# Patient Record
Sex: Female | Born: 1950 | Race: White | Hispanic: No | Marital: Married | Smoking: Former smoker
Health system: Southern US, Community
[De-identification: ages and names within clinical notes are randomized; demographics above are authoritative.]

## PROBLEM LIST (undated history)

## (undated) DIAGNOSIS — IMO0002 Reserved for concepts with insufficient information to code with codable children: Secondary | ICD-10-CM

## (undated) DIAGNOSIS — R911 Solitary pulmonary nodule: Secondary | ICD-10-CM

## (undated) DIAGNOSIS — E785 Hyperlipidemia, unspecified: Secondary | ICD-10-CM

## (undated) DIAGNOSIS — I10 Essential (primary) hypertension: Secondary | ICD-10-CM

## (undated) DIAGNOSIS — N200 Calculus of kidney: Secondary | ICD-10-CM

## (undated) DIAGNOSIS — K219 Gastro-esophageal reflux disease without esophagitis: Secondary | ICD-10-CM

## (undated) DIAGNOSIS — T4145XA Adverse effect of unspecified anesthetic, initial encounter: Secondary | ICD-10-CM

## (undated) DIAGNOSIS — T8859XA Other complications of anesthesia, initial encounter: Secondary | ICD-10-CM

## (undated) DIAGNOSIS — K449 Diaphragmatic hernia without obstruction or gangrene: Secondary | ICD-10-CM

## (undated) DIAGNOSIS — K7581 Nonalcoholic steatohepatitis (NASH): Secondary | ICD-10-CM

## (undated) DIAGNOSIS — R7989 Other specified abnormal findings of blood chemistry: Secondary | ICD-10-CM

## (undated) DIAGNOSIS — I5189 Other ill-defined heart diseases: Secondary | ICD-10-CM

## (undated) DIAGNOSIS — I700 Atherosclerosis of aorta: Secondary | ICD-10-CM

## (undated) DIAGNOSIS — R945 Abnormal results of liver function studies: Secondary | ICD-10-CM

## (undated) DIAGNOSIS — R112 Nausea with vomiting, unspecified: Secondary | ICD-10-CM

## (undated) DIAGNOSIS — L719 Rosacea, unspecified: Secondary | ICD-10-CM

## (undated) DIAGNOSIS — Z9889 Other specified postprocedural states: Secondary | ICD-10-CM

## (undated) HISTORY — DX: Solitary pulmonary nodule: R91.1

## (undated) HISTORY — PX: SPINE SURGERY: SHX786

## (undated) HISTORY — DX: Rosacea, unspecified: L71.9

## (undated) HISTORY — DX: Calculus of kidney: N20.0

## (undated) HISTORY — PX: CHOLECYSTECTOMY: SHX55

## (undated) HISTORY — DX: Reserved for concepts with insufficient information to code with codable children: IMO0002

## (undated) HISTORY — DX: Nonalcoholic steatohepatitis (NASH): K75.81

## (undated) HISTORY — PX: DILATION AND CURETTAGE, DIAGNOSTIC / THERAPEUTIC: SUR384

## (undated) HISTORY — DX: Gastro-esophageal reflux disease without esophagitis: K21.9

## (undated) HISTORY — DX: Essential (primary) hypertension: I10

## (undated) HISTORY — DX: Hyperlipidemia, unspecified: E78.5

## (undated) HISTORY — DX: Other ill-defined heart diseases: I51.89

## (undated) HISTORY — PX: ESOPHAGOGASTRODUODENOSCOPY: SHX1529

## (undated) HISTORY — PX: BREAST SURGERY: SHX581

## (undated) HISTORY — DX: Other specified abnormal findings of blood chemistry: R79.89

## (undated) HISTORY — DX: Diaphragmatic hernia without obstruction or gangrene: K44.9

## (undated) HISTORY — PX: FRACTURE SURGERY: SHX138

## (undated) HISTORY — DX: Atherosclerosis of aorta: I70.0

## (undated) HISTORY — DX: Abnormal results of liver function studies: R94.5

---

## 1998-03-30 ENCOUNTER — Emergency Department (HOSPITAL_COMMUNITY): Admission: EM | Admit: 1998-03-30 | Discharge: 1998-03-30 | Payer: Self-pay | Admitting: Emergency Medicine

## 1999-02-13 ENCOUNTER — Other Ambulatory Visit: Admission: RE | Admit: 1999-02-13 | Discharge: 1999-02-13 | Payer: Self-pay | Admitting: Obstetrics and Gynecology

## 1999-03-17 ENCOUNTER — Ambulatory Visit (HOSPITAL_COMMUNITY): Admission: RE | Admit: 1999-03-17 | Discharge: 1999-03-17 | Payer: Self-pay | Admitting: Internal Medicine

## 1999-03-17 ENCOUNTER — Encounter: Payer: Self-pay | Admitting: Internal Medicine

## 1999-04-14 ENCOUNTER — Encounter: Payer: Self-pay | Admitting: Surgery

## 1999-04-14 ENCOUNTER — Ambulatory Visit (HOSPITAL_COMMUNITY): Admission: RE | Admit: 1999-04-14 | Discharge: 1999-04-14 | Payer: Self-pay | Admitting: Surgery

## 2000-02-26 ENCOUNTER — Other Ambulatory Visit: Admission: RE | Admit: 2000-02-26 | Discharge: 2000-02-26 | Payer: Self-pay | Admitting: Obstetrics and Gynecology

## 2001-07-07 ENCOUNTER — Other Ambulatory Visit: Admission: RE | Admit: 2001-07-07 | Discharge: 2001-07-07 | Payer: Self-pay | Admitting: Obstetrics and Gynecology

## 2001-07-08 ENCOUNTER — Ambulatory Visit (HOSPITAL_COMMUNITY): Admission: RE | Admit: 2001-07-08 | Discharge: 2001-07-08 | Payer: Self-pay | Admitting: Urology

## 2001-07-08 ENCOUNTER — Encounter: Payer: Self-pay | Admitting: Urology

## 2001-07-21 ENCOUNTER — Ambulatory Visit (HOSPITAL_COMMUNITY): Admission: RE | Admit: 2001-07-21 | Discharge: 2001-07-21 | Payer: Self-pay | Admitting: Family Medicine

## 2001-07-21 ENCOUNTER — Encounter: Payer: Self-pay | Admitting: Family Medicine

## 2001-09-07 ENCOUNTER — Encounter: Payer: Self-pay | Admitting: Neurosurgery

## 2001-09-09 ENCOUNTER — Inpatient Hospital Stay (HOSPITAL_COMMUNITY): Admission: RE | Admit: 2001-09-09 | Discharge: 2001-09-10 | Payer: Self-pay | Admitting: Neurosurgery

## 2001-09-09 ENCOUNTER — Encounter: Payer: Self-pay | Admitting: Neurosurgery

## 2001-10-18 ENCOUNTER — Encounter: Admission: RE | Admit: 2001-10-18 | Discharge: 2001-12-08 | Payer: Self-pay | Admitting: Neurosurgery

## 2003-07-07 HISTORY — PX: BREAST CYST ASPIRATION: SHX578

## 2005-11-04 ENCOUNTER — Encounter: Payer: Self-pay | Admitting: Emergency Medicine

## 2006-07-06 HISTORY — PX: COLONOSCOPY: SHX174

## 2007-03-28 ENCOUNTER — Ambulatory Visit (HOSPITAL_COMMUNITY): Admission: RE | Admit: 2007-03-28 | Discharge: 2007-03-28 | Payer: Self-pay | Admitting: Obstetrics and Gynecology

## 2007-03-28 ENCOUNTER — Encounter (INDEPENDENT_AMBULATORY_CARE_PROVIDER_SITE_OTHER): Payer: Self-pay | Admitting: Obstetrics and Gynecology

## 2008-09-14 ENCOUNTER — Ambulatory Visit (HOSPITAL_COMMUNITY): Admission: RE | Admit: 2008-09-14 | Discharge: 2008-09-14 | Payer: Self-pay | Admitting: Family Medicine

## 2010-07-27 ENCOUNTER — Encounter: Payer: Self-pay | Admitting: Obstetrics and Gynecology

## 2010-08-22 ENCOUNTER — Other Ambulatory Visit: Payer: Self-pay | Admitting: Family Medicine

## 2010-08-22 DIAGNOSIS — R221 Localized swelling, mass and lump, neck: Secondary | ICD-10-CM

## 2010-08-27 ENCOUNTER — Ambulatory Visit (HOSPITAL_COMMUNITY)
Admission: RE | Admit: 2010-08-27 | Discharge: 2010-08-27 | Disposition: A | Discharge status: Home / Self Care | Payer: PRIVATE HEALTH INSURANCE | Source: Ambulatory Visit | Attending: Family Medicine | Admitting: Family Medicine

## 2010-08-27 DIAGNOSIS — R220 Localized swelling, mass and lump, head: Secondary | ICD-10-CM | POA: Insufficient documentation

## 2010-08-27 DIAGNOSIS — R221 Localized swelling, mass and lump, neck: Secondary | ICD-10-CM

## 2010-10-01 ENCOUNTER — Encounter: Payer: Self-pay | Admitting: Physician Assistant

## 2010-10-01 DIAGNOSIS — K449 Diaphragmatic hernia without obstruction or gangrene: Secondary | ICD-10-CM | POA: Insufficient documentation

## 2010-10-01 DIAGNOSIS — R221 Localized swelling, mass and lump, neck: Secondary | ICD-10-CM

## 2010-10-01 DIAGNOSIS — G4700 Insomnia, unspecified: Secondary | ICD-10-CM | POA: Insufficient documentation

## 2010-11-18 NOTE — Op Note (Signed)
Denise Meadows, Denise Meadows                ACCOUNT NO.:  [id number]   MEDICAL RECORD NO.:  [id number]          PATIENT TYPE:  AMB   LOCATION:  SDC                           FACILITY:  [facility]   PHYSICIAN:  Malva Limes, M.D.    DATE OF BIRTH:  [birth year]   DATE OF PROCEDURE:  03/28/2007  DATE OF DISCHARGE:                               OPERATIVE REPORT   PREOPERATIVE DIAGNOSIS:  Postmenopausal bleeding.   POSTOPERATIVE DIAGNOSIS:  Postmenopausal bleeding.   PROCEDURE:  1. Fractional dilatation and curettage.  2. Hysteroscopy.   SURGEON:  Malva Limes, M.D.   ANESTHESIA:  MAC with paracervical block.   ANTIBIOTICS:  Ancef 1 gram.   ESTIMATED BLOOD LOSS:  Minimal.   SPECIMENS:  Endometrial and endocervical curettings sent to pathology.   COMPLICATIONS:  None.   DESCRIPTION OF PROCEDURE:  The patient was taken to the operating room  where she was placed in the dorsal supine position.  MAC anesthesia was  administered.  She was then placed in the dorsal lithotomy position.  She was prepped with Betadine and draped in the usual fashion for this  procedure.  A sterile speculum was placed in the vagina.  20 mL of 1%  lidocaine was used for a paracervical block.  The cervix was serially  dilated to a 67 [place].  The hysteroscope was advanced into the uterine  cavity.  The endocervical canal appeared to be normal, however, it was  felt that there may be a posterior fibroid.  The ostia were visualized  with ease.  There was no evidence of any polyps.  The lining of the  uterus appeared to have extensive vasculature, however, it was not  excessively thickened.  At this point the hysteroscope was removed and a  fractional D&C was performed.  First the endocervical curettage was done  followed by the endometrial curettage.  All specimens were sent to  pathology.  This concluded the procedure.  The patient was taken to the  recovery room in stable condition.  Instrument and lap counts were  correct x1.  The patient will be discharged to home.  She will follow up  in the office in four weeks.  She will be sent home with Advil as  needed.           ______________________________  Malva Limes, M.D.    MA/MEDQ  D:  03/28/2007  T:  03/28/2007  Job:  [id number]

## 2010-11-21 NOTE — H&P (Signed)
[place]  Patient:    Denise Meadows, Denise Meadows Visit Number: [id number] MRN: [id number]          Service Type: SUR Location: 3000 3005 [id number] Attending Physician:  Danella Penton Dictated by:   Tanya Nones. Jeral Fruit, M.D. Admit Date:  09/09/2001                           History and Physical  HISTORY OF PRESENT ILLNESS:  Denise Meadows is a lady who was seen by me almost two months ago because of back pain with radiation down into the left leg associated with numbness which involved the second and third toe.  The patient is getting weak.  She was quite miserable.  I saw her in my office, and I advised surgery but she declined.  Nevertheless, she has continued to get worse up to the point that she is having almost a foot-drop.  Finally she decided to have the surgery.  She is being admitted today for surgical intervention of the L5-S1.  PAST MEDICAL HISTORY:  Cholecystectomy in 1992.  ALLERGIES:  PENICILLIN and TYLENOL NO. 3.  SOCIAL HISTORY:  The patient does not smoke.  She drinks socially.  FAMILY HISTORY:  Unremarkable.  REVIEW OF SYSTEMS:  High blood pressure, blood in the urine and leg pain.  PHYSICAL EXAMINATION:  GENERAL:  The patient came to my office with her husband.  She was limping from the left leg.  She had difficulty sitting.  VITAL SIGNS:  She is 5 feet 3 inches tall and weighs 157 pounds.  HEAD/EARS/NOSE/THROAT:  Normal.  NECK:  Normal.  LUNGS:  Clear.  HEART:  Heart sounds normal.  ABDOMEN:  Normal.  EXTREMITIES:  Normal pulses.  NEUROLOGIC:  Mental status normal.  Cranial nerves normal. Strength 5/5 except she has an almost complete foot-drop on the left side.  She has 1/5 dorsi- and plantar flexion.  There is no atrophy and this neurovascular ______ ______  significant with an absent left ankle jerk.  Coordination normal. Sensation showed decrease and numbness in the S1 nerve root.  MRI:  She has a herniated disk  at the L5-S1 central to left with a fragment. 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  By radiographic findings, she has central disk at L5-6 and L6-7 which is asymptomatic.  RECOMMENDATION:  The patient is being admitted for surgery.  We have been already with almost two months of foot-drop as the patient had been reluctant to go ahead with surgery.  We are going to proceed with L5-S1 diskectomy.  The risks, of course, are no improvement whatsoever of the weakness that she has, especially because of chronicity, worsening of the pain, worsening of the weakness, infection, CSF leak, and need of further surgery. Dictated by:   Tanya Nones. Jeral Fruit, M.D. Attending Physician:  Danella Penton DD:  09/09/01 TD:  09/09/01 Job: [id number]

## 2010-11-21 NOTE — Op Note (Signed)
[place]  Patient:    Denise Meadows, Denise Meadows Visit Number: [id number] MRN: [id number]          Service Type: SUR Location: 3000 3005 01 Attending Physician:  Danella Penton Dictated by:   Tanya Nones. Jeral Fruit, M.D. Proc. 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  POSTOPERATIVE DIAGNOSIS:  Left L5-S1 herniated disk with a free fragment spondylosis, foot drop.  PROCEDURE:  Left L5-S1 laminotomy, removal of 5-6 free fragments.  Microscope.  SURGEON:  Tanya Nones. Jeral Fruit, M.D.  ASSISTANT:  Payton Doughty, M.D.  CLINICAL HISTORY:  [name] is a lady who was seen by me about two months ago with the history of back and left leg pain.  By the time I saw her, she almost had a foot drop.  I advised immediate surgery, but she declined.  She wanted to continue with conservative treatment.  She came back again to see me a month later and clinically she was worse.  Nevertheless, she was reluctant to proceed with surgery but at the end, in view of no improvement, she wanted the surgery.  The risks of the surgery were fully explained including infection, CSF leak, but first with the possibility of the weakness that she has in the left foot will never improve and second that because of the spondylosis, there is a high possibility that she might require further surgery which includes fusion.  DESCRIPTION OF PROCEDURE:  The patient was take to the OR, and she was positioned in the prone manner.  The back was prepped with Betadine.  Because of the size, it was difficult to palpate the spinous process.  With a needle, an x-ray was taken which showed that, indeed, we were at the level of L5-S1. We made a midline incision through the skin down to the fascia.  The fascia was opened, and muscles  were retracted laterally.  Indeed, we were at the level of L5-S1.  The facet was loose, and we too every care to preserve the facet joint.  Laminotomy removing part of the lower part of L5 and the upper of S1 was made, and a thick yellow ligament was also excised.  We brought the microscope into the area.  We did a foraminotomy, and we followed the S1 nerve root.  The S1 nerve root was a little bit more yellow than normal.  Then we retracted the dural sac and, indeed, we found like 4-5 fragments going only to the axilla of the S1 nerve root but going up into L5.  At we removed the fragments, we investigated the disk space.  It had been completely sealed, and there was no need to do any diskectomy.  Indeed, we found the facet was loose, but we have x-ray prior to surgery on which flexion-extension, it did not move.  Having done this, Valsalva maneuver was negative.  Fentanyl and Depo-Medrol were left in the dural space, and the wound was closed with Vicryl and Steri-Strips. Dictated by:   Tanya Nones. Jeral Fruit, M.D. Attending Physician:  Danella Penton DD:  09/09/01 TD:  09/11/01 Job: [id number]

## 2011-04-16 LAB — HEPATIC FUNCTION PANEL
Bilirubin, Direct: 0.1
Indirect Bilirubin: 0.5
Total Protein: 7.4

## 2011-04-16 LAB — CBC
HCT: 42.8
MCV: 93
Platelets: 353
RDW: 12.5

## 2011-06-10 ENCOUNTER — Other Ambulatory Visit: Payer: Self-pay | Admitting: Obstetrics and Gynecology

## 2011-12-17 ENCOUNTER — Other Ambulatory Visit: Payer: Self-pay | Admitting: Family Medicine

## 2011-12-17 DIAGNOSIS — K219 Gastro-esophageal reflux disease without esophagitis: Secondary | ICD-10-CM

## 2011-12-17 DIAGNOSIS — R1310 Dysphagia, unspecified: Secondary | ICD-10-CM

## 2011-12-17 DIAGNOSIS — R109 Unspecified abdominal pain: Secondary | ICD-10-CM

## 2011-12-18 ENCOUNTER — Ambulatory Visit (HOSPITAL_COMMUNITY)
Admission: RE | Admit: 2011-12-18 | Discharge: 2011-12-18 | Disposition: A | Discharge status: Home / Self Care | Payer: PRIVATE HEALTH INSURANCE | Source: Ambulatory Visit | Attending: Family Medicine | Admitting: Family Medicine

## 2011-12-18 DIAGNOSIS — R109 Unspecified abdominal pain: Secondary | ICD-10-CM

## 2011-12-18 DIAGNOSIS — K449 Diaphragmatic hernia without obstruction or gangrene: Secondary | ICD-10-CM | POA: Insufficient documentation

## 2011-12-18 DIAGNOSIS — K219 Gastro-esophageal reflux disease without esophagitis: Secondary | ICD-10-CM | POA: Insufficient documentation

## 2011-12-18 DIAGNOSIS — R1310 Dysphagia, unspecified: Secondary | ICD-10-CM

## 2011-12-25 ENCOUNTER — Encounter: Payer: Self-pay | Admitting: Urgent Care

## 2012-01-12 ENCOUNTER — Telehealth: Payer: Self-pay

## 2012-01-12 NOTE — Telephone Encounter (Signed)
Pt called wanting to know about her appointment. Had questions about the screening colonoscopy and the charges. I explained how that it is a screening until they get in there to see what is going on. She understood that.

## 2012-01-18 ENCOUNTER — Encounter: Payer: Self-pay | Admitting: Urgent Care

## 2012-01-18 ENCOUNTER — Other Ambulatory Visit: Payer: Self-pay | Admitting: Gastroenterology

## 2012-01-18 ENCOUNTER — Ambulatory Visit (INDEPENDENT_AMBULATORY_CARE_PROVIDER_SITE_OTHER): Payer: PRIVATE HEALTH INSURANCE | Admitting: Urgent Care

## 2012-01-18 VITALS — BP 130/82 | HR 75 | Temp 98.0°F | Ht 61.0 in | Wt 147.2 lb

## 2012-01-18 DIAGNOSIS — K219 Gastro-esophageal reflux disease without esophagitis: Secondary | ICD-10-CM

## 2012-01-18 DIAGNOSIS — R1310 Dysphagia, unspecified: Secondary | ICD-10-CM

## 2012-01-18 DIAGNOSIS — Z1211 Encounter for screening for malignant neoplasm of colon: Secondary | ICD-10-CM

## 2012-01-18 NOTE — Patient Instructions (Addendum)
Continue Prevacid 30 mg before breakfast and dinner. You will need an EGD (upper endoscopy) with Dr. Darrick Penna. She may decide to use "stretch" your esophagus while she is doing the procedure. We will request your last colonoscopy report from [facility] and determine timing of your next screening colonoscopy.  Diet for GERD or PUD Nutrition therapy can help ease the discomfort of gastroesophageal reflux disease (GERD) and peptic ulcer disease (PUD).  HOME CARE INSTRUCTIONS   Eat your meals slowly, in a relaxed setting.   Eat 5 to 6 small meals per day.   If a food causes distress, stop eating it for a period of time.  FOODS TO AVOID  Coffee, regular or decaffeinated.   Cola beverages, regular or low calorie.   Tea, regular or decaffeinated.   Pepper.   Cocoa.   High fat foods, including meats.   Butter, margarine, hydrogenated oil (trans fats).   Peppermint or spearmint (if you have GERD).   Fruits and vegetables if not tolerated.   Alcohol.   Nicotine (smoking or chewing). This is one of the most potent stimulants to acid production in the gastrointestinal tract.   Any food that seems to aggravate your condition.  If you have questions regarding your diet, ask your caregiver or a registered dietitian. TIPS  Lying flat may make symptoms worse. Keep the head of your bed raised 6 to 9 inches (15 to 23 cm) by using a foam wedge or blocks under the legs of the bed.   Do not lay down until 3 hours after eating a meal.   Daily physical activity may help reduce symptoms.  MAKE SURE YOU:   Understand these instructions.   Will watch your condition.   Will get help right away if you are not doing well or get worse.  Document Released: 06/22/2005 Document Revised: 06/11/2011 Document Reviewed: 05/08/2011 [facility] Patient Information 2012 [address], [place].

## 2012-01-18 NOTE — Progress Notes (Addendum)
Referring Provider: Dr. Modesto Charon Primary Care Physician:  Frazier Richards, MD/Dr Modesto Charon Primary Gastroenterologist:  Dr. Jonette Eva  Chief Complaint  Patient presents with  . EGD  . Dysphagia    HPI:  Denise Meadows is a 61 y.o. female here as a referral from Dr. Modesto Charon for severe indigestion & dysphagia.  She was in her usual state of health until about a month or so ago.  She was visiting her son & began to have severe indigestion.  She took prevacid & then  2 rolaids.  Nothing seemed to help.  She took another prevacid & was no better.  She was unable to  sleep.  She called her MD the next day & he called in [facility]? But cannot remember the name.  She was feeling a lot better by next day.  She had severe odynophagia w/ solids & liquids.  Intermittent dysphagia w/ solids & liquids & severe chest pain w/ swallowing.  No Regurgiation.  She is now on Prevacid 30mg  BID.  She has had  2 more episodes & took Generic OTC soft chews since starting prevacid BID.  She has been watching what she eats including spicy foods, corn, & greasy foods.   6/14 UGI w/ KUB-Significant gastroesophageal reflux to the level of the clavicular heads.   Tiny sliding hiatal hernia.  Suspect subtle nonobstructing Schatzki ring distal esophagus, inconsistently visualized. Otherwise negative exam.   Past Medical History  Diagnosis Date  . Diabetes mellitus   . Hyperlipidemia   . Hypertension   . GERD (gastroesophageal reflux disease)   . Hiatal hernia   . Hiatal hernia   . Rosacea   . Nephrolithiasis   . Pulmonary nodule     no change-1995  . Elevated liver function tests     eval by dr Jettie Pagan to fatty liver 2008  . Ulcer     gastric ulcer, antritis-egd 09/06/08  . Vitamin d deficiency   . NASH (nonalcoholic steatohepatitis)   . DDD (degenerative disc disease)     Past Surgical History  Procedure Date  . Breast surgery     benign breast nodules 2005  . Cholecystectomy   . Fracture surgery   . Spine  surgery   . Esophagogastroduodenoscopy   . Colonoscopy 2008    Dr Harvie Junior    Current Outpatient Prescriptions  Medication Sig Dispense Refill  . aspirin 81 MG tablet Take 81 mg by mouth daily.      . Cholecalciferol (VITAMIN D) 2000 UNITS tablet Take 2,000 Units by mouth 2 (two) times daily.       . metFORMIN (GLUCOPHAGE) 1000 MG tablet Take 1,000 mg by mouth 2 (two) times daily with a meal.        . metoprolol succinate (TOPROL-XL) 25 MG 24 hr tablet Take 25 mg by mouth daily.        . ramipril (ALTACE) 10 MG capsule Take 10 mg by mouth daily.        . simvastatin (ZOCOR) 80 MG tablet Take 80 mg by mouth at bedtime.          Allergies as of 01/18/2012 - Review Complete 01/18/2012  Allergen Reaction Noted  . Codeine Nausea And Vomiting 10/01/2010  . Penicillins Rash 10/01/2010    Family History  Problem Relation Age of Onset  . Hyperlipidemia Mother   . COPD Father   . Heart disease Neg Hx   . Colon cancer Maternal Aunt   . Colon cancer Maternal Grandfather  History   Social History  . Marital Status: Married    Spouse Name: N/A    Number of Children: 2  . Years of Education: N/A   Occupational History  . homemaker    Social History Main Topics  . Smoking status: Former Smoker    Types: Cigarettes    Quit date: 07/06/1978  . Smokeless tobacco: Not on file   Comment: intermittent for a few months  . Alcohol Use: Yes     occasional mixed drink couple per mo  . Drug Use: No  . Sexually Active: Not on file   Other Topics Concern  . Not on file   Social History Narrative   Lives w/ husband    Review of Systems: Gen: Denies any fever, chills, sweats, anorexia, fatigue, weakness, malaise, weight loss, and sleep disorder CV: Denies chest pain, angina, palpitations, syncope, orthopnea, PND, peripheral edema, and claudication. Resp: Denies dyspnea at rest, dyspnea with exercise, cough, sputum, wheezing, coughing up blood, and pleurisy. GI: Denies  vomiting blood, jaundice, and fecal incontinence.   GU : Denies urinary burning, blood in urine, urinary frequency, urinary hesitancy, nocturnal urination, and urinary incontinence. MS: Denies joint pain, limitation of movement, and swelling, stiffness, low back pain, extremity pain. Denies muscle weakness, cramps, atrophy.  Derm: Denies rash, itching, dry skin, hives, moles, warts, or unhealing ulcers.  Psych: Denies depression, anxiety, memory loss, suicidal ideation, hallucinations, paranoia, and confusion. Heme: Denies bruising, bleeding, and enlarged lymph nodes. Neuro:  Denies any headaches, dizziness, paresthesias. Endo:  Denies any problems with DM, thyroid, adrenal function.  Physical Exam: BP 130/82  Pulse 75  Temp 98 F (36.7 C) (Temporal)  Ht 5\' 1"  (1.549 m)  Wt 147 lb 3.2 oz (66.769 kg)  BMI 27.81 kg/m2 General:   Alert,  Well-developed, well-nourished, pleasant and cooperative in NAD. Head:  Normocephalic and atraumatic. Eyes:  Sclera clear, no icterus.   Conjunctiva pink. Ears:  Normal auditory acuity. Nose:  No deformity, discharge, or lesions. Mouth:  No deformity or lesions,oropharynx pink & moist. Neck:  Supple; no masses or thyromegaly. Lungs:  Clear throughout to auscultation.   No wheezes, crackles, or rhonchi. No acute distress. Heart:  Regular rate and rhythm; no murmurs, clicks, rubs,  or gallops. Abdomen:  Normal bowel sounds.  No bruits.  Soft, non-tender and non-distended without masses, hepatosplenomegaly or hernias noted.  No guarding or rebound tenderness.   Rectal:  Deferred. Msk:  Symmetrical without gross deformities. Normal posture. Pulses:  Normal pulses noted. Extremities:  No clubbing or edema. Neurologic:  Alert and oriented x4;  grossly normal neurologically. Skin:  Intact without significant lesions or rashes. Lymph Nodes:  No significant cervical adenopathy. Psych:  Alert and cooperative. Normal mood and affect.;t

## 2012-01-18 NOTE — Assessment & Plan Note (Signed)
Denise Meadows is a pleasant 61 y.o. female referred for dysphagia, odynophagia & poorly controlled GERD.  UGI suggests possible esophageal ring.  Will need EGD with possible esophageal dilation with Dr. Darrick Penna to determine source of symptoms.  Differentials include erosive/ulcerative esophagitis, ring/web, or mass.  I have discussed risks & benefits which include, but are not limited to, bleeding, infection, perforation & drug reaction.  The patient agrees with this plan & written consent will be obtained.    Continue Prevacid 30mg  BID GERD diet

## 2012-01-18 NOTE — Assessment & Plan Note (Signed)
Pt believes she may be due for colonoscopy. Will request most recent to determine timing.  No FH colon Ca or personal hx polyps reported.

## 2012-01-19 ENCOUNTER — Telehealth: Payer: Self-pay | Admitting: Urgent Care

## 2012-01-19 NOTE — Progress Notes (Signed)
Faxed to PCP, Frazier Richards, PA

## 2012-01-19 NOTE — Telephone Encounter (Signed)
Confirmed information with Rene Kocher at Dr [facility] office, she will send reports soon.

## 2012-01-19 NOTE — Telephone Encounter (Signed)
Please re-request colonsocpy & any biopsy reports from 2005-2010 from Dr Kinnie Scales [facility]) Thanks

## 2012-01-19 NOTE — Telephone Encounter (Signed)
Left a message for Rene Kocher with Dr Kinnie Scales to call me back to get her records

## 2012-01-21 NOTE — Telephone Encounter (Signed)
Did we get colonoscopy reports?

## 2012-01-21 NOTE — Telephone Encounter (Signed)
I asked Ginger yesterday and she said we got what we needed.

## 2012-01-22 ENCOUNTER — Encounter: Payer: Self-pay | Admitting: Urgent Care

## 2012-01-22 ENCOUNTER — Encounter (HOSPITAL_COMMUNITY): Payer: Self-pay | Admitting: Pharmacy Technician

## 2012-01-22 ENCOUNTER — Other Ambulatory Visit: Payer: Self-pay | Admitting: Gastroenterology

## 2012-01-22 DIAGNOSIS — Z800 Family history of malignant neoplasm of digestive organs: Secondary | ICD-10-CM

## 2012-01-22 DIAGNOSIS — R1310 Dysphagia, unspecified: Secondary | ICD-10-CM

## 2012-01-22 MED ORDER — PEG-KCL-NACL-NASULF-NA ASC-C 100 G PO SOLR: 1.0000 | Freq: Once | ORAL | Status: DC

## 2012-01-22 NOTE — Progress Notes (Signed)
Colonoscopy report received from Dr Kinnie Scales.  Discussed w/ pt.  He recommended 5 yr screening due to 2 2nd degree family members w/ colon ca. Please add colonoscopy at the same time as EGD if possible.  Pt agrees w/ plan. Thanks

## 2012-01-22 NOTE — Progress Notes (Signed)
Colonoscopy has been added to EGD and instructions were mailed to the patient and she is aware

## 2012-01-25 NOTE — Telephone Encounter (Signed)
Report reviewed

## 2012-01-28 ENCOUNTER — Telehealth: Payer: Self-pay | Admitting: Gastroenterology

## 2012-01-28 NOTE — Telephone Encounter (Signed)
Routing to Lorenza Burton, NP who saw pt in office on 01/18/2012 for advice.

## 2012-01-28 NOTE — Telephone Encounter (Signed)
She should take w/ food.  Common side effect.  If continues, she should mention to PCP.  Can hold metformin day before & of procedure until after procedure. Thanks

## 2012-01-28 NOTE — Telephone Encounter (Signed)
Called and informed pt.  

## 2012-01-28 NOTE — Telephone Encounter (Signed)
Ms. Carreras has a procedure scheduled with Dr. Darrick Penna on 08/06 and she is concerned about her metformin she says that if she dont eat when she takes it she gets diarrhea please advise?

## 2012-02-08 ENCOUNTER — Other Ambulatory Visit: Payer: Self-pay | Admitting: Gastroenterology

## 2012-02-08 ENCOUNTER — Telehealth: Payer: Self-pay | Admitting: Gastroenterology

## 2012-02-08 ENCOUNTER — Ambulatory Visit: Admit: 2012-02-08 | Payer: Self-pay | Admitting: Gastroenterology

## 2012-02-08 ENCOUNTER — Encounter (HOSPITAL_COMMUNITY): Payer: Self-pay | Admitting: *Deleted

## 2012-02-08 ENCOUNTER — Ambulatory Visit (HOSPITAL_COMMUNITY)
Admission: RE | Admit: 2012-02-08 | Discharge: 2012-02-08 | Disposition: A | Discharge status: Home / Self Care | Payer: PRIVATE HEALTH INSURANCE | Source: Ambulatory Visit | Attending: Gastroenterology | Admitting: Gastroenterology

## 2012-02-08 ENCOUNTER — Encounter (HOSPITAL_COMMUNITY)
Admission: RE | Disposition: A | Discharge status: Home / Self Care | Payer: Self-pay | Source: Ambulatory Visit | Attending: Gastroenterology

## 2012-02-08 DIAGNOSIS — Z01812 Encounter for preprocedural laboratory examination: Secondary | ICD-10-CM | POA: Insufficient documentation

## 2012-02-08 DIAGNOSIS — K648 Other hemorrhoids: Secondary | ICD-10-CM | POA: Insufficient documentation

## 2012-02-08 DIAGNOSIS — E785 Hyperlipidemia, unspecified: Secondary | ICD-10-CM | POA: Insufficient documentation

## 2012-02-08 DIAGNOSIS — I10 Essential (primary) hypertension: Secondary | ICD-10-CM | POA: Insufficient documentation

## 2012-02-08 DIAGNOSIS — Z800 Family history of malignant neoplasm of digestive organs: Secondary | ICD-10-CM

## 2012-02-08 DIAGNOSIS — Q393 Congenital stenosis and stricture of esophagus: Secondary | ICD-10-CM | POA: Insufficient documentation

## 2012-02-08 DIAGNOSIS — Z1211 Encounter for screening for malignant neoplasm of colon: Secondary | ICD-10-CM | POA: Insufficient documentation

## 2012-02-08 DIAGNOSIS — K5730 Diverticulosis of large intestine without perforation or abscess without bleeding: Secondary | ICD-10-CM

## 2012-02-08 DIAGNOSIS — R1310 Dysphagia, unspecified: Secondary | ICD-10-CM

## 2012-02-08 DIAGNOSIS — K269 Duodenal ulcer, unspecified as acute or chronic, without hemorrhage or perforation: Secondary | ICD-10-CM | POA: Insufficient documentation

## 2012-02-08 DIAGNOSIS — K222 Esophageal obstruction: Secondary | ICD-10-CM

## 2012-02-08 DIAGNOSIS — Q391 Atresia of esophagus with tracheo-esophageal fistula: Secondary | ICD-10-CM | POA: Insufficient documentation

## 2012-02-08 DIAGNOSIS — E119 Type 2 diabetes mellitus without complications: Secondary | ICD-10-CM | POA: Insufficient documentation

## 2012-02-08 HISTORY — DX: Nausea with vomiting, unspecified: R11.2

## 2012-02-08 HISTORY — PX: MALONEY DILATION: SHX5535

## 2012-02-08 HISTORY — PX: SAVORY DILATION: SHX5439

## 2012-02-08 HISTORY — DX: Other complications of anesthesia, initial encounter: T88.59XA

## 2012-02-08 HISTORY — DX: Adverse effect of unspecified anesthetic, initial encounter: T41.45XA

## 2012-02-08 HISTORY — DX: Other specified postprocedural states: Z98.890

## 2012-02-08 LAB — GLUCOSE, CAPILLARY: Glucose-Capillary: 125 mg/dL — ABNORMAL HIGH (ref 70–99)

## 2012-02-08 SURGERY — EGD (ESOPHAGOGASTRODUODENOSCOPY)
Anesthesia: Moderate Sedation

## 2012-02-08 SURGERY — COLONOSCOPY WITH ESOPHAGOGASTRODUODENOSCOPY (EGD)
Anesthesia: Moderate Sedation

## 2012-02-08 MED ORDER — MEPERIDINE HCL 100 MG/ML IJ SOLN
INTRAMUSCULAR | Status: AC
  Filled 2012-02-08: qty 1

## 2012-02-08 MED ORDER — MINERAL OIL PO OIL
TOPICAL_OIL | ORAL | Status: AC
  Filled 2012-02-08: qty 30

## 2012-02-08 MED ORDER — MIDAZOLAM HCL 5 MG/5ML IJ SOLN
INTRAMUSCULAR | Status: DC | PRN
  Administered 2012-02-08 (×2): 1 mg via INTRAVENOUS
  Administered 2012-02-08: 2 mg via INTRAVENOUS
  Administered 2012-02-08 (×2): 1 mg via INTRAVENOUS

## 2012-02-08 MED ORDER — MIDAZOLAM HCL 5 MG/5ML IJ SOLN
INTRAMUSCULAR | Status: AC
  Filled 2012-02-08: qty 10

## 2012-02-08 MED ORDER — STERILE WATER FOR IRRIGATION IR SOLN
Status: DC | PRN
  Administered 2012-02-08: 09:00:00

## 2012-02-08 MED ORDER — MEPERIDINE HCL 100 MG/ML IJ SOLN
INTRAMUSCULAR | Status: DC | PRN
  Administered 2012-02-08 (×2): 25 mg via INTRAVENOUS
  Administered 2012-02-08: 50 mg via INTRAVENOUS

## 2012-02-08 MED ORDER — SODIUM CHLORIDE 0.45 % IV SOLN
Freq: Once | INTRAVENOUS | Status: AC
  Administered 2012-02-08: 08:00:00 via INTRAVENOUS

## 2012-02-08 NOTE — Telephone Encounter (Signed)
Patient is scheduled for EGD/ED with [facility] on Monday Aug 19th and the instructions were mailed to the patient

## 2012-02-08 NOTE — Op Note (Signed)
[facility] [address] [place], [place]  [address]  ENDOSCOPY PROCEDURE REPORT  PATIENT:  Denise, Meadows  MR#:  [id number] BIRTHDATE:  25-Aug-1950, 60 yrs. old  GENDER:  female  ENDOSCOPIST:  Jonette Eva, MD ASSISTANT: Referred by:  Frazier Richards, M.D.  PROCEDURE DATE:  02/08/2012 PROCEDURE:  EGD with dilatation over guidewire ASA CLASS: INDICATIONS:  dysphagia-SOLIDS/LIQUIDS GERD UNCONTROLLED EGDX2 IN PAST-LAST EGD 201 GASTRIC BX NEG FOR H PYLORI PT HAS NEVER BEEN DIALTED  MEDICATIONS:   TCS +Demerol 25 mg IV, Versed 2 mg IV TOPICAL ANESTHETIC:  Cetacaine Spray  DESCRIPTION OF PROCEDURE:   After the risks benefits and alternatives of the procedure were thoroughly explained, informed consent was obtained.  The EG-[id number] ([id number]) and [id number] ([id number]) endoscope was introduced through the mouth and advanced to the second portion of the duodenum.  The instrument was slowly withdrawn as the mucosa was carefully examined.  Prior to withdrawal of the scope, the guidwire was placed.  The esophagus was dilated successfully.  The patient was recovered in endoscopy and discharged home in satisfactory condition. <<PROCEDUREIMAGES>>  A web was present in the proximal esophagus [facility] WOULD NOT ALLOW THE DIAGNOSTIC GASTROSCOPE TO PASS. CHNAGED TO PEDIATRICS GASTROSCOPE WHICH PASSSED WITHOUT DIFFICULTY.  A PAENT Schatzki's ring was found in the distal esophagus. NL GASTRIC MUCOSA. Multiple 1 MM ulcers were found in the second portion of the duodenum.    Dilation was then performed at the total esophagus  1) Dilator:  Savary over guidewire  Size(s):  10-12.8 mm Resistance:  minimal TO MODERATE  Heme:  none Appearance:  COMPLICATIONS:  None  ENDOSCOPIC IMPRESSION: 1) Web in the proximal esophagus-MOST LIKELY SOURCE OR DYSPHAGIA  2) PATENT Schatzki's ring in the distal esophagus 3) Ulcers, multiple in the second portion duodenum, MOST LIKE DUE TO  ASA  RECOMMENDATIONS: HOLD ASA FOR 3 WEEKS PREVACID BID FOR 3 MOS THEN DAILY REFLUX HO GIVEN SOFT MECHANICAL DIET. MEAT CHOPPED OR GROUND ONLY EGD/DIL AUG 19. WILL NEED GASTRIC BIOPSIES TO EVALUATE FOR H PYLORI OPV IN 4 MOS  REPEAT EXAM:  No  ______________________________ Jonette Eva, MD  CC:  n. eSIGNED:   Romone Shaff at 02/08/2012 10:09 AM  Raenette Rover, [phone]

## 2012-02-08 NOTE — H&P (Addendum)
Primary Care Physician:  Frazier Richards, MD Primary Gastroenterologist:  Dr. Darrick Penna  Pre-Procedure History & Physical: HPI:  Denise Meadows is a 61 y.o. female here for DYSPHAGIA/screening.   Past Medical History  Diagnosis Date  . Diabetes mellitus   . Hyperlipidemia   . Hypertension   . GERD (gastroesophageal reflux disease)   . Hiatal hernia   . Hiatal hernia   . Rosacea   . Nephrolithiasis   . Pulmonary nodule     no change-1995  . Elevated liver function tests     eval by dr Jettie Pagan to fatty liver 2008  . Ulcer     gastric ulcer, antritis-egd 09/06/08  . Vitamin d deficiency   . NASH (nonalcoholic steatohepatitis)   . DDD (degenerative disc disease)   . Complication of anesthesia   . PONV (postoperative nausea and vomiting)     heel surgery    Past Surgical History  Procedure Date  . Breast surgery     benign breast nodules 2005  . Cholecystectomy   . Fracture surgery   . Spine surgery   . Esophagogastroduodenoscopy   . Colonoscopy 2008    Dr Harvie Junior    Prior to Admission medications   Medication Sig Start Date End Date Taking? Authorizing Provider  Cholecalciferol (VITAMIN D) 2000 UNITS tablet Take 2,000 Units by mouth 2 (two) times daily.    Yes Historical Provider, MD  metoprolol succinate (TOPROL-XL) 25 MG 24 hr tablet Take 25 mg by mouth every evening.    Yes Historical Provider, MD  ramipril (ALTACE) 10 MG capsule Take 10 mg by mouth every evening.    Yes Historical Provider, MD  simvastatin (ZOCOR) 80 MG tablet Take 80 mg by mouth at bedtime.    Yes Historical Provider, MD  aspirin EC 81 MG tablet Take 81 mg by mouth every evening.    Historical Provider, MD  metFORMIN (GLUCOPHAGE) 1000 MG tablet Take 1,000 mg by mouth 2 (two) times daily with a meal.      Historical Provider, MD    Allergies as of 01/22/2012 - Review Complete 01/22/2012  Allergen Reaction Noted  . Codeine Nausea And Vomiting 10/01/2010  . Penicillins Rash 10/01/2010     Family History  Problem Relation Age of Onset  . Hyperlipidemia Mother   . COPD Father   . Heart disease Neg Hx   . Colon cancer Maternal Aunt   . Colon cancer Maternal Grandfather     History   Social History  . Marital Status: Married    Spouse Name: N/A    Number of Children: 2  . Years of Education: N/A   Occupational History  . homemaker    Social History Main Topics  . Smoking status: Former Smoker    Types: Cigarettes    Quit date: 07/06/1978  . Smokeless tobacco: Not on file   Comment: intermittent for a few months  . Alcohol Use: Yes     occasional mixed drink couple per mo  . Drug Use: No  . Sexually Active: Not on file   Other Topics Concern  . Not on file   Social History Narrative   Lives w/ husband    Review of Systems: See HPI, otherwise negative ROS   Physical Exam: BP 150/73  Pulse 82  Temp 97.7 F (36.5 C) (Oral)  Resp 18  SpO2 97% General:   Alert,  pleasant and cooperative in NAD Head:  Normocephalic and atraumatic. Neck:  Supple;  Lungs:  Clear  throughout to auscultation.    Heart:  Regular rate and rhythm. Abdomen:  Soft, nontender and nondistended. Normal bowel sounds, without guarding, and without rebound.   Neurologic:  Alert and  oriented x4;  grossly normal neurologically.  Impression/Plan:     SCREENING/dyshagia  Plan:  1. TCS/egd/dil? TODAY

## 2012-02-08 NOTE — Op Note (Signed)
[facility] [address] [place], [place]  [address]  COLONOSCOPY PROCEDURE REPORT  PATIENT:  Denise Meadows, Denise Meadows  MR#:  [id number] BIRTHDATE:  Dec 13, 1950, 60 yrs. old  GENDER:  female  ENDOSCOPIST:  Jonette Eva, MD REF. BY:  Frazier Richards, M.D. ASSISTANT:  PROCEDURE DATE:  02/08/2012 PROCEDURE:  Colonoscopy 19147  INDICATIONS:  Screening-aunt and grandfather had colon ca. 2 prior TCS-NO POLYPS  MEDICATIONS:   Demerol 75 mg IV, Versed 4 mg IV  DESCRIPTION OF PROCEDURE:    Physical exam was performed. Informed consent was obtained from the patient after explaining the benefits, risks, and alternatives to procedure.  The patient was connected to monitor and placed in left lateral position. Continuous oxygen was provided by nasal cannula and IV medicine administered through an indwelling cannula.  After administration of sedation and rectal exam, the patient's rectum was intubated and the EC-[id number] ([id number]) colonoscope was advanced under direct visualization to the cecum.  The scope was removed slowly by carefully examining the color, texture, anatomy, and integrity mucosa on the way out.  The patient was recovered in endoscopy and discharged home in satisfactory condition. <<PROCEDUREIMAGES>>  FINDINGS:  RARE Diverticula were found in the sigmoid colon. OTHERWISE, normal colonoscopy without polyps, masses, vascular ectasias, or inflammatory changes.  SMALL Internal Hemorrhoids were found.  PREP QUALITY:    EXCELLENT CECAL W/D TIME:      12 minutes  COMPLICATIONS:    None  ENDOSCOPIC IMPRESSION: 1) Internal hemorrhoids 2) MILD DIVERTICULOSIS  RECOMMENDATIONS: HIGH FIBER DIET TCS IN 10 YEARS-5 YEAR INTERVAL NOT REQIRED FOR 2 SECOND DEGREE RELATIVES WITH COLON CA  REPEAT EXAM:  No  ______________________________ Jonette Eva, MD  CC:  Frazier Richards, M.D.  n. eSIGNED:   Socorro Ebron at 02/08/2012 09:31 AM  Raenette Rover, [phone]

## 2012-02-09 ENCOUNTER — Encounter (HOSPITAL_COMMUNITY): Payer: Self-pay | Admitting: Pharmacy Technician

## 2012-02-10 ENCOUNTER — Encounter (HOSPITAL_COMMUNITY): Payer: Self-pay | Admitting: Gastroenterology

## 2012-02-24 NOTE — Progress Notes (Signed)
PLEASE CONTACT PT. SHE NEEDS TO COMPLETE EGD/DIL ASAP.

## 2012-02-29 ENCOUNTER — Telehealth: Payer: Self-pay | Admitting: Gastroenterology

## 2012-02-29 NOTE — Telephone Encounter (Signed)
Patient called to cancel procedure w/[facility] on 08/27 because she cant get transportation

## 2012-03-01 ENCOUNTER — Ambulatory Visit (HOSPITAL_COMMUNITY)
Admission: RE | Admit: 2012-03-01 | Payer: PRIVATE HEALTH INSURANCE | Source: Ambulatory Visit | Admitting: Gastroenterology

## 2012-03-01 ENCOUNTER — Encounter (HOSPITAL_COMMUNITY): Admission: RE | Payer: Self-pay | Source: Ambulatory Visit

## 2012-03-01 SURGERY — ESOPHAGOGASTRODUODENOSCOPY (EGD) WITH ESOPHAGEAL DILATION
Anesthesia: Moderate Sedation

## 2012-05-27 ENCOUNTER — Encounter: Payer: Self-pay | Admitting: *Deleted

## 2012-08-29 ENCOUNTER — Encounter: Payer: Self-pay | Admitting: Family Medicine

## 2012-08-29 DIAGNOSIS — Z87442 Personal history of urinary calculi: Secondary | ICD-10-CM

## 2012-08-29 DIAGNOSIS — E785 Hyperlipidemia, unspecified: Secondary | ICD-10-CM | POA: Insufficient documentation

## 2012-08-29 DIAGNOSIS — E119 Type 2 diabetes mellitus without complications: Secondary | ICD-10-CM | POA: Insufficient documentation

## 2012-08-29 DIAGNOSIS — Z9889 Other specified postprocedural states: Secondary | ICD-10-CM | POA: Insufficient documentation

## 2012-08-29 DIAGNOSIS — L719 Rosacea, unspecified: Secondary | ICD-10-CM | POA: Insufficient documentation

## 2012-08-29 DIAGNOSIS — I10 Essential (primary) hypertension: Secondary | ICD-10-CM

## 2012-08-29 DIAGNOSIS — E1169 Type 2 diabetes mellitus with other specified complication: Secondary | ICD-10-CM | POA: Insufficient documentation

## 2012-08-29 DIAGNOSIS — E1165 Type 2 diabetes mellitus with hyperglycemia: Secondary | ICD-10-CM | POA: Insufficient documentation

## 2012-08-29 DIAGNOSIS — E559 Vitamin D deficiency, unspecified: Secondary | ICD-10-CM | POA: Insufficient documentation

## 2012-11-09 ENCOUNTER — Telehealth: Payer: Self-pay | Admitting: Physician Assistant

## 2012-11-09 ENCOUNTER — Ambulatory Visit (INDEPENDENT_AMBULATORY_CARE_PROVIDER_SITE_OTHER): Payer: BC Managed Care – PPO | Admitting: Physician Assistant

## 2012-11-09 ENCOUNTER — Encounter: Payer: Self-pay | Admitting: Physician Assistant

## 2012-11-09 VITALS — BP 120/60 | HR 78 | Temp 97.9°F | Resp 18 | Ht 62.0 in | Wt 148.0 lb

## 2012-11-09 DIAGNOSIS — E559 Vitamin D deficiency, unspecified: Secondary | ICD-10-CM

## 2012-11-09 DIAGNOSIS — K219 Gastro-esophageal reflux disease without esophagitis: Secondary | ICD-10-CM

## 2012-11-09 DIAGNOSIS — I10 Essential (primary) hypertension: Secondary | ICD-10-CM

## 2012-11-09 DIAGNOSIS — K449 Diaphragmatic hernia without obstruction or gangrene: Secondary | ICD-10-CM

## 2012-11-09 DIAGNOSIS — E785 Hyperlipidemia, unspecified: Secondary | ICD-10-CM

## 2012-11-09 DIAGNOSIS — E119 Type 2 diabetes mellitus without complications: Secondary | ICD-10-CM

## 2012-11-09 LAB — COMPLETE METABOLIC PANEL WITH GFR
ALT: 15 U/L (ref 0–35)
AST: 16 U/L (ref 0–37)
Calcium: 10.4 mg/dL (ref 8.4–10.5)
Chloride: 104 mEq/L (ref 96–112)
Creat: 0.9 mg/dL (ref 0.50–1.10)
Potassium: 5.1 mEq/L (ref 3.5–5.3)
Sodium: 139 mEq/L (ref 135–145)
Total Protein: 7.5 g/dL (ref 6.0–8.3)

## 2012-11-09 LAB — LIPID PANEL
LDL Cholesterol: 47 mg/dL (ref 0–99)
Triglycerides: 264 mg/dL — ABNORMAL HIGH (ref <150)
VLDL: 53 mg/dL — ABNORMAL HIGH (ref 0–40)

## 2012-11-09 LAB — HEMOGLOBIN A1C
Hgb A1c MFr Bld: 7.7 % — ABNORMAL HIGH (ref <5.7)
Mean Plasma Glucose: 174 mg/dL — ABNORMAL HIGH (ref <117)

## 2012-11-09 MED ORDER — METOPROLOL SUCCINATE ER 25 MG PO TB24: 25.0000 mg | ORAL_TABLET | Freq: Every evening | ORAL | Status: DC

## 2012-11-09 NOTE — Telephone Encounter (Signed)
Script called in

## 2012-11-09 NOTE — Progress Notes (Signed)
Patient ID: Denise Meadows MRN: [id number], DOB: February 23, 1951, 62 y.o. Date of Encounter: 11/09/2012, 5:34 PM   Chief Complaint: Diabetes, Hypertension, Hyperlipidemia Follow Up  HPI: 62 y.o. year old female with history below presents for follow up. Diabetes:Taking medications as directed without adverse effects. No polyuria or   polydipsia. Blood sugars at home:She only checks about once per week-fasting a.m.-gets 110-120. Diet consists of: Is careful with her diet. Exercising regularly.Still walks 2.5 miles every day. "Unless it is <20 degrees!"  Hypertension: Taking medications as directed with no adverse effects. No lower extremity swelling.  Hyperlipidemia: Taking medications as directed with no adverse effects. No myalgias.  Past Medical History  Diagnosis Date  . Diabetes mellitus   . Hyperlipidemia   . Hypertension   . GERD (gastroesophageal reflux disease)   . Hiatal hernia   . Hiatal hernia   . Rosacea   . Nephrolithiasis   . Pulmonary nodule     no change-1995  . Elevated liver function tests     eval by dr Jettie Pagan to fatty liver 2008  . Ulcer     gastric ulcer, antritis-egd 09/06/08  . Vitamin D deficiency   . NASH (nonalcoholic steatohepatitis)   . DDD (degenerative disc disease)   . Complication of anesthesia   . PONV (postoperative nausea and vomiting)     heel surgery     Home Meds: Current Outpatient Prescriptions on File Prior to Visit  Medication Sig Dispense Refill  . Cholecalciferol (VITAMIN D) 2000 UNITS tablet Take 2,000 Units by mouth 2 (two) times daily.       . lansoprazole (PREVACID) 30 MG capsule Take 30 mg by mouth 2 (two) times daily as needed.      . metFORMIN (GLUCOPHAGE) 1000 MG tablet Take 1,000 mg by mouth 2 (two) times daily with a meal.        . ramipril (ALTACE) 10 MG capsule Take 10 mg by mouth every evening.       . simvastatin (ZOCOR) 80 MG tablet Take 80 mg by mouth at bedtime.        No current facility-administered  medications on file prior to visit.    Allergies:  Allergies  Allergen Reactions  . Codeine Nausea And Vomiting  . Penicillins Rash    History   Social History  . Marital Status: Married    Spouse Name: N/A    Number of Children: 2  . Years of Education: N/A   Occupational History  . homemaker    Social History Main Topics  . Smoking status: Former Smoker    Types: Cigarettes    Quit date: 07/06/1978  . Smokeless tobacco: Not on file     Comment: intermittent for a few months  . Alcohol Use: Yes     Comment: occasional mixed drink couple per mo  . Drug Use: No  . Sexually Active: Not on file   Other Topics Concern  . Not on file   Social History Narrative   Lives w/ husband     Review of Systems: Constitutional: negative for chills, fever, night sweats, weight changes, or fatigue  HEENT: negative for vision changes, hearing loss, congestion, rhinorrhea, or epistaxis Cardiovascular: negative for chest pain, palpitations, diaphoresis, DOE, orthopnea, or edema Respiratory: negative for hemoptysis, wheezing, shortness of breath, dyspnea, or cough Abdominal: negative for abdominal pain, nausea, vomiting, diarrhea, or constipation Dermatological: negative for rash, erythema, or wounds Neurologic: negative for headache, dizziness, or syncope All other systems reviewed and  are otherwise negative with the exception to those above and in the HPI.   Physical Exam: Blood pressure 120/60, pulse 78, temperature 97.9 F (36.6 C), temperature source Oral, resp. rate 18, height 5\' 2"  (1.575 m), weight 148 lb (67.132 kg)., Body mass index is 27.06 kg/(m^2). General: Well developed, well nourished, in no acute distress.  Neck: Supple. No thyromegaly. Full ROM. No lymphadenopathy.No carotid bruits. Lungs: Clear bilaterally to auscultation without wheezes, rales, or rhonchi. Breathing is unlabored. Heart: RRR with normal S1 S2. No murmurs, rubs, or gallops. Abdomen: Soft,  non-tender, non-distended with normoactive bowel sounds. No hepatosplenomegaly. No rebound/guarding. No obvious abdominal masses. Msk:  Strength and tone normal for age. Extremities/Skin: Warm and dry. No clubbing or cyanosis. No edema. No rashes or suspicious lesions.  Neuro: Alert and oriented X 3. Moves all extremities spontaneously. Gait is normal. CNII-XII grossly in tact. Psych:  Responds to questions appropriately with a normal affect. Diabetic Foot Exam: Normal inspection. No skin breakdown. No callous. Normal sensation to light touch and monofilament. Nails are normal. 2+ bilateral dorsalis pedis pulses. 2+ bilateral posterior tibialis pulses.    Assessment and Plan:   1. DM2 (diabetes mellitus, type 2) Check A1C then adjust meds accordngly. I reminded pt again today that she needs eye exam.She is to schedule.  - Hemoglobin A1c  2. HTN (hypertension) At goal. On ACE Inh.  - COMPLETE METABOLIC PANEL WITH GFR  3. HLD (hyperlipidemia) In past D/Ced Niaspan sec to cost. Was at goal last check 10/13. Recheck now.  - COMPLETE METABOLIC PANEL WITH GFR - Lipid panel  4. Unspecified vitamin D deficiency On 2,000 IU QD - Vitamin D 25 hydroxy  5. GERD (gastroesophageal reflux disease) Controlled  With Prevacid.  6. Hiatal hernia  7. Pap                Done by Gyn 8. Mammogram-By Gyn 9. DEXA-To discuss with Gyn  10. EGD/Colon; Had Fall 2013.  11. Imunizations: Tetanus: Per Pt UTD; Had accident 2006       Pneumovax; Defers       Zostavax;Defers   -  Signed, [address] [place], [place], [facility]  11/09/2012 5:34 PM

## 2012-11-10 LAB — VITAMIN D 25 HYDROXY (VIT D DEFICIENCY, FRACTURES): Vit D, 25-Hydroxy: 53 ng/mL (ref 30–89)

## 2012-11-12 ENCOUNTER — Encounter: Payer: Self-pay | Admitting: Physician Assistant

## 2012-11-14 MED ORDER — PIOGLITAZONE HCL 30 MG PO TABS: 30.0000 mg | ORAL_TABLET | Freq: Every day | ORAL | Status: DC

## 2012-11-14 NOTE — Addendum Note (Signed)
Addended by: Elvina Mattes T on: 11/14/2012 11:44 AM   Modules accepted: Orders

## 2012-11-14 NOTE — Progress Notes (Signed)
Pt aware of results and prescription is called in and pt stated that she will cut her simvastatin in half and when she is ready she will call for refill

## 2012-11-29 ENCOUNTER — Encounter: Payer: Self-pay | Admitting: Physician Assistant

## 2013-01-02 ENCOUNTER — Telehealth: Payer: Self-pay | Admitting: Physician Assistant

## 2013-01-02 MED ORDER — RAMIPRIL 10 MG PO CAPS: 10.0000 mg | ORAL_CAPSULE | Freq: Every evening | ORAL | Status: DC

## 2013-01-02 NOTE — Telephone Encounter (Signed)
Med refilled.

## 2013-02-09 ENCOUNTER — Ambulatory Visit: Payer: BC Managed Care – PPO | Admitting: Physician Assistant

## 2013-02-10 ENCOUNTER — Ambulatory Visit: Payer: BC Managed Care – PPO | Admitting: Physician Assistant

## 2013-02-11 ENCOUNTER — Other Ambulatory Visit: Payer: Self-pay | Admitting: Physician Assistant

## 2013-03-09 ENCOUNTER — Encounter: Payer: Self-pay | Admitting: Physician Assistant

## 2013-03-09 ENCOUNTER — Ambulatory Visit (INDEPENDENT_AMBULATORY_CARE_PROVIDER_SITE_OTHER): Payer: BC Managed Care – PPO | Admitting: Physician Assistant

## 2013-03-09 VITALS — BP 114/68 | HR 72 | Temp 98.0°F | Resp 18 | Wt 146.0 lb

## 2013-03-09 DIAGNOSIS — K449 Diaphragmatic hernia without obstruction or gangrene: Secondary | ICD-10-CM

## 2013-03-09 DIAGNOSIS — E785 Hyperlipidemia, unspecified: Secondary | ICD-10-CM

## 2013-03-09 DIAGNOSIS — E119 Type 2 diabetes mellitus without complications: Secondary | ICD-10-CM

## 2013-03-09 DIAGNOSIS — I10 Essential (primary) hypertension: Secondary | ICD-10-CM

## 2013-03-09 DIAGNOSIS — E559 Vitamin D deficiency, unspecified: Secondary | ICD-10-CM

## 2013-03-09 DIAGNOSIS — K219 Gastro-esophageal reflux disease without esophagitis: Secondary | ICD-10-CM

## 2013-03-09 LAB — LIPID PANEL
Cholesterol: 129 mg/dL (ref 0–200)
Total CHOL/HDL Ratio: 3.2 Ratio
Triglycerides: 194 mg/dL — ABNORMAL HIGH (ref <150)
VLDL: 39 mg/dL (ref 0–40)

## 2013-03-09 LAB — COMPLETE METABOLIC PANEL WITH GFR
AST: 18 U/L (ref 0–37)
Alkaline Phosphatase: 75 U/L (ref 39–117)
BUN: 13 mg/dL (ref 6–23)
Creat: 0.75 mg/dL (ref 0.50–1.10)
Potassium: 4.7 mEq/L (ref 3.5–5.3)

## 2013-03-09 NOTE — Progress Notes (Signed)
Patient ID: [name] MRN: [id number], DOB: 07/03/1951, 62 y.o. Date of Encounter: @DATE @  Chief Complaint:  Chief Complaint  Patient presents with  . routine 3 mth checkup    is fasting    HPI: 62 y.o. year old white female  presents for routine followup office visit.  Diabetes: At her last office visit 11/09/12 her A1c came back at 7.7. I recommended that she add Actos 30 mg. However she states that when staff called and told her this that she wanted to try to make some changes on her unknown prior to adding more medications. She says that she had been eating some sweets prior to that and had not been ProStretch with her diet. She says that she got a phone call she has stopped the sweets and has been more compliant with her diet. She says that if A1c is not down today that she is agreeable to start the activities if needed. She continues to just check her blood sugar about once per week fasting in the morning. She is still getting those readings to be between 110-120. She still exercises regularly. She walks 2.5 miles every day "unless it is less than 20 degrees!"  Hypertension: She is taking the medications as directed with no adverse effects. No lower extremity edema. No lightheadedness.  Hyperlipidemia: At her lab on 11/10/2012 her LDL was down to 47 at that time I would told her she could decrease her Zocor from 80 down to40 mg. She did make this change and is taking the 40 mg now. She has no myalgias and no other adverse effects.  She has no complaints today. Even with her walking 2-1/2 miles she has no angina symptoms.   Past Medical History  Diagnosis Date  . Diabetes mellitus   . Hyperlipidemia   . Hypertension   . GERD (gastroesophageal reflux disease)   . Hiatal hernia   . Hiatal hernia   . Rosacea   . Nephrolithiasis   . Pulmonary nodule     no change-1995  . Elevated liver function tests     eval by dr Jettie Pagan to fatty liver 2008  . Ulcer     gastric  ulcer, antritis-egd 09/06/08  . Vitamin D deficiency   . NASH (nonalcoholic steatohepatitis)   . DDD (degenerative disc disease)   . Complication of anesthesia   . PONV (postoperative nausea and vomiting)     heel surgery     Home Meds: See attached medication section for current medication list. Any medications entered into computer today will not appear on this note's list. The medications listed below were entered prior to today. Current Outpatient Prescriptions on File Prior to Visit  Medication Sig Dispense Refill  . Cholecalciferol (VITAMIN D) 2000 UNITS tablet Take 2,000 Units by mouth 2 (two) times daily.       . lansoprazole (PREVACID) 30 MG capsule Take 30 mg by mouth 2 (two) times daily as needed.      . metFORMIN (GLUCOPHAGE) 1000 MG tablet Take 1,000 mg by mouth 2 (two) times daily with a meal.        . metoprolol succinate (TOPROL-XL) 25 MG 24 hr tablet TAKE 1 TABLET (25 MG TOTAL) BY MOUTH EVERY EVENING.  30 tablet  3  . ramipril (ALTACE) 10 MG capsule Take 1 capsule (10 mg total) by mouth every evening.  90 capsule  1   No current facility-administered medications on file prior to visit.    Allergies:  Allergies  Allergen Reactions  . Codeine Nausea And Vomiting  . Penicillins Rash    History   Social History  . Marital Status: Married    Spouse Name: N/A    Number of Children: 2  . Years of Education: N/A   Occupational History  . homemaker    Social History Main Topics  . Smoking status: Former Smoker    Types: Cigarettes    Quit date: 07/06/1978  . Smokeless tobacco: Not on file     Comment: intermittent for a few months  . Alcohol Use: Yes     Comment: occasional mixed drink couple per mo  . Drug Use: No  . Sexual Activity: Not on file   Other Topics Concern  . Not on file   Social History Narrative   Lives w/ husband    Family History  Problem Relation Age of Onset  . Hyperlipidemia Mother   . COPD Father   . Heart disease Neg Hx   .  Colon cancer Maternal Aunt   . Colon cancer Maternal Grandfather      Review of Systems:  See HPI for pertinent ROS. All other ROS negative.    Physical Exam: Blood pressure 114/68, pulse 72, temperature 98 F (36.7 C), temperature source Oral, resp. rate 18, weight 146 lb (66.225 kg)., Body mass index is 26.7 kg/(m^2). General: Well-nourished well-developed white female.  Appears in no acute distress Neck: Supple. No thyromegaly. No lymphadenopathy. No carotid bruits Lungs: Clear bilaterally to auscultation without wheezes, rales, or rhonchi. Breathing is unlabored. Heart: RRR with S1 S2. No murmurs, rubs, or gallops. Abdomen: Soft, non-tender, non-distended with normoactive bowel sounds. No hepatomegaly. No rebound/guarding. No obvious abdominal masses. Musculoskeletal:  Strength and tone normal for 62. Extremities/Skin: Warm and dry. No clubbing or cyanosis. No edema. No rashes or suspicious lesions. Neuro: Alert and oriented X 3. Moves all extremities spontaneously. Gait is normal. CNII-XII grossly in tact. Psych:  Responds to questions appropriately with a normal affect. Diabetic foot exam: Normal inspection. No skin breakdown. No callus. Normal sensation to light touch and monofilament. 2+ bilateral dorsalis pedis pulses and 2+ bilateral posterior tibial pulses. This is also documented in the attached section.      ASSESSMENT AND PLAN:  62 y.o. year old female with  1. DM2 (diabetes mellitus, type 2) Her last A1c on 11/09/2012 came back elevated at 7.7. I have recommended she add Actos. However instead she has made significant diet changes. She has continued her exercise. We'll recheck A1c now. She does state that if A1c is still high today that she is agreeable to start the Actos but wanted to make these changes first. Her last eye exam was 2 years ago was normal. She is aware that she needs followup eye exam. - COMPLETE METABOLIC PANEL WITH GFR - Lipid panel - Microalbumin,  urine - Hemoglobin A1c  2. HTN (hypertension) At goal. On ACE inhibitor. - COMPLETE METABOLIC PANEL WITH GFR  3. HLD (hyperlipidemia) At last check on 11/10/12 LDL was down to 47. She did decrease his Zocor from 80 mg down to the milligrams. We'll recheck lab on new dose. - COMPLETE METABOLIC PANEL WITH GFR - Lipid panel  4. GERD (gastroesophageal reflux disease) Controlled with Prevacid.  5. Hiatal hernia 1 Prevacid.  6. Unspecified vitamin D deficiency On 2000 IUs daily. Vitamin D level was 53 at last check on 5/14. Continue current dose.  #7 done by GYN number #8 mammogram done by GYN  #9 Dexon  she has not had his to discuss this with GYN  #10 EGD/ Colonoscopy: Had these in the fall of 2013  #11 immunizations:      Tetanus: The patient reports this is up to date. She had an accident in 2006 and thinks was updated at that time.  Pneumovax: The first  Zostavax: Defers  Regular office visit 3 months.   Signed, [address] [place], [place], [facility] 03/09/2013 2:26 PM

## 2013-03-14 ENCOUNTER — Telehealth: Payer: Self-pay | Admitting: Family Medicine

## 2013-03-14 MED ORDER — SIMVASTATIN 20 MG PO TABS: 20.0000 mg | ORAL_TABLET | Freq: Every day | ORAL | Status: DC

## 2013-03-14 MED ORDER — PIOGLITAZONE HCL 30 MG PO TABS: 30.0000 mg | ORAL_TABLET | Freq: Every day | ORAL | Status: DC

## 2013-03-14 NOTE — Telephone Encounter (Signed)
Pt called with lab results.  Still has Actos from last visit that she did not start.  Will start now.  Rx for 20 mg simvastatin to pharmacy.  3 month follow up appt already made

## 2013-03-14 NOTE — Telephone Encounter (Signed)
Message copied by Donne Anon on Tue Mar 14, 2013  4:46 PM ------      Message from: Allayne Butcher      Created: Fri Mar 10, 2013  1:21 PM       At her LOV (prior to yesterday's OV) --on 11/19/12 --labs showed 2 issues:      1- 11/19/12 A1C  Was 7.7--I recommended she add Actos. However, she decided to try to be stricter with her diet and see if this would control it. At OV yesterday she told me she was agreeable to start Actos if A1C still high.       A1C now 7.3--not much better. Tell her to go ahead and start the Actos 30 mg. Cont diet and exercise and other meds as well.      2- 11/19/12-- LDL was 47. I said she could decrease Zocor from 80 to 40. At OV yesterday she told me she DID decrease this. Tell her to check her medicine bottles and make sure she had decreased the correct med. If so, and truly is taking Zocor 40, then would furhter decrease it to 20mg . --LDL 50 at this lab check.      Recheck things at next OV in 3 mos. ------

## 2013-04-20 ENCOUNTER — Other Ambulatory Visit: Payer: Self-pay | Admitting: Family Medicine

## 2013-04-20 NOTE — Telephone Encounter (Signed)
Meds refilled.

## 2013-05-11 ENCOUNTER — Other Ambulatory Visit: Payer: Self-pay

## 2013-06-12 ENCOUNTER — Other Ambulatory Visit: Payer: Self-pay | Admitting: Physician Assistant

## 2013-06-12 NOTE — Telephone Encounter (Signed)
Medication refilled per protocol. 

## 2013-06-14 ENCOUNTER — Ambulatory Visit (INDEPENDENT_AMBULATORY_CARE_PROVIDER_SITE_OTHER): Payer: BC Managed Care – PPO | Admitting: Physician Assistant

## 2013-06-14 ENCOUNTER — Encounter: Payer: Self-pay | Admitting: Physician Assistant

## 2013-06-14 VITALS — BP 114/76 | HR 64 | Temp 97.5°F | Resp 18 | Wt 150.0 lb

## 2013-06-14 DIAGNOSIS — L719 Rosacea, unspecified: Secondary | ICD-10-CM

## 2013-06-14 DIAGNOSIS — E559 Vitamin D deficiency, unspecified: Secondary | ICD-10-CM

## 2013-06-14 DIAGNOSIS — I10 Essential (primary) hypertension: Secondary | ICD-10-CM

## 2013-06-14 DIAGNOSIS — Z1211 Encounter for screening for malignant neoplasm of colon: Secondary | ICD-10-CM

## 2013-06-14 DIAGNOSIS — K219 Gastro-esophageal reflux disease without esophagitis: Secondary | ICD-10-CM

## 2013-06-14 DIAGNOSIS — E119 Type 2 diabetes mellitus without complications: Secondary | ICD-10-CM

## 2013-06-14 DIAGNOSIS — E785 Hyperlipidemia, unspecified: Secondary | ICD-10-CM

## 2013-06-14 MED ORDER — METFORMIN HCL 1000 MG PO TABS: 1000.0000 mg | ORAL_TABLET | Freq: Two times a day (BID) | ORAL | Status: DC

## 2013-06-14 NOTE — Progress Notes (Signed)
Patient ID: Denise Meadows MRN: [id number], DOB: 1950-10-22, 62 y.o. Date of Encounter: @DATE @  Chief Complaint:  Chief Complaint  Patient presents with  . 3 mth check up    is fasting    HPI: 62 y.o. year old white female  presents for routine followup office visit. She has no complaints today.  Diabetes: She reports that she did add the Actos 30 mg. She checks her blood sugar 3 days per week fasting in the morning. Readings range from 97-110. She was walking 2.5 miles twice a day. However she says that the friend that was walking with her in the evening has stopped. She is hoping that her husband will start walking with her in the evening. But for now she is currently just walking in the morning. Says that she got up and walked at 5 AM this morning. She is very careful with her diet and eats low carbohydrate diet.  Pretension: He is taking the medications as directed with no adverse effects. The lower extremity edema. No lightheadedness.  Hyperlipidemia: She says that she has decreased her simvastatin down to 20 mg. Says that with her prior dose she is to have been cutting them in half. Therefore when I said decreased Zocor from 80 mg down to 40 mg really she went from 40 down to 20. The left, she says that she currently is at 20 mg Simvastatin.  Even  with her walking 2.5 miles a day she is having no angina symptoms.   Past Medical History  Diagnosis Date  . Diabetes mellitus   . Hyperlipidemia   . Hypertension   . GERD (gastroesophageal reflux disease)   . Hiatal hernia   . Hiatal hernia   . Rosacea   . Nephrolithiasis   . Pulmonary nodule     no change-1995  . Elevated liver function tests     eval by dr Jettie Pagan to fatty liver 2008  . Ulcer     gastric ulcer, antritis-egd 09/06/08  . Vitamin D deficiency   . NASH (nonalcoholic steatohepatitis)   . DDD (degenerative disc disease)   . Complication of anesthesia   . PONV (postoperative nausea and vomiting)      heel surgery     Home Meds: See attached medication section for current medication list. Any medications entered into computer today will not appear on this note's list. The medications listed below were entered prior to today. Current Outpatient Prescriptions on File Prior to Visit  Medication Sig Dispense Refill  . Cholecalciferol (VITAMIN D) 2000 UNITS tablet Take 2,000 Units by mouth 2 (two) times daily.       . lansoprazole (PREVACID) 30 MG capsule Take 30 mg by mouth 2 (two) times daily as needed.      . metoprolol succinate (TOPROL-XL) 25 MG 24 hr tablet TAKE 1 TABLET (25 MG TOTAL) BY MOUTH EVERY EVENING.  30 tablet  3  . pioglitazone (ACTOS) 30 MG tablet Take 1 tablet (30 mg total) by mouth daily.      . ramipril (ALTACE) 10 MG capsule Take 1 capsule (10 mg total) by mouth every evening.  90 capsule  1  . simvastatin (ZOCOR) 20 MG tablet TAKE 1 TABLET (20 MG TOTAL) BY MOUTH AT BEDTIME.  90 tablet  1   No current facility-administered medications on file prior to visit.    Allergies:  Allergies  Allergen Reactions  . Codeine Nausea And Vomiting  . Penicillins Rash    History  Social History  . Marital Status: Married    Spouse Name: N/A    Number of Children: 2  . Years of Education: N/A   Occupational History  . homemaker    Social History Main Topics  . Smoking status: Former Smoker    Types: Cigarettes    Quit date: 07/06/1978  . Smokeless tobacco: Not on file     Comment: intermittent for a few months  . Alcohol Use: Yes     Comment: occasional mixed drink couple per mo  . Drug Use: No  . Sexual Activity: Not on file   Other Topics Concern  . Not on file   Social History Narrative   Lives w/ husband    Family History  Problem Relation Age of Onset  . Hyperlipidemia Mother   . COPD Father   . Heart disease Neg Hx   . Colon cancer Maternal Aunt   . Colon cancer Maternal Grandfather      Review of Systems:  See HPI for pertinent ROS. All  other ROS negative.    Physical Exam: Blood pressure 114/76, pulse 64, temperature 97.5 F (36.4 C), temperature source Oral, resp. rate 18, weight 150 lb (68.04 kg)., Body mass index is 27.43 kg/(m^2). General: WNWD WF. Appears in no acute distress. Neck: Supple. No thyromegaly. No lymphadenopathy. No carotid bruits. Lungs: Clear bilaterally to auscultation without wheezes, rales, or rhonchi. Breathing is unlabored. Heart: RRR with S1 S2. No murmurs, rubs, or gallops. Abdomen: Soft, non-tender, non-distended with normoactive bowel sounds. No hepatomegaly. No rebound/guarding. No obvious abdominal masses. Musculoskeletal:  Strength and tone normal for age. Extremities/Skin: Warm and dry. No clubbing or cyanosis. No edema. No rashes or suspicious lesions. Neuro: Alert and oriented X 3. Moves all extremities spontaneously. Gait is normal. CNII-XII grossly in tact. Psych:  Responds to questions appropriately with a normal affect. Diabetic foot exam: Normal inspection. No skin breakdown. No callus. Normal sensation to light touch with monofilament. 2+ bilateral dorsalis pedis pulses and 2+ bilateral posterior tibial pulses. This is also documented in the attached section today.      ASSESSMENT AND PLAN:  62 y.o. year old female with  1. DM2 (diabetes mellitus, type 2) - metFORMIN (GLUCOPHAGE) 1000 MG tablet; Take 1 tablet (1,000 mg total) by mouth 2 (two) times daily with a meal.  Dispense: 180 tablet; Refill: 3 - Hemoglobin A1C, fingerstick  Her last eye exam was 2 years ago and was normal. I discussed need for followup at her last visit. However today she states that she forgot. I wrote this on the top of her AVS as her "homework" today. He agrees to schedule this.  Her albumin was performed 03/09/13. Was normal. On ACE inhibitor. On Statin.  2. HTN (hypertension) Blood pressure is at goal. 114/76 today.  On ACE inhibitor. CMET was normal 03/09/13.  3. HLD (hyperlipidemia) She did  decrease her statin. She is now on simvastatin 20 mg. This is  confusing compared to the result note that I have on the lab results from 03/09/13. However at that time she says that she really was taking 1/2 of a pill.  At that time, rather than decreasing from 80 mg to 40 mg she has decreased from 40 mg down to 20 mg.  4. GERD (gastroesophageal reflux disease)  5. Rosacea  6. Unspecified vitamin D deficiency On 2000 IUs daily. Vitamin D level was nml at last check on May 2014. Continue current dose.  7. pelvic exam, Pap smear--  per GYN  8. mammogram: By GYN  9. DEXA: per Gyn  10. EGD/colonoscopy: Had these in the fall of 2013  11. Immunizations:  Tetanus: Patient reports this is up to date. Says she had an accident in 2006 and should have it updated at that time per report. Pneumovax: She defers Zostavax: She defers   Signed, Shon Hale [place], [place], [facility] 06/14/2013 1:55 PM

## 2013-07-03 ENCOUNTER — Other Ambulatory Visit: Payer: Self-pay | Admitting: Physician Assistant

## 2013-07-03 NOTE — Telephone Encounter (Signed)
Medication refilled per protocol. 

## 2013-07-21 ENCOUNTER — Other Ambulatory Visit: Payer: Self-pay | Admitting: Physician Assistant

## 2013-09-12 ENCOUNTER — Other Ambulatory Visit: Payer: Self-pay | Admitting: Physician Assistant

## 2013-09-12 DIAGNOSIS — E559 Vitamin D deficiency, unspecified: Secondary | ICD-10-CM

## 2013-09-13 ENCOUNTER — Ambulatory Visit (INDEPENDENT_AMBULATORY_CARE_PROVIDER_SITE_OTHER): Payer: BC Managed Care – PPO | Admitting: Physician Assistant

## 2013-09-13 ENCOUNTER — Encounter: Payer: Self-pay | Admitting: Physician Assistant

## 2013-09-13 VITALS — BP 122/76 | HR 68 | Temp 98.3°F | Resp 18 | Ht 61.75 in | Wt 155.0 lb

## 2013-09-13 DIAGNOSIS — I10 Essential (primary) hypertension: Secondary | ICD-10-CM

## 2013-09-13 DIAGNOSIS — E119 Type 2 diabetes mellitus without complications: Secondary | ICD-10-CM

## 2013-09-13 DIAGNOSIS — Z1211 Encounter for screening for malignant neoplasm of colon: Secondary | ICD-10-CM

## 2013-09-13 DIAGNOSIS — K219 Gastro-esophageal reflux disease without esophagitis: Secondary | ICD-10-CM

## 2013-09-13 DIAGNOSIS — E559 Vitamin D deficiency, unspecified: Secondary | ICD-10-CM

## 2013-09-13 DIAGNOSIS — E785 Hyperlipidemia, unspecified: Secondary | ICD-10-CM

## 2013-09-13 DIAGNOSIS — L719 Rosacea, unspecified: Secondary | ICD-10-CM

## 2013-09-13 LAB — COMPLETE METABOLIC PANEL WITH GFR
ALT: 11 U/L (ref 0–35)
AST: 14 U/L (ref 0–37)
Albumin: 4.1 g/dL (ref 3.5–5.2)
Alkaline Phosphatase: 61 U/L (ref 39–117)
BUN: 17 mg/dL (ref 6–23)
CO2: 27 meq/L (ref 19–32)
Calcium: 9.8 mg/dL (ref 8.4–10.5)
Chloride: 102 mEq/L (ref 96–112)
Creat: 0.83 mg/dL (ref 0.50–1.10)
GFR, EST NON AFRICAN AMERICAN: 76 mL/min
GFR, Est African American: 87 mL/min
GLUCOSE: 100 mg/dL — AB (ref 70–99)
POTASSIUM: 4.5 meq/L (ref 3.5–5.3)
Sodium: 140 mEq/L (ref 135–145)
Total Bilirubin: 0.4 mg/dL (ref 0.2–1.2)
Total Protein: 6.8 g/dL (ref 6.0–8.3)

## 2013-09-13 LAB — HEMOGLOBIN A1C
Hgb A1c MFr Bld: 6.2 % — ABNORMAL HIGH (ref <5.7)
MEAN PLASMA GLUCOSE: 131 mg/dL — AB (ref <117)

## 2013-09-13 LAB — LIPID PANEL
CHOLESTEROL: 168 mg/dL (ref 0–200)
HDL: 44 mg/dL (ref >39)
LDL Cholesterol: 85 mg/dL (ref 0–99)
Total CHOL/HDL Ratio: 3.8 Ratio
Triglycerides: 197 mg/dL — ABNORMAL HIGH (ref <150)
VLDL: 39 mg/dL (ref 0–40)

## 2013-09-13 NOTE — Progress Notes (Signed)
Patient ID: MAYSEL MCCOLM MRN: [id number], DOB: 06/30/1951, 63 y.o. Date of Encounter: @DATE @  Chief Complaint:  Chief Complaint  Patient presents with  . 3 month check up    is fasting    HPI: 63 y.o. year old white female  presents for routine followup office visit.  Diabetes: I discussed that her last A1c was down and that I was concerned about possible hypoglycemia. However she states that she is getting no low blood sugar readings. She checks her blood sugar fasting once a week. Always gets between 95-105. Never gets less than 80.  is taking all medications as directed including her Actos 30 mg which was most recently added. In the past she was walking 2.5 miles twice a day. However at her last visit in December she reported that the friend who had been walking with her in the evening had stopped.   however they were still walking in the morning. Today she says that because of the bad weather in January and February she did not walk on the days when it was really cold weather with ice or snow. She is very careful with her diet and eats low carbohydrate diet.    Hypertension: Taking medications as directed with no adverse effects. No lower extremity edema. No lightheadedness.    hyperlipidemia: Her last lipid panel her LDL is very low so I had her decrease her simvastatin and she is down to 20 mg as directed. No myalgias or other adverse effects.  Even with exertion she is having no  angina symptoms. She's been feeling good and has no complaints today.   Past Medical History  Diagnosis Date  . Diabetes mellitus   . Hyperlipidemia   . Hypertension   . GERD (gastroesophageal reflux disease)   . Hiatal hernia   . Hiatal hernia   . Rosacea   . Nephrolithiasis   . Pulmonary nodule     no change-1995  . Elevated liver function tests     eval by dr Leonides Grills to fatty liver 2008  . Ulcer     gastric ulcer, antritis-egd 09/06/08  . Vitamin D deficiency   . NASH  (nonalcoholic steatohepatitis)   . DDD (degenerative disc disease)   . Complication of anesthesia   . PONV (postoperative nausea and vomiting)     heel surgery     Home Meds: See attached medication section for current medication list. Any medications entered into computer today will not appear on this note's list. The medications listed below were entered prior to today. Current Outpatient Prescriptions on File Prior to Visit  Medication Sig Dispense Refill  . Cholecalciferol (VITAMIN D) 2000 UNITS tablet Take 2,000 Units by mouth 2 (two) times daily.       . lansoprazole (PREVACID) 30 MG capsule TAKE 1 CAPSULE BY MOUTH TWICE DAILY  60 capsule  2  . metFORMIN (GLUCOPHAGE) 1000 MG tablet Take 1 tablet (1,000 mg total) by mouth 2 (two) times daily with a meal.  180 tablet  3  . metoprolol succinate (TOPROL-XL) 25 MG 24 hr tablet TAKE 1 TABLET (25 MG TOTAL) BY MOUTH EVERY EVENING.  30 tablet  3  . pioglitazone (ACTOS) 30 MG tablet Take 1 tablet (30 mg total) by mouth daily.      . ramipril (ALTACE) 10 MG capsule Take 1 capsule (10 mg total) by mouth every evening.  90 capsule  1  . simvastatin (ZOCOR) 20 MG tablet TAKE 1 TABLET (20 MG TOTAL)  BY MOUTH AT BEDTIME.  90 tablet  1   No current facility-administered medications on file prior to visit.    Allergies:  Allergies  Allergen Reactions  . Codeine Nausea And Vomiting  . Penicillins Rash    History   Social History  . Marital Status: Married    Spouse Name: N/A    Number of Children: 2  . Years of Education: N/A   Occupational History  . homemaker    Social History Main Topics  . Smoking status: Former Smoker    Types: Cigarettes    Quit date: 07/06/1978  . Smokeless tobacco: Never Used     Comment: intermittent for a few months  . Alcohol Use: Yes     Comment: occasional mixed drink couple per mo  . Drug Use: No  . Sexual Activity: Not on file   Other Topics Concern  . Not on file   Social History Narrative    Lives w/ husband    Family History  Problem Relation Age of Onset  . Hyperlipidemia Mother   . COPD Father   . Heart disease Neg Hx   . Colon cancer Maternal Aunt   . Colon cancer Maternal Grandfather      Review of Systems:  See HPI for pertinent ROS. All other ROS negative.    Physical Exam: Blood pressure 122/76, pulse 68, temperature 98.3 F (36.8 C), temperature source Oral, resp. rate 18, height 5' 1.75" (1.568 m), weight 155 lb (70.308 kg)., Body mass index is 28.6 kg/(m^2). General: WNWD WF. Appears in no acute distress. Neck: Supple. No thyromegaly. No lymphadenopathy. No carotid bruits  Lungs: Clear bilaterally to auscultation without wheezes, rales, or rhonchi. Breathing is unlabored. Heart: RRR with S1 S2. No murmurs, rubs, or gallops. Abdomen: Soft, non-tender, non-distended with normoactive bowel sounds. No hepatomegaly. No rebound/guarding. No obvious abdominal masses. Musculoskeletal:  Strength and tone normal for age. Extremities/Skin: Warm and dry. No clubbing or cyanosis. No edema. No rashes or suspicious lesions. Neuro: Alert and oriented X 3. Moves all extremities spontaneously. Gait is normal. CNII-XII grossly in tact. Psych:  Responds to questions appropriately with a normal affect. Diabetic foot exam: Normal inspection. No skin breakdown. No callus. Normal sensation to light touch with monofilament. 2+ bilateral dorsalis pedis pulses and 2+ bilateral posterior tibial pulses.      ASSESSMENT AND PLAN:  63 y.o. year old female with  1. DM2 (diabetes mellitus, type 2)  Today she reports that she recently had an eye exam. Says that the eye exam was normal and there were no signs of problems related to her diabetes.  Exam is normal.  Kidney function and microalbumin are completely normal.  She is on ACE inhibitor.  She is on statin.  Microalbumin was performed 03/09/13 and was normal.   - COMPLETE METABOLIC PANEL WITH GFR - Hemoglobin A1c  2. HTN  (hypertension) Blood pressure is at goal. On ACE inhibitor.  - COMPLETE METABOLIC PANEL WITH GFR  3. HLD (hyperlipidemia) She has decreased her simvastatin down to 20 mg. Recheck now.  - COMPLETE METABOLIC PANEL WITH GFR - Lipid panel  4. Unspecified vitamin D deficiency On000 units daily. Vitamin D level was normal at last check a 2014.  Cont current dose.  5. GERD (gastroesophageal reflux disease)  6. Encounter for screening colonoscopy  7. Rosacea  8. Pelvic exam, Pap smear --Per GYN  9. mammogram : GYN office  10: DEXA: These are ordered and managed by GYN  11. EGD/colonoscopy: Had these in the fall of 2013.  12. Immunizations: Tetanus: Patient reports this is up to date. Says she had an accident in 2006 and she did have it updated at that time per her report. Pneumovax: She defers   Zostavax: She defers  Regular office visit in 3 months or sooner if needed.   Signed, [address] [place], [place], [facility] 09/13/2013 9:16 AM

## 2013-09-14 ENCOUNTER — Telehealth: Payer: Self-pay | Admitting: Family Medicine

## 2013-09-14 DIAGNOSIS — I10 Essential (primary) hypertension: Secondary | ICD-10-CM

## 2013-09-14 MED ORDER — RAMIPRIL 10 MG PO CAPS: 10.0000 mg | ORAL_CAPSULE | Freq: Every evening | ORAL | Status: DC

## 2013-09-14 NOTE — Telephone Encounter (Signed)
Message copied by Olena Mater on Thu Sep 14, 2013  8:37 AM ------      Message from: Dena Billet      Created: Thu Sep 14, 2013  7:57 AM       All labs are great!!!      Continue all medications the same. ------

## 2013-09-14 NOTE — Telephone Encounter (Signed)
Pt was called by provider and made aware of lab results.  Ramipril refilled as directed

## 2013-10-04 LAB — HM MAMMOGRAPHY

## 2013-10-30 ENCOUNTER — Other Ambulatory Visit: Payer: Self-pay | Admitting: Obstetrics and Gynecology

## 2013-11-21 ENCOUNTER — Other Ambulatory Visit: Payer: Self-pay | Admitting: Physician Assistant

## 2013-11-21 NOTE — Telephone Encounter (Signed)
Refill appropriate and filled per protocol. 

## 2013-12-20 ENCOUNTER — Encounter: Payer: Self-pay | Admitting: Physician Assistant

## 2013-12-20 ENCOUNTER — Ambulatory Visit (INDEPENDENT_AMBULATORY_CARE_PROVIDER_SITE_OTHER): Payer: BC Managed Care – PPO | Admitting: Physician Assistant

## 2013-12-20 ENCOUNTER — Encounter: Payer: Self-pay | Admitting: Family Medicine

## 2013-12-20 VITALS — BP 116/72 | HR 64 | Temp 97.7°F | Resp 18 | Wt 158.0 lb

## 2013-12-20 DIAGNOSIS — I10 Essential (primary) hypertension: Secondary | ICD-10-CM

## 2013-12-20 DIAGNOSIS — E119 Type 2 diabetes mellitus without complications: Secondary | ICD-10-CM

## 2013-12-20 DIAGNOSIS — G4700 Insomnia, unspecified: Secondary | ICD-10-CM

## 2013-12-20 DIAGNOSIS — E785 Hyperlipidemia, unspecified: Secondary | ICD-10-CM

## 2013-12-20 DIAGNOSIS — K219 Gastro-esophageal reflux disease without esophagitis: Secondary | ICD-10-CM

## 2013-12-20 DIAGNOSIS — Z1211 Encounter for screening for malignant neoplasm of colon: Secondary | ICD-10-CM

## 2013-12-20 DIAGNOSIS — E559 Vitamin D deficiency, unspecified: Secondary | ICD-10-CM

## 2013-12-20 DIAGNOSIS — L719 Rosacea, unspecified: Secondary | ICD-10-CM

## 2013-12-20 LAB — HEMOGLOBIN A1C, FINGERSTICK: Hgb A1C (fingerstick): 6 % — ABNORMAL HIGH (ref <5.7)

## 2013-12-20 NOTE — Progress Notes (Signed)
Patient ID: Denise Meadows MRN: [id number], DOB: 10-01-1950, 63 y.o. Date of Encounter: @DATE @  Chief Complaint:  Chief Complaint  Patient presents with  . 3 month check up    is fasting    HPI: 63 y.o. year old white female  presents for routine followup office visit.  Diabetes: I discussed that her last A1c was down and that I was concerned about possible hypoglycemia. However she states that she is getting no low blood sugar readings. She checks her blood sugar fasting once a week. Always gets between 95-105. Never gets less than 80.  is taking all medications as directed including her Actos 30 mg which was most recently added. In the past she was walking 2.5 miles twice a day. However at her visit in December 2014 she reported that the friend who had been walking with her in the evening had stopped.However they were still walking in the morning. Also, in really cold weather in January and February she did not walk on the days when it was really cold weather with ice or snow. She is very careful with her diet and eats low carbohydrate diet.    Hypertension: Taking medications as directed with no adverse effects. No lower extremity edema. No lightheadedness.    hyperlipidemia: Her last lipid panel her LDL is very low so I had her decrease her simvastatin and she is down to 20 mg as directed. No myalgias or other adverse effects.  Even with exertion she is having no  angina symptoms. She's been feeling good and has no complaints today.   Past Medical History  Diagnosis Date  . Diabetes mellitus   . Hyperlipidemia   . Hypertension   . GERD (gastroesophageal reflux disease)   . Hiatal hernia   . Hiatal hernia   . Rosacea   . Nephrolithiasis   . Pulmonary nodule     no change-1995  . Elevated liver function tests     eval by dr Leonides Grills to fatty liver 2008  . Ulcer     gastric ulcer, antritis-egd 09/06/08  . Vitamin D deficiency   . NASH (nonalcoholic  steatohepatitis)   . DDD (degenerative disc disease)   . Complication of anesthesia   . PONV (postoperative nausea and vomiting)     heel surgery     Home Meds: Outpatient Prescriptions Prior to Visit  Medication Sig Dispense Refill  . Cholecalciferol (VITAMIN D) 2000 UNITS tablet Take 2,000 Units by mouth daily.       . metFORMIN (GLUCOPHAGE) 1000 MG tablet Take 1 tablet (1,000 mg total) by mouth 2 (two) times daily with a meal.  180 tablet  3  . metoprolol succinate (TOPROL-XL) 25 MG 24 hr tablet TAKE 1 TABLET (25 MG TOTAL) BY MOUTH EVERY EVENING.  30 tablet  3  . pioglitazone (ACTOS) 30 MG tablet Take 1 tablet (30 mg total) by mouth daily.      . ramipril (ALTACE) 10 MG capsule Take 1 capsule (10 mg total) by mouth every evening.  90 capsule  1  . simvastatin (ZOCOR) 20 MG tablet TAKE 1 TABLET (20 MG TOTAL) BY MOUTH AT BEDTIME.  90 tablet  1  . lansoprazole (PREVACID) 30 MG capsule TAKE 1 CAPSULE BY MOUTH TWICE DAILY  60 capsule  2   No facility-administered medications prior to visit.     Allergies:  Allergies  Allergen Reactions  . Codeine Nausea And Vomiting  . Penicillins Rash    History  Social History  . Marital Status: Married    Spouse Name: N/A    Number of Children: 2  . Years of Education: N/A   Occupational History  . homemaker    Social History Main Topics  . Smoking status: Former Smoker    Types: Cigarettes    Quit date: 07/06/1978  . Smokeless tobacco: Never Used     Comment: intermittent for a few months  . Alcohol Use: Yes     Comment: occasional mixed drink couple per mo  . Drug Use: No  . Sexual Activity: Not on file   Other Topics Concern  . Not on file   Social History Narrative   Lives w/ husband    Family History  Problem Relation Age of Onset  . Hyperlipidemia Mother   . COPD Father   . Heart disease Neg Hx   . Colon cancer Maternal Aunt   . Colon cancer Maternal Grandfather      Review of Systems:  See HPI for pertinent  ROS. All other ROS negative.    Physical Exam: There were no vitals taken for this visit., There is no weight on file to calculate BMI. General: WNWD WF. Appears in no acute distress. Neck: Supple. No thyromegaly. No lymphadenopathy. No carotid bruits  Lungs: Clear bilaterally to auscultation without wheezes, rales, or rhonchi. Breathing is unlabored. Heart: RRR with S1 S2. No murmurs, rubs, or gallops. Abdomen: Soft, non-tender, non-distended with normoactive bowel sounds. No hepatomegaly. No rebound/guarding. No obvious abdominal masses. Musculoskeletal:  Strength and tone normal for age. Extremities/Skin: Warm and dry. No clubbing or cyanosis. No edema. No rashes or suspicious lesions. Neuro: Alert and oriented X 3. Moves all extremities spontaneously. Gait is normal. CNII-XII grossly in tact. Psych:  Responds to questions appropriately with a normal affect. Diabetic foot exam: Normal inspection. No skin breakdown. No callus. Normal sensation to light touch with monofilament. 2+ bilateral dorsalis pedis pulses and 2+ bilateral posterior tibial pulses.      ASSESSMENT AND PLAN:  63 y.o. year old female with  1. DM2 (diabetes mellitus, type 2)  Today she reports that she recently had an eye exam. Says that the eye exam was normal and there were no signs of problems related to her diabetes.  Foot Exam is normal.  Kidney function and microalbumin are completely normal.  She is on ACE inhibitor.  She is on statin.  NO ASA SECONDARY TO H/O PUD Microalbumin was performed 03/09/13 and was normal.   - Hemoglobin A1c  2. HTN (hypertension) Blood pressure is at goal. On ACE inhibitor.  CMET nml 09/2013  3. HLD (hyperlipidemia) 06/2013 decreased her simvastatin down to 20 mg. Rechecked 09/2013--CMET nml. FLP excellent. Tri-197 HDL--44 LDL--85   4. Unspecified vitamin D deficiency On 2000 units daily. Vitamin D level was normal at last check a 2014.  Cont current dose.  5. GERD  (gastroesophageal reflux disease)  6. Encounter for screening colonoscopy--Had Fall 2013--normal--repeat 10 years  7. Rosacea  8. Pelvic exam, Pap smear --Per GYN  9. mammogram : GYN office  10: DEXA: These are ordered and managed by GYN   11. EGD/colonoscopy: Had these in the fall of 2013.  12. Immunizations: Tetanus: Patient reports this is up to date. Says she had an accident in 2006 and she did have it updated at that time per her report. Pneumovax: She defers   Zostavax: She defers  Regular office visit in 3 months or sooner if needed.   Signed,  [address] [place], [place], [facility] 12/20/2013 8:05 AM

## 2014-01-11 ENCOUNTER — Encounter: Payer: Self-pay | Admitting: Physician Assistant

## 2014-01-11 ENCOUNTER — Ambulatory Visit (INDEPENDENT_AMBULATORY_CARE_PROVIDER_SITE_OTHER): Payer: BC Managed Care – PPO | Admitting: Physician Assistant

## 2014-01-11 VITALS — BP 128/76 | HR 72 | Temp 98.5°F | Resp 16 | Ht 61.0 in | Wt 157.0 lb

## 2014-01-11 DIAGNOSIS — B9689 Other specified bacterial agents as the cause of diseases classified elsewhere: Principal | ICD-10-CM

## 2014-01-11 DIAGNOSIS — J988 Other specified respiratory disorders: Secondary | ICD-10-CM

## 2014-01-11 DIAGNOSIS — A499 Bacterial infection, unspecified: Secondary | ICD-10-CM

## 2014-01-11 MED ORDER — AZITHROMYCIN 250 MG PO TABS: ORAL_TABLET | ORAL | Status: DC

## 2014-01-11 NOTE — Progress Notes (Signed)
Patient ID: Denise Meadows MRN: [id number], DOB: 1951/01/07, 63 y.o. Date of Encounter: 01/11/2014, 11:26 AM    Chief Complaint:  Chief Complaint  Patient presents with  . Illness    x1 month- allergies- x1 week- fatigue, fever/chills, hoarse, non productive cough     HPI: 63 y.o. year old female is that about a month ago she was having some symptoms that she thought were allergies so she used over-the-counter allergy medicines. His symptoms seem to be getting better. Then this past Thursday she developed some chills. Then for a couple days seemed to be feeling some better. However several days ago she started to feel really bad and developed chills again and significant cough. Says that she can feel phlegm deep in her chest but is unable to get it out. Has a congested cough and chest congestion. Throat is starting to feel a little hoarse. Has noted mucus from her nose and no ear pain and no documented fevers. Using Mucinex but still unable to get the phlegm out.     Home Meds:   Outpatient Prescriptions Prior to Visit  Medication Sig Dispense Refill  . Cholecalciferol (VITAMIN D) 2000 UNITS tablet Take 2,000 Units by mouth daily.       . lansoprazole (PREVACID) 30 MG capsule bid prn      . metFORMIN (GLUCOPHAGE) 1000 MG tablet Take 1 tablet (1,000 mg total) by mouth 2 (two) times daily with a meal.  180 tablet  3  . metoprolol succinate (TOPROL-XL) 25 MG 24 hr tablet TAKE 1 TABLET (25 MG TOTAL) BY MOUTH EVERY EVENING.  30 tablet  3  . pioglitazone (ACTOS) 30 MG tablet Take 1 tablet (30 mg total) by mouth daily.      . ramipril (ALTACE) 10 MG capsule Take 1 capsule (10 mg total) by mouth every evening.  90 capsule  1  . simvastatin (ZOCOR) 20 MG tablet TAKE 1 TABLET (20 MG TOTAL) BY MOUTH AT BEDTIME.  90 tablet  1   No facility-administered medications prior to visit.    Allergies:  Allergies  Allergen Reactions  . Codeine Nausea And Vomiting  . Penicillins Rash      Review  of Systems: See HPI for pertinent ROS. All other ROS negative.    Physical Exam: Blood pressure 128/76, pulse 72, temperature 98.5 F (36.9 C), temperature source Oral, resp. rate 16, height 5\' 1"  (1.549 m), weight 157 lb (71.215 kg)., Body mass index is 29.68 kg/(m^2). General: WNWD WF.  Appears in no acute distress. HEENT: Normocephalic, atraumatic, eyes without discharge, sclera non-icteric, nares are without discharge. Bilateral auditory canals clear, TM's are without perforation, pearly grey and translucent with reflective cone of light bilaterally. Oral cavity moist, posterior pharynx without exudate, erythema, peritonsillar abscess, or post nasal drip.  Neck: Supple. No thyromegaly. No lymphadenopathy. Lungs: Clear bilaterally to auscultation without wheezes, rales, or rhonchi. Breathing is unlabored. Heart: Regular rhythm. No murmurs, rubs, or gallops. Msk:  Strength and tone normal for age. Extremities/Skin: Warm and dry. Neuro: Alert and oriented X 3. Moves all extremities spontaneously. Gait is normal. CNII-XII grossly in tact. Psych:  Responds to questions appropriately with a normal affect.     ASSESSMENT AND PLAN:  63 y.o. year old female with  1. Bacterial respiratory infection - azithromycin (ZITHROMAX) 250 MG tablet; Day 1: Take 2 daily.  Days 2-5: Take 1 daily.  Dispense: 6 tablet; Refill: 0 Use Mucinex DM as expectorant. Follow up if symptoms do not  resolve within one week after completion of antibiotic.  Signed, [address] [place], [place], [facility] 01/11/2014 11:26 AM

## 2014-01-29 ENCOUNTER — Other Ambulatory Visit: Payer: Self-pay | Admitting: Physician Assistant

## 2014-01-29 NOTE — Telephone Encounter (Signed)
Refill appropriate and filled per protocol. 

## 2014-02-02 ENCOUNTER — Other Ambulatory Visit: Payer: Self-pay | Admitting: Physician Assistant

## 2014-02-02 NOTE — Telephone Encounter (Signed)
Medication refilled per protocol. 

## 2014-03-22 ENCOUNTER — Encounter: Payer: Self-pay | Admitting: Physician Assistant

## 2014-03-22 ENCOUNTER — Ambulatory Visit (INDEPENDENT_AMBULATORY_CARE_PROVIDER_SITE_OTHER): Payer: BC Managed Care – PPO | Admitting: Physician Assistant

## 2014-03-22 VITALS — BP 120/58 | HR 68 | Temp 98.0°F | Ht 62.0 in | Wt 162.0 lb

## 2014-03-22 DIAGNOSIS — R221 Localized swelling, mass and lump, neck: Secondary | ICD-10-CM

## 2014-03-22 DIAGNOSIS — G4700 Insomnia, unspecified: Secondary | ICD-10-CM

## 2014-03-22 DIAGNOSIS — I10 Essential (primary) hypertension: Secondary | ICD-10-CM

## 2014-03-22 DIAGNOSIS — R1310 Dysphagia, unspecified: Secondary | ICD-10-CM

## 2014-03-22 DIAGNOSIS — E119 Type 2 diabetes mellitus without complications: Secondary | ICD-10-CM

## 2014-03-22 DIAGNOSIS — E1165 Type 2 diabetes mellitus with hyperglycemia: Secondary | ICD-10-CM

## 2014-03-22 DIAGNOSIS — E559 Vitamin D deficiency, unspecified: Secondary | ICD-10-CM

## 2014-03-22 DIAGNOSIS — K219 Gastro-esophageal reflux disease without esophagitis: Secondary | ICD-10-CM

## 2014-03-22 DIAGNOSIS — L719 Rosacea, unspecified: Secondary | ICD-10-CM

## 2014-03-22 DIAGNOSIS — Z1211 Encounter for screening for malignant neoplasm of colon: Secondary | ICD-10-CM

## 2014-03-22 DIAGNOSIS — E785 Hyperlipidemia, unspecified: Secondary | ICD-10-CM

## 2014-03-22 DIAGNOSIS — K449 Diaphragmatic hernia without obstruction or gangrene: Secondary | ICD-10-CM

## 2014-03-22 DIAGNOSIS — R220 Localized swelling, mass and lump, head: Secondary | ICD-10-CM

## 2014-03-22 LAB — COMPLETE METABOLIC PANEL WITH GFR
ALT: 18 U/L (ref 0–35)
AST: 18 U/L (ref 0–37)
Albumin: 3.9 g/dL (ref 3.5–5.2)
Alkaline Phosphatase: 73 U/L (ref 39–117)
BUN: 16 mg/dL (ref 6–23)
CALCIUM: 9.4 mg/dL (ref 8.4–10.5)
CHLORIDE: 104 meq/L (ref 96–112)
CO2: 25 mEq/L (ref 19–32)
CREATININE: 0.86 mg/dL (ref 0.50–1.10)
GFR, Est African American: 83 mL/min
GFR, Est Non African American: 72 mL/min
Glucose, Bld: 90 mg/dL (ref 70–99)
Potassium: 4.4 mEq/L (ref 3.5–5.3)
Sodium: 138 mEq/L (ref 135–145)
Total Bilirubin: 0.5 mg/dL (ref 0.2–1.2)
Total Protein: 6.5 g/dL (ref 6.0–8.3)

## 2014-03-22 LAB — HEMOGLOBIN A1C
Hgb A1c MFr Bld: 6.1 % — ABNORMAL HIGH (ref <5.7)
Mean Plasma Glucose: 128 mg/dL — ABNORMAL HIGH (ref <117)

## 2014-03-22 LAB — LIPID PANEL
CHOL/HDL RATIO: 3.2 ratio
Cholesterol: 149 mg/dL (ref 0–200)
HDL: 46 mg/dL (ref >39)
LDL CALC: 69 mg/dL (ref 0–99)
TRIGLYCERIDES: 168 mg/dL — AB (ref <150)
VLDL: 34 mg/dL (ref 0–40)

## 2014-03-22 LAB — MICROALBUMIN, URINE: Microalb, Ur: 0.5 mg/dL (ref 0.00–1.89)

## 2014-03-22 NOTE — Progress Notes (Signed)
Patient ID: Denise Meadows MRN: [id number], DOB: [birth year], 63 y.o. Date of Encounter: @DATE @  Chief Complaint:  Chief Complaint  Patient presents with  . Diabetes    per patient-  says she's doing well, no problems with any medications.    HPI: 63 y.o. year old white female  presents for routine followup office visit.  Diabetes: I discussed that her last A1c was down and that I was concerned about possible hypoglycemia. However she states that she is getting no low blood sugar readings. She checks her blood sugar fasting once a week. Always gets between 95-105. Never gets less than 80.  is taking all medications as directed including her Actos 30 mg which was most recently added. In the past she was walking 2.5 miles twice a day. However at her visit in December 2014 she reported that the friend who had been walking with her in the evening had stopped.However they were still walking in the morning. Also, in really cold weather in January and February she did not walk on the days when it was really cold weather with ice or snow. She is very careful with her diet and eats low carbohydrate diet.    Hypertension: Taking medications as directed with no adverse effects. No lower extremity edema. No lightheadedness.    hyperlipidemia: Her last lipid panel her LDL is very low so I had her decrease her simvastatin and she is down to 20 mg as directed. No myalgias or other adverse effects.  Even with exertion she is having no  angina symptoms. She's been feeling good and has no complaints today.   Past Medical History  Diagnosis Date  . Diabetes mellitus   . Hyperlipidemia   . Hypertension   . GERD (gastroesophageal reflux disease)   . Hiatal hernia   . Hiatal hernia   . Rosacea   . Nephrolithiasis   . Pulmonary nodule     no change-1995  . Elevated liver function tests     eval by dr Leonides Grills to fatty liver 2008  . Ulcer     gastric ulcer, antritis-egd 09/06/08  . Vitamin  D deficiency   . NASH (nonalcoholic steatohepatitis)   . DDD (degenerative disc disease)   . Complication of anesthesia   . PONV (postoperative nausea and vomiting)     heel surgery     Home Meds: Outpatient Prescriptions Prior to Visit  Medication Sig Dispense Refill  . Cholecalciferol (VITAMIN D) 2000 UNITS tablet Take 2,000 Units by mouth daily.       . lansoprazole (PREVACID) 30 MG capsule bid prn      . metFORMIN (GLUCOPHAGE) 1000 MG tablet Take 1 tablet (1,000 mg total) by mouth 2 (two) times daily with a meal.  180 tablet  3  . metoprolol succinate (TOPROL-XL) 25 MG 24 hr tablet TAKE 1 TABLET (25 MG TOTAL) BY MOUTH EVERY EVENING.  30 tablet  3  . pioglitazone (ACTOS) 30 MG tablet TAKE ONE TABLET BY MOUTH ONE TIME DAILY  90 tablet  1  . ramipril (ALTACE) 10 MG capsule Take 1 capsule (10 mg total) by mouth every evening.  90 capsule  1  . simvastatin (ZOCOR) 20 MG tablet TAKE 1 TABLET (20 MG TOTAL) BY MOUTH AT BEDTIME.  90 tablet  0  . azithromycin (ZITHROMAX) 250 MG tablet Day 1: Take 2 daily.  Days 2-5: Take 1 daily.  6 tablet  0  . guaiFENesin (MUCINEX) 600 MG 12 hr tablet Take  600 mg by mouth 2 (two) times daily.       No facility-administered medications prior to visit.     Allergies:  Allergies  Allergen Reactions  . Codeine Nausea And Vomiting  . Penicillins Rash    History   Social History  . Marital Status: Married    Spouse Name: N/A    Number of Children: 2  . Years of Education: N/A   Occupational History  . homemaker    Social History Main Topics  . Smoking status: Former Smoker    Types: Cigarettes    Quit date: 07/06/1978  . Smokeless tobacco: Never Used     Comment: intermittent for a few months  . Alcohol Use: Yes     Comment: occasional mixed drink couple per mo  . Drug Use: No  . Sexual Activity: Not on file   Other Topics Concern  . Not on file   Social History Narrative   Lives w/ husband    Family History  Problem Relation Age  of Onset  . Hyperlipidemia Mother   . COPD Father   . Heart disease Neg Hx   . Colon cancer Maternal Aunt   . Colon cancer Maternal Grandfather      Review of Systems:  See HPI for pertinent ROS. All other ROS negative.    Physical Exam: Blood pressure 120/58, pulse 68, temperature 98 F (36.7 C), temperature source Oral, height 5\' 2"  (1.575 m), weight 162 lb (73.483 kg)., Body mass index is 29.62 kg/(m^2). General: WNWD WF. Appears in no acute distress. Neck: Supple. No thyromegaly. No lymphadenopathy. No carotid bruits  Lungs: Clear bilaterally to auscultation without wheezes, rales, or rhonchi. Breathing is unlabored. Heart: RRR with S1 S2. No murmurs, rubs, or gallops. Abdomen: Soft, non-tender, non-distended with normoactive bowel sounds. No hepatomegaly. No rebound/guarding. No obvious abdominal masses. Musculoskeletal:  Strength and tone normal for age. Extremities/Skin: Warm and dry. No clubbing or cyanosis. No edema. No rashes or suspicious lesions. Neuro: Alert and oriented X 3. Moves all extremities spontaneously. Gait is normal. CNII-XII grossly in tact. Psych:  Responds to questions appropriately with a normal affect. Diabetic foot exam: Normal inspection. No skin breakdown. No callus. Normal sensation to light touch with monofilament. 2+ bilateral dorsalis pedis pulses and 2+ bilateral posterior tibial pulses.      ASSESSMENT AND PLAN:  63 y.o. year old female with  1. DM2 (diabetes mellitus, type 2)  Today she reports that she recently had an eye exam. Says that the eye exam was normal and there were no signs of problems related to her diabetes.  Foot Exam is normal.  Kidney function and microalbumin are completely normal.  She is on ACE inhibitor.  She is on statin.  NO ASA SECONDARY TO H/O PUD Microalbumin was performed 03/09/13 and was normal.  2. HTN (hypertension) Blood pressure is at goal. On ACE inhibitor.  3. HLD (hyperlipidemia) 06/2013 decreased her  simvastatin down to 20 mg. Rechecked 09/2013--CMET nml. FLP excellent. Tri-197 HDL--44 LDL--85   4. Unspecified vitamin D deficiency On 2000 units daily. Vitamin D level was normal at last check a 2014.  Cont current dose.  5. GERD (gastroesophageal reflux disease)  6. Encounter for screening colonoscopy--Had Fall 2013--normal--repeat 10 years  7. Rosacea  8. Pelvic exam, Pap smear --Per GYN  9. mammogram : GYN office  10: DEXA: These are ordered and managed by GYN   11. EGD/colonoscopy: Had these in the fall of 2013.  12. Immunizations:  Tetanus: Patient reports this is up to date. Says she had an accident in 2006 and she did have it updated at that time per her report. Pneumovax: She defers   Zostavax: She defers Influenza: defers   TODAY--03/22/2014---CHECKING CMET, FLP, A1C, MICROALBUMIN Since everything is well controlled she can wait 6 months for office visits if she continues to feel good and continues to get good blood sugar readings. Sooner if needed or if blood sugar readings start to rise.    Signed, [address] [place], [place], [facility] 03/22/2014 8:16 AM

## 2014-04-02 ENCOUNTER — Encounter: Payer: Self-pay | Admitting: Family Medicine

## 2014-04-03 ENCOUNTER — Telehealth: Payer: Self-pay | Admitting: Family Medicine

## 2014-04-03 DIAGNOSIS — I10 Essential (primary) hypertension: Secondary | ICD-10-CM

## 2014-04-03 MED ORDER — RAMIPRIL 10 MG PO CAPS: 10.0000 mg | ORAL_CAPSULE | Freq: Every evening | ORAL | Status: DC

## 2014-04-03 MED ORDER — METOPROLOL SUCCINATE ER 25 MG PO TB24: 25.0000 mg | ORAL_TABLET | Freq: Every day | ORAL | Status: DC

## 2014-04-03 NOTE — Telephone Encounter (Signed)
Medication refilled per protocol. 

## 2014-04-04 ENCOUNTER — Telehealth: Payer: Self-pay | Admitting: Family Medicine

## 2014-04-04 DIAGNOSIS — I10 Essential (primary) hypertension: Secondary | ICD-10-CM

## 2014-04-04 MED ORDER — METOPROLOL SUCCINATE ER 25 MG PO TB24: 25.0000 mg | ORAL_TABLET | Freq: Every day | ORAL | Status: DC

## 2014-04-04 MED ORDER — RAMIPRIL 10 MG PO CAPS: 10.0000 mg | ORAL_CAPSULE | Freq: Every evening | ORAL | Status: DC

## 2014-04-04 NOTE — Telephone Encounter (Signed)
Medication refilled per protocol. 

## 2014-04-06 ENCOUNTER — Telehealth: Payer: Self-pay | Admitting: Family Medicine

## 2014-04-06 DIAGNOSIS — I10 Essential (primary) hypertension: Secondary | ICD-10-CM

## 2014-04-06 MED ORDER — METOPROLOL SUCCINATE ER 25 MG PO TB24: 25.0000 mg | ORAL_TABLET | Freq: Every day | ORAL | Status: DC

## 2014-04-06 MED ORDER — RAMIPRIL 10 MG PO CAPS: 10.0000 mg | ORAL_CAPSULE | Freq: Every evening | ORAL | Status: DC

## 2014-04-06 NOTE — Telephone Encounter (Signed)
Medication refilled per protocol. 

## 2014-04-09 ENCOUNTER — Other Ambulatory Visit: Payer: Self-pay | Admitting: *Deleted

## 2014-04-09 ENCOUNTER — Telehealth: Payer: Self-pay | Admitting: Family Medicine

## 2014-04-09 DIAGNOSIS — I10 Essential (primary) hypertension: Secondary | ICD-10-CM

## 2014-04-09 MED ORDER — RAMIPRIL 10 MG PO CAPS: 10.0000 mg | ORAL_CAPSULE | Freq: Every evening | ORAL | Status: DC

## 2014-04-09 MED ORDER — METOPROLOL SUCCINATE ER 25 MG PO TB24: 25.0000 mg | ORAL_TABLET | Freq: Every day | ORAL | Status: DC

## 2014-04-09 NOTE — Telephone Encounter (Signed)
Medication refilled per protocol. 

## 2014-04-09 NOTE — Telephone Encounter (Signed)
Received call from patient. Reports that medication refill for Altace was sent to [facility] instead of [facility].   Prescription sent to pharmacy per patient request.

## 2014-05-02 ENCOUNTER — Other Ambulatory Visit: Payer: Self-pay | Admitting: Physician Assistant

## 2014-05-03 NOTE — Telephone Encounter (Signed)
Refill appropriate and filled per protocol. 

## 2014-09-03 ENCOUNTER — Other Ambulatory Visit: Payer: Self-pay | Admitting: Physician Assistant

## 2014-09-03 NOTE — Telephone Encounter (Signed)
Medication refilled per protocol. 

## 2014-09-20 ENCOUNTER — Encounter: Payer: Self-pay | Admitting: Physician Assistant

## 2014-09-20 ENCOUNTER — Ambulatory Visit (INDEPENDENT_AMBULATORY_CARE_PROVIDER_SITE_OTHER): Payer: 59 | Admitting: Physician Assistant

## 2014-09-20 VITALS — BP 120/70 | HR 60 | Temp 97.8°F | Resp 20 | Ht 62.0 in | Wt 164.0 lb

## 2014-09-20 DIAGNOSIS — L739 Follicular disorder, unspecified: Secondary | ICD-10-CM

## 2014-09-20 DIAGNOSIS — E785 Hyperlipidemia, unspecified: Secondary | ICD-10-CM | POA: Diagnosis not present

## 2014-09-20 DIAGNOSIS — I10 Essential (primary) hypertension: Secondary | ICD-10-CM | POA: Diagnosis not present

## 2014-09-20 DIAGNOSIS — Z23 Encounter for immunization: Secondary | ICD-10-CM | POA: Diagnosis not present

## 2014-09-20 DIAGNOSIS — E1165 Type 2 diabetes mellitus with hyperglycemia: Secondary | ICD-10-CM | POA: Diagnosis not present

## 2014-09-20 DIAGNOSIS — Z1211 Encounter for screening for malignant neoplasm of colon: Secondary | ICD-10-CM

## 2014-09-20 DIAGNOSIS — E559 Vitamin D deficiency, unspecified: Secondary | ICD-10-CM | POA: Diagnosis not present

## 2014-09-20 DIAGNOSIS — K219 Gastro-esophageal reflux disease without esophagitis: Secondary | ICD-10-CM | POA: Diagnosis not present

## 2014-09-20 LAB — COMPLETE METABOLIC PANEL WITH GFR
ALT: 12 U/L (ref 0–35)
AST: 17 U/L (ref 0–37)
Albumin: 4.2 g/dL (ref 3.5–5.2)
Alkaline Phosphatase: 70 U/L (ref 39–117)
BILIRUBIN TOTAL: 0.3 mg/dL (ref 0.2–1.2)
BUN: 17 mg/dL (ref 6–23)
CALCIUM: 9.9 mg/dL (ref 8.4–10.5)
CO2: 25 meq/L (ref 19–32)
Chloride: 103 mEq/L (ref 96–112)
Creat: 0.91 mg/dL (ref 0.50–1.10)
GFR, EST NON AFRICAN AMERICAN: 67 mL/min
GFR, Est African American: 78 mL/min
Glucose, Bld: 104 mg/dL — ABNORMAL HIGH (ref 70–99)
Potassium: 4.6 mEq/L (ref 3.5–5.3)
SODIUM: 139 meq/L (ref 135–145)
Total Protein: 6.9 g/dL (ref 6.0–8.3)

## 2014-09-20 LAB — LIPID PANEL
CHOL/HDL RATIO: 4.1 ratio
Cholesterol: 186 mg/dL (ref 0–200)
HDL: 45 mg/dL — AB (ref >=46)
LDL CALC: 97 mg/dL (ref 0–99)
Triglycerides: 222 mg/dL — ABNORMAL HIGH (ref <150)
VLDL: 44 mg/dL — AB (ref 0–40)

## 2014-09-20 LAB — HEMOGLOBIN A1C
Hgb A1c MFr Bld: 6.2 % — ABNORMAL HIGH (ref <5.7)
MEAN PLASMA GLUCOSE: 131 mg/dL — AB (ref <117)

## 2014-09-20 MED ORDER — BACITRACIN ZINC 500 UNIT/GM EX OINT: 1.0000 "application " | TOPICAL_OINTMENT | Freq: Two times a day (BID) | CUTANEOUS | Status: DC

## 2014-09-20 MED ORDER — OMEPRAZOLE 20 MG PO CPDR: 20.0000 mg | DELAYED_RELEASE_CAPSULE | Freq: Every day | ORAL | Status: DC

## 2014-09-20 NOTE — Addendum Note (Signed)
Addended by: Sheral Flow on: 09/20/2014 08:55 AM   Modules accepted: Orders

## 2014-09-20 NOTE — Progress Notes (Signed)
Patient ID: DAYA DUTT MRN: [id number], DOB: Jan 04, 1951, 64 y.o. Date of Encounter: @DATE @  Chief Complaint:  Chief Complaint  Patient presents with  . Follow-up    6 mos f/u    HPI: 64 y.o. year old white female  presents for routine followup office visit.  Diabetes: I discussed that her last A1c was down and that I was concerned about possible hypoglycemia. However she states that she is getting no low blood sugar readings. She checks her blood sugar fasting once a week. Always gets between 95-105. Never gets less than 80.  is taking all medications as directed including her Actos 30 mg which was most recently added. In the past she was walking 2.5 miles twice a day. However at her visit in December 2014 she reported that the friend who had been walking with her in the evening had stopped.However they were still walking in the morning. Also, in really cold weather in January and February she did not walk on the days when it was really cold weather with ice or snow.  At [place] 3/16 says she has been walking every morning except weekends.  Just joined a gym. Plans to do treadmill and use trainer there--when she tried to do exercises herself, she had major problem b/c of discs in her back.  Feels the trainer can help her do some exercises safely.   She is very careful with her diet and eats low carbohydrate diet.    Hypertension: Taking medications as directed with no adverse effects. No lower extremity edema. No lightheadedness.   Hyperlipidemia: Her last lipid panel her LDL is very low so I had her decrease her simvastatin and she is down to 20 mg as directed. No myalgias or other adverse effects.  Even with exertion she is having no  angina symptoms. She's been feeling good and has no complaints today.   Past Medical History  Diagnosis Date  . Diabetes mellitus   . Hyperlipidemia   . Hypertension   . GERD (gastroesophageal reflux disease)   . Hiatal hernia   . Hiatal hernia    . Rosacea   . Nephrolithiasis   . Pulmonary nodule     no change-1995  . Elevated liver function tests     eval by dr Leonides Grills to fatty liver 2008  . Ulcer     gastric ulcer, antritis-egd 09/06/08  . Vitamin D deficiency   . NASH (nonalcoholic steatohepatitis)   . DDD (degenerative disc disease)   . Complication of anesthesia   . PONV (postoperative nausea and vomiting)     heel surgery     Home Meds: Outpatient Prescriptions Prior to Visit  Medication Sig Dispense Refill  . Cholecalciferol (VITAMIN D) 2000 UNITS tablet Take 2,000 Units by mouth daily.     . metFORMIN (GLUCOPHAGE) 1000 MG tablet Take 1 tablet (1,000 mg total) by mouth 2 (two) times daily with a meal. 180 tablet 3  . metoprolol succinate (TOPROL-XL) 25 MG 24 hr tablet Take 1 tablet (25 mg total) by mouth daily. 90 tablet 1  . pioglitazone (ACTOS) 30 MG tablet TAKE ONE TABLET BY MOUTH ONE TIME DAILY 90 tablet 0  . ramipril (ALTACE) 10 MG capsule Take 1 capsule (10 mg total) by mouth every evening. 90 capsule 1  . simvastatin (ZOCOR) 20 MG tablet TAKE ONE TABLET BY MOUTH AT BEDTIME 90 tablet 1  . lansoprazole (PREVACID) 30 MG capsule bid prn    . azithromycin (ZITHROMAX) 250 MG  tablet Day 1: Take 2 daily.  Days 2-5: Take 1 daily. 6 tablet 0  . guaiFENesin (MUCINEX) 600 MG 12 hr tablet Take 600 mg by mouth 2 (two) times daily.     No facility-administered medications prior to visit.     Allergies:  Allergies  Allergen Reactions  . Codeine Nausea And Vomiting  . Penicillins Rash    History   Social History  . Marital Status: Married    Spouse Name: N/A  . Number of Children: 2  . Years of Education: N/A   Occupational History  . homemaker    Social History Main Topics  . Smoking status: Former Smoker    Types: Cigarettes    Quit date: 07/06/1978  . Smokeless tobacco: Never Used     Comment: intermittent for a few months  . Alcohol Use: Yes     Comment: occasional mixed drink couple per mo   . Drug Use: No  . Sexual Activity: Not on file   Other Topics Concern  . Not on file   Social History Narrative   Lives w/ husband    Family History  Problem Relation Age of Onset  . Hyperlipidemia Mother   . COPD Father   . Heart disease Neg Hx   . Colon cancer Maternal Aunt   . Colon cancer Maternal Grandfather      Review of Systems:  See HPI for pertinent ROS. All other ROS negative.    Physical Exam: Blood pressure 120/70, pulse 60, temperature 97.8 F (36.6 C), temperature source Oral, resp. rate 20, height 5\' 2"  (1.575 m), weight 164 lb (74.39 kg)., Body mass index is 29.99 kg/(m^2). General: WNWD WF. Appears in no acute distress. Neck: Supple. No thyromegaly. No lymphadenopathy. No carotid bruits  Lungs: Clear bilaterally to auscultation without wheezes, rales, or rhonchi. Breathing is unlabored. Heart: RRR with S1 S2. No murmurs, rubs, or gallops. Abdomen: Soft, non-tender, non-distended with normoactive bowel sounds. No hepatomegaly. No rebound/guarding. No obvious abdominal masses. Musculoskeletal:  Strength and tone normal for age. Extremities/Skin: Warm and dry. No clubbing or cyanosis. No edema. No rashes or suspicious lesions. Neuro: Alert and oriented X 3. Moves all extremities spontaneously. Gait is normal. CNII-XII grossly in tact. Psych:  Responds to questions appropriately with a normal affect. Diabetic foot exam: Normal inspection. No skin breakdown. No callus. Normal sensation to light touch with monofilament. 2+ bilateral dorsalis pedis pulses and 2+ bilateral posterior tibial pulses.      ASSESSMENT AND PLAN:  64 y.o. year old female with   1. DM2 (diabetes mellitus, type 2)  Last eye exam was 09/2013. Says that the eye exam was normal and there were no signs of problems related to her diabetes.  At [place] 09/2014 she says she just a card to schedule f/u eye exam and plans to f/u with this. Foot Exam is normal.  Kidney function and microalbumin are  completely normal.  She is on ACE inhibitor.  She is on statin.  NO ASA SECONDARY TO H/O PUD Microalbumin was performed 03/2014 and was normal.  2. HTN (hypertension) Blood pressure is at goal. On ACE inhibitor.  3. HLD (hyperlipidemia) On simvastatin 20 mg. Rechecked 03/2014--CMET nml. FLP excellent.  4. Unspecified vitamin D deficiency On 2000 units daily. Vitamin D level was normal at last check a 2014.  Cont current dose.  5. GERD (gastroesophageal reflux disease)  6. Encounter for screening colonoscopy--Had Fall 2013--normal--repeat 10 years  7. Rosacea  8. Pelvic exam, Pap  smear --Per GYN  9. mammogram : GYN office  10: DEXA: These are ordered and managed by GYN   11. EGD/colonoscopy: Had these in the fall of 2013.  12. Immunizations: Tetanus: Patient reports this is up to date. Says she had an accident in 2006 and she did have it updated at that time per her report.   --09/2014--Discussed updating this--pt agreeable Pneumovax: She defers   Zostavax: She defers---09/2014--says she will see if her new insurance plan covers this---just got new insurance this month. Influenza: defers   Marin Olp [place], [place], [facility] 09/20/2014 8:19 AM

## 2014-09-24 ENCOUNTER — Encounter: Payer: Self-pay | Admitting: *Deleted

## 2014-11-17 ENCOUNTER — Other Ambulatory Visit: Payer: Self-pay | Admitting: Physician Assistant

## 2014-11-19 NOTE — Telephone Encounter (Signed)
Medication refill per protocol °

## 2014-11-30 ENCOUNTER — Ambulatory Visit (INDEPENDENT_AMBULATORY_CARE_PROVIDER_SITE_OTHER): Payer: 59 | Admitting: Family Medicine

## 2014-11-30 ENCOUNTER — Encounter: Payer: Self-pay | Admitting: Family Medicine

## 2014-11-30 VITALS — BP 112/70 | HR 96 | Temp 99.9°F | Resp 18 | Ht 62.0 in | Wt 164.0 lb

## 2014-11-30 DIAGNOSIS — J019 Acute sinusitis, unspecified: Secondary | ICD-10-CM | POA: Diagnosis not present

## 2014-11-30 MED ORDER — LEVOFLOXACIN 500 MG PO TABS: 500.0000 mg | ORAL_TABLET | Freq: Every day | ORAL | Status: DC

## 2014-11-30 NOTE — Progress Notes (Signed)
Subjective:    Patient ID: Denise Meadows, female    DOB: 02/13/1951, 64 y.o.   MRN: [id number]  HPI Patient symptoms began Wednesday with sinus congestion, sinus pressure. Patient is also developed a fever to 100. She reports pain in her right maxillary and right frontal sinus. She has tenderness to palpation in the right maxillary and right frontal sinus. She reports a sinus headache, postnasal drip, body aches, and a nonproductive cough. Past Medical History  Diagnosis Date  . Diabetes mellitus   . Hyperlipidemia   . Hypertension   . GERD (gastroesophageal reflux disease)   . Hiatal hernia   . Hiatal hernia   . Rosacea   . Nephrolithiasis   . Pulmonary nodule     no change-1995  . Elevated liver function tests     eval by dr Leonides Grills to fatty liver 2008  . Ulcer     gastric ulcer, antritis-egd 09/06/08  . Vitamin D deficiency   . NASH (nonalcoholic steatohepatitis)   . DDD (degenerative disc disease)   . Complication of anesthesia   . PONV (postoperative nausea and vomiting)     heel surgery   Past Surgical History  Procedure Laterality Date  . Breast surgery      benign breast nodules 2005  . Cholecystectomy    . Fracture surgery    . Spine surgery    . Esophagogastroduodenoscopy    . Colonoscopy  2008    Dr Raford Pitcher  . Azzie Almas dilation  02/08/2012    Procedure: SAVORY DILATION;  Surgeon: Danie Binder, MD;  Location: [facility];  Service: Endoscopy;  Laterality: N/A;  Venia Minks dilation  02/08/2012    Procedure: MALONEY DILATION;  Surgeon: Danie Binder, MD;  Location: [facility];  Service: Endoscopy;  Laterality: N/A;   Current Outpatient Prescriptions on File Prior to Visit  Medication Sig Dispense Refill  . bacitracin ointment Apply 1 application topically 2 (two) times daily. 28 g 1  . Cholecalciferol (VITAMIN D) 2000 UNITS tablet Take 2,000 Units by mouth daily.     . metFORMIN (GLUCOPHAGE) 1000 MG tablet TAKE 1 TABLET (1,000 MG TOTAL) BY  MOUTH 2 (TWO) TIMES DAILY WITH A MEA L. 180 tablet 1  . metoprolol succinate (TOPROL-XL) 25 MG 24 hr tablet Take 1 tablet (25 mg total) by mouth daily. 90 tablet 1  . omeprazole (PRILOSEC) 20 MG capsule Take 1 capsule (20 mg total) by mouth daily. 90 capsule 3  . pioglitazone (ACTOS) 30 MG tablet TAKE ONE TABLET BY MOUTH ONE TIME DAILY 90 tablet 0  . ramipril (ALTACE) 10 MG capsule Take 1 capsule (10 mg total) by mouth every evening. 90 capsule 1  . simvastatin (ZOCOR) 20 MG tablet TAKE ONE TABLET BY MOUTH AT BEDTIME 90 tablet 1   No current facility-administered medications on file prior to visit.   Allergies  Allergen Reactions  . Codeine Nausea And Vomiting  . Penicillins Rash   History   Social History  . Marital Status: Married    Spouse Name: N/A  . Number of Children: 2  . Years of Education: N/A   Occupational History  . homemaker    Social History Main Topics  . Smoking status: Former Smoker    Types: Cigarettes    Quit date: 07/06/1978  . Smokeless tobacco: Never Used     Comment: intermittent for a few months  . Alcohol Use: Yes     Comment: occasional mixed drink couple per  mo  . Drug Use: No  . Sexual Activity: Not on file   Other Topics Concern  . Not on file   Social History Narrative   Lives w/ husband     Review of Systems  All other systems reviewed and are negative.      Objective:   Physical Exam  Constitutional: She appears well-developed and well-nourished. No distress.  HENT:  Nose: Mucosal edema and rhinorrhea present. Right sinus exhibits maxillary sinus tenderness and frontal sinus tenderness. Left sinus exhibits no maxillary sinus tenderness and no frontal sinus tenderness.  Mouth/Throat: Oropharynx is clear and moist. No oropharyngeal exudate.  Neck: Neck supple.  Cardiovascular: Normal rate, regular rhythm and normal heart sounds.  Exam reveals no gallop and no friction rub.   No murmur heard. Pulmonary/Chest: Effort normal and  breath sounds normal. No respiratory distress. She has no wheezes. She has no rales.  Abdominal: Soft. Bowel sounds are normal. She exhibits no distension. There is no tenderness. There is no rebound and no guarding.  Lymphadenopathy:    She has no cervical adenopathy.  Skin: She is not diaphoretic.  Vitals reviewed.         Assessment & Plan:  Acute rhinosinusitis - Plan: levofloxacin (LEVAQUIN) 500 MG tablet  Patiant has a sinus infection.  I explained that 90% are viral and improve within 7-10 days w/o abx.  I recommended tincture of time. Patient can use Coricidin HBP for congestion. Also encouraged nasal saline. If symptoms worsen she can get a prescription for Levaquin 500 mg by mouth daily for 7 days. However I explained the risk of bacterial resistance and I asked her not to get the anabolic unless symptoms persist greater than 7 days unless symptoms worsen

## 2014-12-07 ENCOUNTER — Other Ambulatory Visit: Payer: Self-pay | Admitting: Physician Assistant

## 2014-12-23 ENCOUNTER — Other Ambulatory Visit: Payer: Self-pay | Admitting: Physician Assistant

## 2014-12-24 NOTE — Telephone Encounter (Signed)
Medication refilled per protocol. 

## 2015-02-06 ENCOUNTER — Encounter: Payer: Self-pay | Admitting: *Deleted

## 2015-02-19 ENCOUNTER — Other Ambulatory Visit: Payer: Self-pay | Admitting: Physician Assistant

## 2015-02-19 NOTE — Telephone Encounter (Signed)
Refill appropriate and filled per protocol. 

## 2015-03-05 ENCOUNTER — Other Ambulatory Visit: Payer: Self-pay | Admitting: Physician Assistant

## 2015-03-06 NOTE — Telephone Encounter (Signed)
Medication refilled per protocol. 

## 2015-03-21 ENCOUNTER — Other Ambulatory Visit: Payer: Self-pay | Admitting: Physician Assistant

## 2015-03-21 NOTE — Telephone Encounter (Signed)
Medication refilled per protocol. 

## 2015-03-25 ENCOUNTER — Encounter: Payer: Self-pay | Admitting: Physician Assistant

## 2015-03-25 ENCOUNTER — Ambulatory Visit (INDEPENDENT_AMBULATORY_CARE_PROVIDER_SITE_OTHER): Payer: 59 | Admitting: Physician Assistant

## 2015-03-25 VITALS — BP 122/70 | HR 76 | Temp 98.3°F | Resp 19 | Ht 60.5 in | Wt 170.0 lb

## 2015-03-25 DIAGNOSIS — K219 Gastro-esophageal reflux disease without esophagitis: Secondary | ICD-10-CM | POA: Diagnosis not present

## 2015-03-25 DIAGNOSIS — L719 Rosacea, unspecified: Secondary | ICD-10-CM | POA: Diagnosis not present

## 2015-03-25 DIAGNOSIS — E1165 Type 2 diabetes mellitus with hyperglycemia: Secondary | ICD-10-CM

## 2015-03-25 DIAGNOSIS — Z1211 Encounter for screening for malignant neoplasm of colon: Secondary | ICD-10-CM

## 2015-03-25 DIAGNOSIS — E785 Hyperlipidemia, unspecified: Secondary | ICD-10-CM | POA: Diagnosis not present

## 2015-03-25 DIAGNOSIS — E559 Vitamin D deficiency, unspecified: Secondary | ICD-10-CM

## 2015-03-25 DIAGNOSIS — I10 Essential (primary) hypertension: Secondary | ICD-10-CM | POA: Diagnosis not present

## 2015-03-25 DIAGNOSIS — R635 Abnormal weight gain: Secondary | ICD-10-CM | POA: Diagnosis not present

## 2015-03-25 DIAGNOSIS — Z23 Encounter for immunization: Secondary | ICD-10-CM

## 2015-03-25 LAB — COMPLETE METABOLIC PANEL WITH GFR
ALK PHOS: 69 U/L (ref 33–130)
ALT: 15 U/L (ref 6–29)
AST: 21 U/L (ref 10–35)
Albumin: 4.1 g/dL (ref 3.6–5.1)
BILIRUBIN TOTAL: 0.4 mg/dL (ref 0.2–1.2)
BUN: 21 mg/dL (ref 7–25)
CALCIUM: 9.5 mg/dL (ref 8.6–10.4)
CO2: 29 mmol/L (ref 20–31)
CREATININE: 0.82 mg/dL (ref 0.50–0.99)
Chloride: 100 mmol/L (ref 98–110)
GFR, EST AFRICAN AMERICAN: 87 mL/min (ref >=60)
GFR, EST NON AFRICAN AMERICAN: 76 mL/min (ref >=60)
Glucose, Bld: 97 mg/dL (ref 70–99)
Potassium: 4.3 mmol/L (ref 3.5–5.3)
Sodium: 137 mmol/L (ref 135–146)
TOTAL PROTEIN: 7 g/dL (ref 6.1–8.1)

## 2015-03-25 LAB — LIPID PANEL
CHOLESTEROL: 177 mg/dL (ref 125–200)
HDL: 43 mg/dL — AB (ref >=46)
LDL Cholesterol: 95 mg/dL (ref <130)
TRIGLYCERIDES: 195 mg/dL — AB (ref <150)
Total CHOL/HDL Ratio: 4.1 Ratio (ref <=5.0)
VLDL: 39 mg/dL — ABNORMAL HIGH (ref <30)

## 2015-03-25 LAB — TSH: TSH: 3.744 u[IU]/mL (ref 0.350–4.500)

## 2015-03-25 LAB — HEMOGLOBIN A1C
Hgb A1c MFr Bld: 6.4 % — ABNORMAL HIGH (ref <5.7)
Mean Plasma Glucose: 137 mg/dL — ABNORMAL HIGH (ref <117)

## 2015-03-25 MED ORDER — METRONIDAZOLE 1 % EX GEL: Freq: Every day | CUTANEOUS | Status: DC

## 2015-03-25 NOTE — Progress Notes (Signed)
Patient ID: [name] MRN: [id number], DOB: 1950/09/29, 64 y.o. Date of Encounter: @DATE @  Chief Complaint:  Chief Complaint  Patient presents with  . 6 mth check up    is fasting  . Medication Refill    HPI: 64 y.o. year old white female  presents for routine followup office visit.  Diabetes: In past, I discussed that her  A1c was down and that I was concerned about possible hypoglycemia. However she stated that she is getting no low blood sugar readings. She checks her blood sugar fasting once a week. Always gets between 95-105. Never gets less than 80.  is taking all medications as directed including her Actos 30 mg which was most recently added. Still states this is the case.  In the past she was walking 2.5 miles twice a day. However at her visit in December 2014 she reported that the friend who had been walking with her in the evening had stopped.However they were still walking in the morning. Also, in really cold weather in January and February she did not walk on the days when it was really cold weather with ice or snow.  At [place] 3/16 says she has been walking every morning except weekends.  Just joined a gym. Plans to do treadmill and use trainer there--when she tried to do exercises herself, she had major problem b/c of discs in her back.  Feels the trainer can help her do some exercises safely.  At [place] 03/2015--says that this did not work out. That even with the trainers help her back got aggravated with everything she tried to do at the gym. Says that she is walking outside and is fine with just doing that. Says that in the past when she had back surgery she was told that she could not work out at a gym but she thought that sensitive been so many years ago that she would be able to do it at this point that that was not the case.  At [place] 03/2015--says that she is gaining weight all of a sudden and does not know why.--- Still doing her walking for exercise and careful with her  diet.  She is very careful with her diet and eats low carbohydrate diet.    Hypertension: Taking medications as directed with no adverse effects. No lower extremity edema. No lightheadedness.   Hyperlipidemia: in past, lipid panel showed her LDL  very low so I had her decrease her simvastatin and she is down to 20 mg as directed. No myalgias or other adverse effects.  Even with exertion she is having no  angina symptoms.    Past Medical History  Diagnosis Date  . Diabetes mellitus   . Hyperlipidemia   . Hypertension   . GERD (gastroesophageal reflux disease)   . Hiatal hernia   . Hiatal hernia   . Rosacea   . Nephrolithiasis   . Pulmonary nodule     no change-1995  . Elevated liver function tests     eval by dr Leonides Grills to fatty liver 2008  . Ulcer     gastric ulcer, antritis-egd 09/06/08  . Vitamin D deficiency   . NASH (nonalcoholic steatohepatitis)   . DDD (degenerative disc disease)   . Complication of anesthesia   . PONV (postoperative nausea and vomiting)     heel surgery     Home Meds: Outpatient Prescriptions Prior to Visit  Medication Sig Dispense Refill  . bacitracin ointment Apply 1 application topically 2 (  two) times daily. 28 g 1  . Cholecalciferol (VITAMIN D) 2000 UNITS tablet Take 2,000 Units by mouth daily.     . metFORMIN (GLUCOPHAGE) 1000 MG tablet TAKE 1 TABLET (1,000 MG TOTAL) BY MOUTH 2 (TWO) TIMES DAILY WITH A MEA L. 180 tablet 1  . metoprolol succinate (TOPROL-XL) 25 MG 24 hr tablet TAKE ONE TABLET BY MOUTH ONE TIME DAILY 90 tablet 0  . omeprazole (PRILOSEC) 20 MG capsule Take 1 capsule (20 mg total) by mouth daily. 90 capsule 3  . pioglitazone (ACTOS) 30 MG tablet TAKE ONE TABLET BY MOUTH ONE TIME DAILY 90 tablet 1  . ramipril (ALTACE) 10 MG capsule TAKE ONE CAPSULE BY MOUTH ONE TIME DAILY IN THE P.M. 90 capsule 0  . simvastatin (ZOCOR) 20 MG tablet TAKE ONE TABLET BY MOUTH AT BEDTIME 90 tablet 0  . levofloxacin (LEVAQUIN) 500 MG tablet  Take 1 tablet (500 mg total) by mouth daily. 7 tablet 0   No facility-administered medications prior to visit.     Allergies:  Allergies  Allergen Reactions  . Codeine Nausea And Vomiting  . Penicillins Rash    Social History   Social History  . Marital Status: Married    Spouse Name: N/A  . Number of Children: 2  . Years of Education: N/A   Occupational History  . homemaker    Social History Main Topics  . Smoking status: Former Smoker    Types: Cigarettes    Quit date: 07/06/1978  . Smokeless tobacco: Never Used     Comment: intermittent for a few months  . Alcohol Use: Yes     Comment: occasional mixed drink couple per mo  . Drug Use: No  . Sexual Activity: Not on file   Other Topics Concern  . Not on file   Social History Narrative   Lives w/ husband    Family History  Problem Relation Age of Onset  . Hyperlipidemia Mother   . COPD Father   . Heart disease Neg Hx   . Colon cancer Maternal Aunt   . Colon cancer Maternal Grandfather      Review of Systems:  See HPI for pertinent ROS. All other ROS negative.    Physical Exam: Blood pressure 122/70, pulse 76, temperature 98.3 F (36.8 C), temperature source Oral, resp. rate 19, height 5' 0.5" (1.537 m), weight 170 lb (77.111 kg)., Body mass index is 32.64 kg/(m^2). General: WNWD WF. Appears in no acute distress. Neck: Supple. No thyromegaly. No lymphadenopathy. No carotid bruits  Lungs: Clear bilaterally to auscultation without wheezes, rales, or rhonchi. Breathing is unlabored. Heart: RRR with S1 S2. No murmurs, rubs, or gallops. Abdomen: Soft, non-tender, non-distended with normoactive bowel sounds. No hepatomegaly. No rebound/guarding. No obvious abdominal masses. Musculoskeletal:  Strength and tone normal for age. Extremities/Skin: Warm and dry.  No edema. No rashes or suspicious lesions. Neuro: Alert and oriented X 3. Moves all extremities spontaneously. Gait is normal. CNII-XII grossly in  tact. Psych:  Responds to questions appropriately with a normal affect. Diabetic foot exam: Normal inspection. No skin breakdown. No callus. Normal sensation to light touch with monofilament. 2+ bilateral dorsalis pedis pulses and 2+ bilateral posterior tibial pulses.      ASSESSMENT AND PLAN:  64 y.o. year old female with   1. DM2 (diabetes mellitus, type 2)  Last eye exam was 09/2013. Says that the eye exam was normal and there were no signs of problems related to her diabetes.  At [facility] 03/2015  she says she is going to schedule follow-up. Says that she did learn that with her new insurance-- as long as the eye doctor documents diabetes that the insurance covers it 100%. Foot Exam is normal.  Kidney function and microalbumin are completely normal.  She is on ACE inhibitor.  She is on statin.  NO ASA SECONDARY TO H/O PUD Microalbumin was performed 03/2014 and was normal. Repeat now--03/25/2015  2. HTN (hypertension) Blood pressure is at goal. On ACE inhibitor.  3. HLD (hyperlipidemia) On simvastatin 20 mg. Rechecked 03/2014, 09/2014--CMET nml. FLP excellent. Repeat now--03/2015  4. Unspecified vitamin D deficiency On 2000 units daily. Vitamin D level was normal at last check a 2014.  Cont current dose. Recheck vitamin D level 03/25/2015  5. Weight Gain At [place] 03/25/15 she reports weight gain despite continued exercise and diet. Check TSH.  5. GERD (gastroesophageal reflux disease) Controlled with current medication.  6. Encounter for screening colonoscopy--Had Fall 2013--normal--repeat 10 years  7. Rosacea This is controlled with MetroGel. Refill sent 03/25/15.  8. Pelvic exam, Pap smear --Per GYN  9. mammogram : GYN office  10: DEXA: These are ordered and managed by GYN   11. EGD/colonoscopy: Had these in the fall of 2013.  12. Immunizations: Tetanus:--  Given here 09/2014 Pneumovax: She defers   Zostavax: She found out her new insurance covers this. Given here  03/25/2015 Influenza: defers  If today's labs remain stable and her blood sugar readings at home remains stable she can wait 6 months for follow-up visit. In her if needed.   Marin Olp [place], [place], [facility] 03/25/2015 8:27 AM

## 2015-03-26 LAB — MICROALBUMIN, URINE: Microalb, Ur: 0.2 mg/dL (ref <2.0)

## 2015-03-26 LAB — VITAMIN D 25 HYDROXY (VIT D DEFICIENCY, FRACTURES): VIT D 25 HYDROXY: 41 ng/mL (ref 30–100)

## 2015-03-28 ENCOUNTER — Encounter: Payer: Self-pay | Admitting: *Deleted

## 2015-04-16 ENCOUNTER — Telehealth: Payer: Self-pay | Admitting: Physician Assistant

## 2015-04-16 NOTE — Telephone Encounter (Signed)
LMTCB

## 2015-04-16 NOTE — Telephone Encounter (Signed)
Patient calling to speak with you regarding the cream for the rosacea for her face and her insurance not covering it  Please call her at [phone]

## 2015-04-17 NOTE — Telephone Encounter (Signed)
Pt says insurance not covering Metrogel for Rosacea.  Said pharmacy was suppose to be sending something here.  Have not received, asked pt to contact them again and have them resend.

## 2015-04-19 NOTE — Telephone Encounter (Signed)
Rec'd Pa req from pharmacy  Has been submitted thru [place].

## 2015-04-22 NOTE — Telephone Encounter (Signed)
rec'd notice from [facility] that medication (metronidazole gel) is as covered drug and member should be able to fill.  Notice faxed to pt pharmacy

## 2015-04-23 ENCOUNTER — Telehealth: Payer: Self-pay | Admitting: Family Medicine

## 2015-04-23 NOTE — Telephone Encounter (Signed)
Pt called about Metrogel.  She called her insurance.  She was told insurance will not cover the metrogel 1% gel for her face.  They will cover the metrogel cream 0.75%.  Can we change RX?  Please advise?

## 2015-04-24 MED ORDER — METRONIDAZOLE 0.75 % EX GEL: 1.0000 "application " | Freq: Two times a day (BID) | CUTANEOUS | Status: DC

## 2015-04-24 NOTE — Telephone Encounter (Signed)
Yes. Approved. 

## 2015-04-24 NOTE — Telephone Encounter (Signed)
New Rx to pharmacy.  Left pt message

## 2015-05-24 ENCOUNTER — Other Ambulatory Visit: Payer: Self-pay | Admitting: Physician Assistant

## 2015-05-24 NOTE — Telephone Encounter (Signed)
Refill appropriate and filled per protocol. 

## 2015-05-28 ENCOUNTER — Other Ambulatory Visit: Payer: Self-pay | Admitting: Physician Assistant

## 2015-05-28 NOTE — Telephone Encounter (Signed)
Medication refilled per protocol. 

## 2015-05-29 ENCOUNTER — Telehealth: Payer: Self-pay | Admitting: Physician Assistant

## 2015-05-29 MED ORDER — METOPROLOL SUCCINATE ER 25 MG PO TB24
25.0000 mg | ORAL_TABLET | Freq: Every day | ORAL | Status: DC
Start: 2015-05-29 — End: 2015-12-18

## 2015-05-29 NOTE — Telephone Encounter (Signed)
Medication refilled per protocol. 

## 2015-05-29 NOTE — Telephone Encounter (Signed)
Patient called requesting refill of her metoprolol succinate (TOPROL-XL) 25 MG 24 hr tablet  Sent to [facility] in [place]. She is completely out

## 2015-08-01 ENCOUNTER — Other Ambulatory Visit: Payer: Self-pay | Admitting: Physician Assistant

## 2015-08-01 NOTE — Telephone Encounter (Signed)
Medication refilled per protocol. 

## 2015-09-05 ENCOUNTER — Other Ambulatory Visit: Payer: Self-pay | Admitting: Physician Assistant

## 2015-09-05 NOTE — Telephone Encounter (Signed)
Medication refilled per protocol. 

## 2015-09-23 ENCOUNTER — Ambulatory Visit: Payer: 59 | Admitting: Physician Assistant

## 2015-09-30 ENCOUNTER — Ambulatory Visit: Payer: 59 | Admitting: Physician Assistant

## 2015-10-03 ENCOUNTER — Other Ambulatory Visit: Payer: Self-pay | Admitting: Physician Assistant

## 2015-10-04 NOTE — Telephone Encounter (Signed)
Medication refilled per protocol. 

## 2015-10-07 ENCOUNTER — Ambulatory Visit (INDEPENDENT_AMBULATORY_CARE_PROVIDER_SITE_OTHER): Payer: BLUE CROSS/BLUE SHIELD | Admitting: Physician Assistant

## 2015-10-07 ENCOUNTER — Encounter: Payer: Self-pay | Admitting: Physician Assistant

## 2015-10-07 VITALS — BP 112/70 | HR 72 | Temp 98.2°F | Resp 18 | Ht 60.5 in | Wt 169.0 lb

## 2015-10-07 DIAGNOSIS — I10 Essential (primary) hypertension: Secondary | ICD-10-CM

## 2015-10-07 DIAGNOSIS — K219 Gastro-esophageal reflux disease without esophagitis: Secondary | ICD-10-CM

## 2015-10-07 DIAGNOSIS — E559 Vitamin D deficiency, unspecified: Secondary | ICD-10-CM

## 2015-10-07 DIAGNOSIS — E785 Hyperlipidemia, unspecified: Secondary | ICD-10-CM

## 2015-10-07 DIAGNOSIS — E1165 Type 2 diabetes mellitus with hyperglycemia: Secondary | ICD-10-CM

## 2015-10-07 LAB — COMPLETE METABOLIC PANEL WITH GFR
ALBUMIN: 4.1 g/dL (ref 3.6–5.1)
ALK PHOS: 73 U/L (ref 33–130)
ALT: 15 U/L (ref 6–29)
AST: 18 U/L (ref 10–35)
BUN: 24 mg/dL (ref 7–25)
CO2: 26 mmol/L (ref 20–31)
CREATININE: 0.89 mg/dL (ref 0.50–0.99)
Calcium: 9.9 mg/dL (ref 8.6–10.4)
Chloride: 103 mmol/L (ref 98–110)
GFR, Est African American: 79 mL/min (ref >=60)
GFR, Est Non African American: 69 mL/min (ref >=60)
GLUCOSE: 102 mg/dL — AB (ref 70–99)
POTASSIUM: 4.6 mmol/L (ref 3.5–5.3)
SODIUM: 139 mmol/L (ref 135–146)
Total Bilirubin: 0.4 mg/dL (ref 0.2–1.2)
Total Protein: 6.8 g/dL (ref 6.1–8.1)

## 2015-10-07 LAB — HEMOGLOBIN A1C
Hgb A1c MFr Bld: 6.4 % — ABNORMAL HIGH (ref <5.7)
Mean Plasma Glucose: 137 mg/dL

## 2015-10-07 NOTE — Progress Notes (Signed)
Patient ID: Denise Meadows MRN: [id number], DOB: [birth year], 65 y.o. Date of Encounter: @DATE @  Chief Complaint:  Chief Complaint  Patient presents with  . 6 mth check up    is fasting    HPI: 65 y.o. year old white female  presents for routine followup office visit.  Diabetes: In past, I discussed that her  A1c was down and that I was concerned about possible hypoglycemia. However she stated that she is getting no low blood sugar readings. She checks her blood sugar fasting once a week. Always gets between 95-105. Never gets less than 80.  is taking all medications as directed including her Actos 30 mg which was most recently added. Still states this is the case.  In the past she was walking 2.5 miles twice a day. However at her visit in December 2014 she reported that the friend who had been walking with her in the evening had stopped.However they were still walking in the morning. Also, in really cold weather in January and February she did not walk on the days when it was really cold weather with ice or snow.  At [place] 3/16 says she has been walking every morning except weekends.  Just joined a gym. Plans to do treadmill and use trainer there--when she tried to do exercises herself, she had major problem b/c of discs in her back.  Feels the trainer can help her do some exercises safely.  At [place] 03/2015--says that this did not work out. That even with the trainers help her back got aggravated with everything she tried to do at the gym. Says that she is walking outside and is fine with just doing that. Says that in the past when she had back surgery she was told that she could not work out at a gym but she thought that sensitive been so many years ago that she would be able to do it at this point that that was not the case.  At [place] 03/2015--says that she is gaining weight all of a sudden and does not know why.--- Still doing her walking for exercise and careful with her diet.  At [place]  10/07/15--She has gotten a piece of exercise equipment that she is using now. Says that she has to move her arms and her legs. She does 15 minutes twice a day 5 days a week.  She is very careful with her diet and eats low carbohydrate diet.    Hypertension: Taking medications as directed with no adverse effects. No lower extremity edema. No lightheadedness.   Hyperlipidemia: in past, lipid panel showed her LDL  very low so I had her decrease her simvastatin and she is down to 20 mg as directed. No myalgias or other adverse effects.  Even with exertion she is having no  angina symptoms.    Past Medical History  Diagnosis Date  . Diabetes mellitus   . Hyperlipidemia   . Hypertension   . GERD (gastroesophageal reflux disease)   . Hiatal hernia   . Hiatal hernia   . Rosacea   . Nephrolithiasis   . Pulmonary nodule     no change-1995  . Elevated liver function tests     eval by dr Leonides Grills to fatty liver 2008  . Ulcer     gastric ulcer, antritis-egd 09/06/08  . Vitamin D deficiency   . NASH (nonalcoholic steatohepatitis)   . DDD (degenerative disc disease)   . Complication of anesthesia   . PONV (postoperative  nausea and vomiting)     heel surgery     Home Meds: Outpatient Prescriptions Prior to Visit  Medication Sig Dispense Refill  . Cholecalciferol (VITAMIN D) 2000 UNITS tablet Take 2,000 Units by mouth daily.     . metFORMIN (GLUCOPHAGE) 1000 MG tablet TAKE 1 TABLET (1,000 MG TOTAL) BY MOUTH 2 (TWO) TIMES DAILY WITH A MEA L. 180 tablet 1  . metoprolol succinate (TOPROL-XL) 25 MG 24 hr tablet Take 1 tablet (25 mg total) by mouth daily. 90 tablet 1  . metroNIDAZOLE (METROGEL) 0.75 % gel Apply 1 application topically 2 (two) times daily. 45 g 5  . omeprazole (PRILOSEC) 20 MG capsule Take 1 capsule (20 mg total) by mouth daily. 90 capsule 3  . pioglitazone (ACTOS) 30 MG tablet TAKE ONE TABLET BY MOUTH ONE TIME DAILY 90 tablet 0  . ramipril (ALTACE) 10 MG capsule TAKE ONE  CAPSULE BY MOUTH ONE TIME DAILY IN THE P.M. 90 capsule 0  . simvastatin (ZOCOR) 20 MG tablet TAKE ONE TABLET BY MOUTH AT BEDTIME 90 tablet 1  . bacitracin ointment Apply 1 application topically 2 (two) times daily. 28 g 1   No facility-administered medications prior to visit.     Allergies:  Allergies  Allergen Reactions  . Codeine Nausea And Vomiting  . Penicillins Rash    Social History   Social History  . Marital Status: Married    Spouse Name: N/A  . Number of Children: 2  . Years of Education: N/A   Occupational History  . homemaker    Social History Main Topics  . Smoking status: Former Smoker    Types: Cigarettes    Quit date: 07/06/1978  . Smokeless tobacco: Never Used     Comment: intermittent for a few months  . Alcohol Use: Yes     Comment: occasional mixed drink couple per mo  . Drug Use: No  . Sexual Activity: Not on file   Other Topics Concern  . Not on file   Social History Narrative   Lives w/ husband    Family History  Problem Relation Age of Onset  . Hyperlipidemia Mother   . COPD Father   . Heart disease Neg Hx   . Colon cancer Maternal Aunt   . Colon cancer Maternal Grandfather      Review of Systems:  See HPI for pertinent ROS. All other ROS negative.    Physical Exam: Blood pressure 112/70, pulse 72, temperature 98.2 F (36.8 C), temperature source Oral, resp. rate 18, height 5' 0.5" (1.537 m), weight 169 lb (76.658 kg)., Body mass index is 32.45 kg/(m^2). General: WNWD WF. Appears in no acute distress. Neck: Supple. No thyromegaly. No lymphadenopathy. No carotid bruits  Lungs: Clear bilaterally to auscultation without wheezes, rales, or rhonchi. Breathing is unlabored. Heart: RRR with S1 S2. No murmurs, rubs, or gallops. Abdomen: Soft, non-tender, non-distended with normoactive bowel sounds. No hepatomegaly. No rebound/guarding. No obvious abdominal masses. Musculoskeletal:  Strength and tone normal for age. Extremities/Skin:  Warm and dry.  No edema. No rashes or suspicious lesions. Neuro: Alert and oriented X 3. Moves all extremities spontaneously. Gait is normal. CNII-XII grossly in tact. Psych:  Responds to questions appropriately with a normal affect. Diabetic foot exam: Normal inspection. No skin breakdown. No callus. Normal sensation to light touch with monofilament. 2+ bilateral dorsalis pedis pulses and 2+ bilateral posterior tibial pulses.      ASSESSMENT AND PLAN:  65 y.o. year old female with  1. DM2 (diabetes mellitus, type 2)  Last eye exam was 09/2013. Says that the eye exam was normal and there were no signs of problems related to her diabetes.  At [place] 03/2015 she says she is going to schedule follow-up. Says that she did learn that with her new insurance-- as long as the eye doctor documents diabetes that the insurance covers it 100%. At [place] 10/07/15--she says that she is going to have to wait until August when she gets on Medicare to do her eye exam. Her current insurance does not cover eye exams. Foot Exam is normal.  Kidney function and microalbumin are completely normal.  She is on ACE inhibitor.  She is on statin.  NO ASA SECONDARY TO H/O PUD Microalbumin was performed 03/2014, 03/25/2015 -- normal.   2. HTN (hypertension) Blood pressure is at goal. On ACE inhibitor.  3. HLD (hyperlipidemia) On simvastatin 20 mg. Rechecked 03/2014, 09/2014--CMET nml. FLP excellent. Repeated--03/2015  4. Unspecified vitamin D deficiency On 2000 units daily. Vitamin D level was normal at last check a 2014.  Cont current dose. Recheck vitamin D level 03/25/2015  5. Weight Gain At [place] 03/25/15 she reports weight gain despite continued exercise and diet. 03/2015--Checked TSH---nml  5. GERD (gastroesophageal reflux disease) Controlled with current medication.  6. Encounter for screening colonoscopy  --Had Fall 2013--normal--repeat 10 years  7. Rosacea This is controlled with MetroGel. Refill sent  03/25/15.  8. Pelvic exam, Pap smear  --Per GYN   9. mammogram : --Per GYN office   10: DEXA:  ----These are ordered and managed by GYN   11. EGD/colonoscopy: --- Had these in the fall of 2013.  12. Immunizations: Tetanus:--  Given here 09/2014 Pneumovax: She defers   Zostavax: Given here 03/25/2015 Influenza: Defers  If today's labs remain stable and her blood sugar readings at home remains stable she can wait 6 months for follow-up visit. In her if needed.   Signed, [address] [place], [place], [facility] 10/07/2015 8:16 AM

## 2015-11-05 ENCOUNTER — Encounter: Payer: Self-pay | Admitting: Family Medicine

## 2015-11-07 ENCOUNTER — Other Ambulatory Visit: Payer: Self-pay | Admitting: Physician Assistant

## 2015-11-07 NOTE — Telephone Encounter (Signed)
Medication refilled per protocol. 

## 2015-11-27 ENCOUNTER — Other Ambulatory Visit: Payer: Self-pay | Admitting: Physician Assistant

## 2015-11-27 NOTE — Telephone Encounter (Signed)
Refill appropriate and filled per protocol. 

## 2015-12-18 ENCOUNTER — Other Ambulatory Visit: Payer: Self-pay | Admitting: Physician Assistant

## 2015-12-18 NOTE — Telephone Encounter (Signed)
Refill appropriate and filled per protocol. 

## 2015-12-26 ENCOUNTER — Encounter: Payer: Self-pay | Admitting: Physician Assistant

## 2015-12-26 ENCOUNTER — Ambulatory Visit (INDEPENDENT_AMBULATORY_CARE_PROVIDER_SITE_OTHER): Payer: BLUE CROSS/BLUE SHIELD | Admitting: Physician Assistant

## 2015-12-26 VITALS — BP 148/78 | HR 72 | Temp 98.0°F | Ht 60.5 in | Wt 170.0 lb

## 2015-12-26 DIAGNOSIS — T148 Other injury of unspecified body region: Secondary | ICD-10-CM | POA: Diagnosis not present

## 2015-12-26 DIAGNOSIS — R531 Weakness: Secondary | ICD-10-CM

## 2015-12-26 DIAGNOSIS — R55 Syncope and collapse: Secondary | ICD-10-CM | POA: Diagnosis not present

## 2015-12-26 DIAGNOSIS — W57XXXA Bitten or stung by nonvenomous insect and other nonvenomous arthropods, initial encounter: Secondary | ICD-10-CM

## 2015-12-26 LAB — CBC WITH DIFFERENTIAL/PLATELET
BASOS ABS: 0 {cells}/uL (ref 0–200)
BASOS PCT: 0 %
EOS ABS: 312 {cells}/uL (ref 15–500)
EOS PCT: 4 %
HCT: 39.8 % (ref 35.0–45.0)
HEMOGLOBIN: 13.2 g/dL (ref 12.0–15.0)
LYMPHS ABS: 1560 {cells}/uL (ref 850–3900)
Lymphocytes Relative: 20 %
MCH: 31.6 pg (ref 27.0–33.0)
MCHC: 33.2 g/dL (ref 32.0–36.0)
MCV: 95.2 fL (ref 80.0–100.0)
MONO ABS: 1014 {cells}/uL — AB (ref 200–950)
MPV: 11.3 fL (ref 7.5–12.5)
Monocytes Relative: 13 %
NEUTROS ABS: 4914 {cells}/uL (ref 1500–7800)
Neutrophils Relative %: 63 %
PLATELETS: 238 10*3/uL (ref 140–400)
RBC: 4.18 MIL/uL (ref 3.80–5.10)
RDW: 13.9 % (ref 11.0–15.0)
WBC: 7.8 10*3/uL (ref 3.8–10.8)

## 2015-12-26 LAB — COMPLETE METABOLIC PANEL WITH GFR
ALK PHOS: 70 U/L (ref 33–130)
ALT: 22 U/L (ref 6–29)
AST: 19 U/L (ref 10–35)
Albumin: 3.8 g/dL (ref 3.6–5.1)
BUN: 16 mg/dL (ref 7–25)
CALCIUM: 9.5 mg/dL (ref 8.6–10.4)
CO2: 26 mmol/L (ref 20–31)
CREATININE: 1.13 mg/dL — AB (ref 0.50–0.99)
Chloride: 102 mmol/L (ref 98–110)
GFR, EST AFRICAN AMERICAN: 59 mL/min — AB (ref >=60)
GFR, Est Non African American: 51 mL/min — ABNORMAL LOW (ref >=60)
Glucose, Bld: 153 mg/dL — ABNORMAL HIGH (ref 70–99)
POTASSIUM: 5.6 mmol/L — AB (ref 3.5–5.3)
Sodium: 139 mmol/L (ref 135–146)
Total Bilirubin: 0.5 mg/dL (ref 0.2–1.2)
Total Protein: 6.7 g/dL (ref 6.1–8.1)

## 2015-12-26 LAB — TSH: TSH: 1.45 m[IU]/L

## 2015-12-26 MED ORDER — DOXYCYCLINE HYCLATE 100 MG PO TABS: 100.0000 mg | ORAL_TABLET | Freq: Two times a day (BID) | ORAL | Status: DC

## 2015-12-26 NOTE — Progress Notes (Signed)
Patient ID: Denise Meadows MRN: [id number], DOB: 1950/10/27, 65 y.o. Date of Encounter: @DATE @  Chief Complaint:  Chief Complaint  Patient presents with  . OTHER    Feels like if stands too long will pass out or vomit    HPI: 65 y.o. year old white female  presents with above.   Says that on Saturday 6/17--she got mosquito bite on left arm. Says on Sunday 6/18 she found a tic on left temple area of her head.   Says since then she has been feeling weak. If stands up for long, feels like she is literally going to pass out.   Note--that --during OV--when she stands to get on exam table, when she stands and wlaks to lab--she does not feel lightheaded or presyncope----says it is only if she stands for longer amount of time-- then feels extremely weak.   Has had no fever.  Has seen no rash.  No headache.  No myalgias  No vomiting.  No diarrhea.  No abdominal pian.   No cough, no nasal mucus, no sore throat.    Past Medical History  Diagnosis Date  . Diabetes mellitus   . Hyperlipidemia   . Hypertension   . GERD (gastroesophageal reflux disease)   . Hiatal hernia   . Hiatal hernia   . Rosacea   . Nephrolithiasis   . Pulmonary nodule     no change-1995  . Elevated liver function tests     eval by dr Leonides Grills to fatty liver 2008  . Ulcer     gastric ulcer, antritis-egd 09/06/08  . Vitamin D deficiency   . NASH (nonalcoholic steatohepatitis)   . DDD (degenerative disc disease)   . Complication of anesthesia   . PONV (postoperative nausea and vomiting)     heel surgery     Home Meds: Outpatient Prescriptions Prior to Visit  Medication Sig Dispense Refill  . Cholecalciferol (VITAMIN D) 2000 UNITS tablet Take 2,000 Units by mouth daily.     . metFORMIN (GLUCOPHAGE) 1000 MG tablet TAKE ONE TABLET BY MOUTH TWICE A DAY WITH MEALS 180 tablet 1  . metoprolol succinate (TOPROL-XL) 25 MG 24 hr tablet TAKE 1 TABLET (25 MG TOTAL) BY MOUTH DAILY. 90 tablet 1  .  metroNIDAZOLE (METROGEL) 0.75 % gel Apply 1 application topically 2 (two) times daily. 45 g 5  . omeprazole (PRILOSEC) 20 MG capsule Take 1 capsule (20 mg total) by mouth daily. 90 capsule 3  . pioglitazone (ACTOS) 30 MG tablet TAKE ONE TABLET BY MOUTH ONE TIME DAILY 90 tablet 1  . ramipril (ALTACE) 10 MG capsule TAKE ONE CAPSULE BY MOUTH ONE TIME DAILY IN THE P.M. 90 capsule 0  . simvastatin (ZOCOR) 20 MG tablet TAKE ONE TABLET BY MOUTH AT BEDTIME 90 tablet 1   No facility-administered medications prior to visit.    Allergies:  Allergies  Allergen Reactions  . Codeine Nausea And Vomiting  . Penicillins Rash    Social History   Social History  . Marital Status: Married    Spouse Name: N/A  . Number of Children: 2  . Years of Education: N/A   Occupational History  . homemaker    Social History Main Topics  . Smoking status: Former Smoker    Types: Cigarettes    Quit date: 07/06/1978  . Smokeless tobacco: Never Used     Comment: intermittent for a few months  . Alcohol Use: Yes     Comment: occasional mixed drink couple  per mo  . Drug Use: No  . Sexual Activity: Not on file   Other Topics Concern  . Not on file   Social History Narrative   Lives w/ husband    Family History  Problem Relation Age of Onset  . Hyperlipidemia Mother   . COPD Father   . Heart disease Neg Hx   . Colon cancer Maternal Aunt   . Colon cancer Maternal Grandfather      Review of Systems:  See HPI for pertinent ROS. All other ROS negative.    Physical Exam: Blood pressure 148/78, pulse 72, temperature 98 F (36.7 C), temperature source Oral, height 5' 0.5" (1.537 m), weight 170 lb (77.111 kg)., Body mass index is 32.64 kg/(m^2). General: WNWD WF. Appears in no acute distress. Head: Normocephalic, atraumatic, eyes without discharge, sclera non-icteric, nares are without discharge. Bilateral auditory canals clear, TM's are without perforation, pearly grey and translucent with reflective  cone of light bilaterally. Oral cavity moist, posterior pharynx without exudate, erythema, peritonsillar abscess.  Neck: Supple. No thyromegaly. No lymphadenopathy. Lungs: Clear bilaterally to auscultation without wheezes, rales, or rhonchi. Breathing is unlabored. Heart: RRR with S1 S2. No murmurs, rubs, or gallops. Abdomen: Soft, non-tender, non-distended with normoactive bowel sounds. No hepatomegaly. No rebound/guarding. No obvious abdominal masses. Musculoskeletal:  Strength and tone normal for age. Extremities/Skin: Warm and dry. Mosquito bite on left forearm.  No other rashes.  Neuro: Alert and oriented X 3. Moves all extremities spontaneously. Gait is normal. CNII-XII grossly in tact. Psych:  Responds to questions appropriately with a normal affect.     ASSESSMENT AND PLAN:  65 y.o. year old female with  1. Weakness - CBC with Differential/Platelet - COMPLETE METABOLIC PANEL WITH GFR - TSH - B. burgdorfi antibodies by WB - [place] mtn spotted fvr abs pnl(IgG+IgM)  2. Pre-syncope - CBC with Differential/Platelet - COMPLETE METABOLIC PANEL WITH GFR - TSH - B. burgdorfi antibodies by WB - [place] mtn spotted fvr abs pnl(IgG+IgM)  3. Tick bite - CBC with Differential/Platelet - COMPLETE METABOLIC PANEL WITH GFR - TSH - B. burgdorfi antibodies by WB - [place] mtn spotted fvr abs pnl(IgG+IgM) - doxycycline (VIBRA-TABS) 100 MG tablet; Take 1 tablet (100 mg total) by mouth 2 (two) times daily.  Dispense: 28 tablet; Refill: 0   HR, BP normal. Do not think presyncope secondary to cardiac issues. Seems to be secondary to weakness.  Suspect viral illness or tic-borne illness.  To go ahead and start empiric treatment with Doxy.  Told her to call me if she develops any additonal symtpoms.  Will f/u with her once I get lab results.   Signed, [address] [place], [place], [facility] 12/26/2015 1:32 PM

## 2015-12-28 LAB — ROCKY MTN SPOTTED FVR ABS PNL(IGG+IGM)
RMSF IGG: NOT DETECTED
RMSF IgM: NOT DETECTED

## 2015-12-31 LAB — LYME ABY, WSTRN BLT IGG & IGM W/BANDS

## 2016-02-26 ENCOUNTER — Other Ambulatory Visit: Payer: Self-pay | Admitting: Family Medicine

## 2016-02-26 DIAGNOSIS — K219 Gastro-esophageal reflux disease without esophagitis: Secondary | ICD-10-CM

## 2016-02-26 MED ORDER — OMEPRAZOLE 20 MG PO CPDR: 20.0000 mg | DELAYED_RELEASE_CAPSULE | Freq: Every day | ORAL | 0 refills | Status: DC

## 2016-02-26 MED ORDER — METFORMIN HCL 1000 MG PO TABS: 1000.0000 mg | ORAL_TABLET | Freq: Two times a day (BID) | ORAL | 0 refills | Status: DC

## 2016-02-26 MED ORDER — RAMIPRIL 10 MG PO CAPS: ORAL_CAPSULE | ORAL | 0 refills | Status: DC

## 2016-02-26 MED ORDER — SIMVASTATIN 20 MG PO TABS: 20.0000 mg | ORAL_TABLET | Freq: Every day | ORAL | 0 refills | Status: DC

## 2016-02-26 MED ORDER — METOPROLOL SUCCINATE ER 25 MG PO TB24: ORAL_TABLET | ORAL | 0 refills | Status: DC

## 2016-02-26 NOTE — Telephone Encounter (Signed)
Medication refilled per protocol. 

## 2016-03-17 DIAGNOSIS — Z1231 Encounter for screening mammogram for malignant neoplasm of breast: Secondary | ICD-10-CM | POA: Diagnosis not present

## 2016-03-17 DIAGNOSIS — Z01419 Encounter for gynecological examination (general) (routine) without abnormal findings: Secondary | ICD-10-CM | POA: Diagnosis not present

## 2016-03-19 LAB — HM MAMMOGRAPHY

## 2016-04-08 ENCOUNTER — Encounter: Payer: Self-pay | Admitting: Physician Assistant

## 2016-04-08 ENCOUNTER — Ambulatory Visit (INDEPENDENT_AMBULATORY_CARE_PROVIDER_SITE_OTHER): Payer: Medicare Other | Admitting: Physician Assistant

## 2016-04-08 VITALS — BP 132/72 | HR 74 | Temp 97.6°F | Resp 16 | Wt 172.0 lb

## 2016-04-08 DIAGNOSIS — E785 Hyperlipidemia, unspecified: Secondary | ICD-10-CM | POA: Diagnosis not present

## 2016-04-08 DIAGNOSIS — E559 Vitamin D deficiency, unspecified: Secondary | ICD-10-CM | POA: Diagnosis not present

## 2016-04-08 DIAGNOSIS — Z23 Encounter for immunization: Secondary | ICD-10-CM

## 2016-04-08 DIAGNOSIS — K219 Gastro-esophageal reflux disease without esophagitis: Secondary | ICD-10-CM

## 2016-04-08 DIAGNOSIS — I10 Essential (primary) hypertension: Secondary | ICD-10-CM

## 2016-04-08 DIAGNOSIS — Z1159 Encounter for screening for other viral diseases: Secondary | ICD-10-CM

## 2016-04-08 DIAGNOSIS — E1165 Type 2 diabetes mellitus with hyperglycemia: Secondary | ICD-10-CM

## 2016-04-08 LAB — LIPID PANEL
Cholesterol: 172 mg/dL (ref 125–200)
HDL: 43 mg/dL — ABNORMAL LOW (ref >=46)
LDL Cholesterol: 94 mg/dL (ref <130)
Total CHOL/HDL Ratio: 4 Ratio (ref <=5.0)
Triglycerides: 175 mg/dL — ABNORMAL HIGH (ref <150)
VLDL: 35 mg/dL — ABNORMAL HIGH (ref <30)

## 2016-04-08 LAB — COMPLETE METABOLIC PANEL WITH GFR
ALBUMIN: 4.2 g/dL (ref 3.6–5.1)
ALK PHOS: 69 U/L (ref 33–130)
ALT: 15 U/L (ref 6–29)
AST: 21 U/L (ref 10–35)
BILIRUBIN TOTAL: 0.4 mg/dL (ref 0.2–1.2)
BUN: 20 mg/dL (ref 7–25)
CO2: 25 mmol/L (ref 20–31)
Calcium: 9.8 mg/dL (ref 8.6–10.4)
Chloride: 104 mmol/L (ref 98–110)
Creat: 0.91 mg/dL (ref 0.50–0.99)
GFR, Est African American: 77 mL/min (ref >=60)
GFR, Est Non African American: 66 mL/min (ref >=60)
GLUCOSE: 100 mg/dL — AB (ref 70–99)
Potassium: 4.7 mmol/L (ref 3.5–5.3)
SODIUM: 140 mmol/L (ref 135–146)
TOTAL PROTEIN: 6.8 g/dL (ref 6.1–8.1)

## 2016-04-08 NOTE — Progress Notes (Signed)
Patient ID: UMI FRUGE MRN: [id number], DOB: [birth year], 65 y.o. Date of Encounter: @DATE @  Chief Complaint:  Chief Complaint  Patient presents with  . Follow-up    HPI: 65 y.o. year old white female  presents for routine followup office visit.  Diabetes: In past, I discussed that her  A1c was down and that I was concerned about possible hypoglycemia. However she stated that she is getting no low blood sugar readings. She checks her blood sugar fasting once a week. Always gets between 95-105. Never gets less than 80.  is taking all medications as directed including her Actos 30 mg which was most recently added. Still states this is the case.  In the past she was walking 2.5 miles twice a day. However at her visit in December 2014 she reported that the friend who had been walking with her in the evening had stopped.However they were still walking in the morning. Also, in really cold weather in January and February she did not walk on the days when it was really cold weather with ice or snow.  At [place] 3/16 says she has been walking every morning except weekends.  Just joined a gym. Plans to do treadmill and use trainer there--when she tried to do exercises herself, she had major problem b/c of discs in her back.  Feels the trainer can help her do some exercises safely.  At [place] 03/2015--says that this did not work out. That even with the trainers help her back got aggravated with everything she tried to do at the gym. Says that she is walking outside and is fine with just doing that. Says that in the past when she had back surgery she was told that she could not work out at a gym but she thought that sensitive been so many years ago that she would be able to do it at this point that that was not the case.  At [place] 03/2015--says that she is gaining weight all of a sudden and does not know why.--- Still doing her walking for exercise and careful with her diet.  At [place] 10/07/15--She has gotten a piece  of exercise equipment that she is using now. Says that she has to move her arms and her legs. She does 15 minutes twice a day 5 days a week. At [place] 04/08/2016---still exercising routinely. She is very careful with her diet and eats low carbohydrate diet.    Hypertension: Taking medications as directed with no adverse effects. No lower extremity edema. No lightheadedness.   Hyperlipidemia: in past, lipid panel showed her LDL  very low so I had her decrease her simvastatin and she is down to 20 mg as directed. No myalgias or other adverse effects.  Even with exertion she is having no  angina symptoms.   At [place] 04/08/2016, wants to check Hep C screen--no other complaints/concerns.  Past Medical History:  Diagnosis Date  . Complication of anesthesia   . DDD (degenerative disc disease)   . Diabetes mellitus   . Elevated liver function tests    eval by dr Leonides Grills to fatty liver 2008  . GERD (gastroesophageal reflux disease)   . Hiatal hernia   . Hiatal hernia   . Hyperlipidemia   . Hypertension   . NASH (nonalcoholic steatohepatitis)   . Nephrolithiasis   . PONV (postoperative nausea and vomiting)    heel surgery  . Pulmonary nodule    no change-1995  . Rosacea   . Ulcer ([place])  gastric ulcer, antritis-egd 09/06/08  . Vitamin D deficiency      Home Meds: Outpatient Medications Prior to Visit  Medication Sig Dispense Refill  . Cholecalciferol (VITAMIN D) 2000 UNITS tablet Take 2,000 Units by mouth daily.     . metFORMIN (GLUCOPHAGE) 1000 MG tablet Take 1 tablet (1,000 mg total) by mouth 2 (two) times daily with a meal. 180 tablet 0  . metoprolol succinate (TOPROL-XL) 25 MG 24 hr tablet TAKE 1 TABLET (25 MG TOTAL) BY MOUTH DAILY. 90 tablet 0  . omeprazole (PRILOSEC) 20 MG capsule Take 1 capsule (20 mg total) by mouth daily. 90 capsule 0  . pioglitazone (ACTOS) 30 MG tablet TAKE ONE TABLET BY MOUTH ONE TIME DAILY 90 tablet 1  . ramipril (ALTACE) 10 MG capsule TAKE ONE CAPSULE  BY MOUTH ONE TIME DAILY IN THE P.M. 90 capsule 0  . simvastatin (ZOCOR) 20 MG tablet Take 1 tablet (20 mg total) by mouth at bedtime. 90 tablet 0  . doxycycline (VIBRA-TABS) 100 MG tablet Take 1 tablet (100 mg total) by mouth 2 (two) times daily. (Patient not taking: Reported on 04/08/2016) 28 tablet 0  . metroNIDAZOLE (METROGEL) 0.75 % gel Apply 1 application topically 2 (two) times daily. (Patient not taking: Reported on 04/08/2016) 45 g 5   No facility-administered medications prior to visit.      Allergies:  Allergies  Allergen Reactions  . Codeine Nausea And Vomiting  . Penicillins Rash    Social History   Social History  . Marital status: Married    Spouse name: N/A  . Number of children: 2  . Years of education: N/A   Occupational History  . homemaker    Social History Main Topics  . Smoking status: Former Smoker    Types: Cigarettes    Quit date: 07/06/1978  . Smokeless tobacco: Never Used     Comment: intermittent for a few months  . Alcohol use Yes     Comment: occasional mixed drink couple per mo  . Drug use: No  . Sexual activity: Not on file   Other Topics Concern  . Not on file   Social History Narrative   Lives w/ husband    Family History  Problem Relation Age of Onset  . Hyperlipidemia Mother   . COPD Father   . Heart disease Neg Hx   . Colon cancer Maternal Aunt   . Colon cancer Maternal Grandfather      Review of Systems:  See HPI for pertinent ROS. All other ROS negative.    Physical Exam: Blood pressure 132/72, pulse 74, temperature 97.6 F (36.4 C), temperature source Oral, resp. rate 16, weight 172 lb (78 kg)., Body mass index is 33.04 kg/m. General: WNWD WF. Appears in no acute distress. Neck: Supple. No thyromegaly. No lymphadenopathy. No carotid bruits  Lungs: Clear bilaterally to auscultation without wheezes, rales, or rhonchi. Breathing is unlabored. Heart: RRR with S1 S2. No murmurs, rubs, or gallops. Abdomen: Soft, non-tender,  non-distended with normoactive bowel sounds. No hepatomegaly. No rebound/guarding. No obvious abdominal masses. Musculoskeletal:  Strength and tone normal for age. Extremities/Skin: Warm and dry.  No edema. No rashes or suspicious lesions. Neuro: Alert and oriented X 3. Moves all extremities spontaneously. Gait is normal. CNII-XII grossly in tact. Psych:  Responds to questions appropriately with a normal affect. Diabetic foot exam: Normal inspection. No skin breakdown. No callus. Normal sensation to light touch with monofilament. 2+ bilateral dorsalis pedis pulses and 2+ bilateral posterior tibial  pulses.      ASSESSMENT AND PLAN:  65 y.o. year old female with    Need for hepatitis C screening test - Hepatitis C Antibody    Type 2 diabetes mellitus with hyperglycemia, without long-term current use of insulin ([place]) Last eye exam was 09/2013. Says that the eye exam was normal and there were no signs of problems related to her diabetes.  At [place] 03/2015 she says she is going to schedule follow-up. Says that she did learn that with her new insurance-- as long as the eye doctor documents diabetes that the insurance covers it 100%. At [place] 10/07/15--she says that she is going to have to wait until August when she gets on Medicare to do her eye exam. Her current insurance does not cover eye exams. At [place] 04/08/2016---Says she is now on Medicare---says she is "trying to find an eye doctor for both her and her husband"----aware needs to f/u with scheduling diabetic eye exam. Foot Exam is normal.  Kidney function and microalbumin are completely normal.  She is on ACE inhibitor.  She is on statin.  NO ASA SECONDARY TO H/O PUD Microalbumin was performed 03/2014, 03/25/2015 -- normal.   2. HTN (hypertension) Blood pressure is at goal. On ACE inhibitor. Check lab to monitor.   3. HLD (hyperlipidemia) On simvastatin 20 mg. Rechecked 03/2014, 09/2014--CMET nml. FLP excellent. Repeated--03/2015  4. Unspecified  vitamin D deficiency On 2000 units daily. Vitamin D level was normal at last check a 2014.  Cont current dose. Recheck vitamin D level 03/25/2015---level was good--41---will wait to recheck---last 2 levels were 53 ---> 41  5. Weight Gain At [place] 03/25/15 she reports weight gain despite continued exercise and diet. 03/2015--Checked TSH---nml  5. GERD (gastroesophageal reflux disease) Controlled with current medication.  6. Encounter for screening colonoscopy  --Had Fall 2013--normal--repeat 10 years  7. Rosacea This is controlled with MetroGel. Refill sent 03/25/15.  8. Pelvic exam, Pap smear  --Per GYN   9. mammogram : --Per GYN office   10: DEXA:  ----These are ordered and managed by GYN   11. EGD/colonoscopy: --- Had these in the fall of 2013.  12. Immunizations: Tetanus:--  Given here 09/2014 Pneumovax: She defers   Zostavax: Given here 03/25/2015 Influenza: Given here 04/08/2016  If today's labs remain stable and her blood sugar readings at home remains stable she can wait 6 months for follow-up visit. In her if needed.   Signed, [address] [place], [place], [facility] 04/08/2016 8:57 AM

## 2016-04-09 LAB — HEMOGLOBIN A1C
HEMOGLOBIN A1C: 6.4 % — AB (ref <5.7)
MEAN PLASMA GLUCOSE: 137 mg/dL

## 2016-04-09 LAB — MICROALBUMIN, URINE: MICROALB UR: 0.2 mg/dL

## 2016-04-09 LAB — HEPATITIS C ANTIBODY: HCV AB: NEGATIVE

## 2016-05-19 ENCOUNTER — Other Ambulatory Visit: Payer: Self-pay

## 2016-05-19 MED ORDER — PIOGLITAZONE HCL 30 MG PO TABS: 30.0000 mg | ORAL_TABLET | Freq: Every day | ORAL | 1 refills | Status: DC

## 2016-05-19 NOTE — Telephone Encounter (Signed)
Rx filled per protocol  

## 2016-07-10 ENCOUNTER — Other Ambulatory Visit: Payer: Self-pay

## 2016-07-10 MED ORDER — RAMIPRIL 10 MG PO CAPS: ORAL_CAPSULE | ORAL | 0 refills | Status: DC

## 2016-07-10 MED ORDER — METOPROLOL SUCCINATE ER 25 MG PO TB24: ORAL_TABLET | ORAL | 0 refills | Status: DC

## 2016-07-10 NOTE — Telephone Encounter (Signed)
rx filled per protocol  

## 2016-08-07 ENCOUNTER — Telehealth: Payer: Self-pay

## 2016-08-07 MED ORDER — SIMVASTATIN 20 MG PO TABS: 20.0000 mg | ORAL_TABLET | Freq: Every day | ORAL | 0 refills | Status: DC

## 2016-08-07 NOTE — Telephone Encounter (Signed)
Rx filled per protocol  

## 2016-09-07 ENCOUNTER — Other Ambulatory Visit: Payer: Self-pay

## 2016-09-07 MED ORDER — PIOGLITAZONE HCL 30 MG PO TABS: 30.0000 mg | ORAL_TABLET | Freq: Every day | ORAL | 0 refills | Status: DC

## 2016-09-07 NOTE — Telephone Encounter (Signed)
Refill appropriate 

## 2016-09-21 ENCOUNTER — Encounter: Payer: Self-pay | Admitting: Physician Assistant

## 2016-09-21 ENCOUNTER — Ambulatory Visit (INDEPENDENT_AMBULATORY_CARE_PROVIDER_SITE_OTHER): Payer: Medicare Other | Admitting: Physician Assistant

## 2016-09-21 VITALS — BP 140/80 | HR 68 | Temp 98.4°F | Resp 18 | Wt 177.0 lb

## 2016-09-21 DIAGNOSIS — I10 Essential (primary) hypertension: Secondary | ICD-10-CM

## 2016-09-21 MED ORDER — AMLODIPINE BESYLATE 5 MG PO TABS: 5.0000 mg | ORAL_TABLET | Freq: Every day | ORAL | 3 refills | Status: DC

## 2016-09-21 NOTE — Progress Notes (Signed)
Patient ID: Denise Meadows MRN: [id number], DOB: 1950/08/20, 66 y.o. Date of Encounter: 09/21/2016, 1:03 PM    Chief Complaint:  Chief Complaint  Patient presents with  . Hypertension    has been high     HPI: 66 y.o. year old female presents with above.   Says that Thursday a week ago she was in the kitchen and just didn't feel right. Didn't know if her blood sugar was low so she checked that but it was normal. Checked her blood pressure and got 180/105. Checked her blood pressure again the following day and it was better. Last week she had to take her neighbor to their doctor and she has their nurse to check her blood pressure and they got 148/88. Saturday she was in the car with her husband and developed a headache. Says that it was in both of her temples and the crown of her head. She has checked her blood pressure at other times and got ~ 140/80. Has checked her husband's blood pressure to make sure that her cuff was reading accurately and his BP has been reading good and consistent with his usual blood pressure. When she has had symptoms and checked her blood sugar -- blood sugar has been fine. I reviewed that she usually walks for exercise routinely. Says that this weekend they had company so she did not get to walk outside and last week they were only able to walk a couple of days because the weather was bad the other days.  When she has done her walks and when she has done other physical exertion such as going to the store etc. she has had no angina symptoms and no other symptoms with exertion. With these headaches, has had no stroke type symptoms. No weakness in any extremity. No slurred speech.     Home Meds:   Outpatient Medications Prior to Visit  Medication Sig Dispense Refill  . Cholecalciferol (VITAMIN D) 2000 UNITS tablet Take 2,000 Units by mouth daily.     . metFORMIN (GLUCOPHAGE) 1000 MG tablet Take 1 tablet (1,000 mg total) by mouth 2 (two) times daily with a meal. 180  tablet 0  . metoprolol succinate (TOPROL-XL) 25 MG 24 hr tablet TAKE 1 TABLET (25 MG TOTAL) BY MOUTH DAILY. 90 tablet 0  . metroNIDAZOLE (METROGEL) 0.75 % gel Apply 1 application topically 2 (two) times daily. 45 g 5  . omeprazole (PRILOSEC) 20 MG capsule Take 1 capsule (20 mg total) by mouth daily. 90 capsule 0  . pioglitazone (ACTOS) 30 MG tablet Take 1 tablet (30 mg total) by mouth daily. 90 tablet 0  . ramipril (ALTACE) 10 MG capsule TAKE ONE CAPSULE BY MOUTH ONE TIME DAILY IN THE P.M. 90 capsule 0  . simvastatin (ZOCOR) 20 MG tablet Take 1 tablet (20 mg total) by mouth at bedtime. 90 tablet 0  . doxycycline (VIBRA-TABS) 100 MG tablet Take 1 tablet (100 mg total) by mouth 2 (two) times daily. (Patient not taking: Reported on 04/08/2016) 28 tablet 0   No facility-administered medications prior to visit.     Allergies:  Allergies  Allergen Reactions  . Codeine Nausea And Vomiting  . Penicillins Rash      Review of Systems: See HPI for pertinent ROS. All other ROS negative.    Physical Exam: Blood pressure 140/80, pulse 68, temperature 98.4 F (36.9 C), temperature source Oral, resp. rate 18, weight 177 lb (80.3 kg)., Body mass index is 34 kg/m. General:  WNWD WF. Appears in no acute distress. Neck: Supple. No thyromegaly. No lymphadenopathy. No carotid bruits. Lungs: Clear bilaterally to auscultation without wheezes, rales, or rhonchi. Breathing is unlabored. Heart: Regular rhythm. No murmurs, rubs, or gallops. Msk:  Strength and tone normal for age. Extremities/Skin: Warm and dry.  Neuro: Alert and oriented X 3. Moves all extremities spontaneously. Gait is normal. CNII-XII grossly in tact. Psych:  Responds to questions appropriately with a normal affect.     ASSESSMENT AND PLAN:  66 y.o. year old female with  1. Essential hypertension Cont current BP medications. Add amlodipine 5 mg daily. She has routine visit scheduled with me in just a couple of weeks and will keep that  appointment. F/U sooner if needed. Will continue to monitor her blood pressure at home in the interim and can follow-up sooner if needed. - amLODipine (NORVASC) 5 MG tablet; Take 1 tablet (5 mg total) by mouth daily.  Dispense: 30 tablet; Refill: 3   Signed, [address] [place], [place], [facility] 09/21/2016 1:03 PM

## 2016-10-07 ENCOUNTER — Ambulatory Visit: Payer: Medicare Other | Admitting: Physician Assistant

## 2016-10-07 ENCOUNTER — Encounter: Payer: Self-pay | Admitting: Physician Assistant

## 2016-10-07 ENCOUNTER — Ambulatory Visit (INDEPENDENT_AMBULATORY_CARE_PROVIDER_SITE_OTHER): Payer: Medicare Other | Admitting: Physician Assistant

## 2016-10-07 VITALS — BP 118/70 | HR 62 | Temp 97.8°F | Resp 14 | Wt 178.8 lb

## 2016-10-07 DIAGNOSIS — F410 Panic disorder [episodic paroxysmal anxiety] without agoraphobia: Secondary | ICD-10-CM

## 2016-10-07 DIAGNOSIS — E559 Vitamin D deficiency, unspecified: Secondary | ICD-10-CM

## 2016-10-07 DIAGNOSIS — L719 Rosacea, unspecified: Secondary | ICD-10-CM | POA: Diagnosis not present

## 2016-10-07 DIAGNOSIS — E785 Hyperlipidemia, unspecified: Secondary | ICD-10-CM

## 2016-10-07 DIAGNOSIS — K219 Gastro-esophageal reflux disease without esophagitis: Secondary | ICD-10-CM

## 2016-10-07 DIAGNOSIS — I10 Essential (primary) hypertension: Secondary | ICD-10-CM | POA: Diagnosis not present

## 2016-10-07 DIAGNOSIS — E119 Type 2 diabetes mellitus without complications: Secondary | ICD-10-CM

## 2016-10-07 LAB — COMPLETE METABOLIC PANEL WITH GFR
ALBUMIN: 4 g/dL (ref 3.6–5.1)
ALK PHOS: 70 U/L (ref 33–130)
ALT: 15 U/L (ref 6–29)
AST: 17 U/L (ref 10–35)
BUN: 24 mg/dL (ref 7–25)
CALCIUM: 9.8 mg/dL (ref 8.6–10.4)
CHLORIDE: 104 mmol/L (ref 98–110)
CO2: 25 mmol/L (ref 20–31)
CREATININE: 1.06 mg/dL — AB (ref 0.50–0.99)
GFR, EST AFRICAN AMERICAN: 64 mL/min (ref >=60)
GFR, Est Non African American: 55 mL/min — ABNORMAL LOW (ref >=60)
Glucose, Bld: 104 mg/dL — ABNORMAL HIGH (ref 70–99)
POTASSIUM: 4.7 mmol/L (ref 3.5–5.3)
Sodium: 139 mmol/L (ref 135–146)
Total Bilirubin: 0.4 mg/dL (ref 0.2–1.2)
Total Protein: 6.9 g/dL (ref 6.1–8.1)

## 2016-10-07 LAB — LIPID PANEL
Cholesterol: 170 mg/dL (ref <200)
HDL: 47 mg/dL — ABNORMAL LOW (ref >50)
LDL CALC: 88 mg/dL (ref <100)
TRIGLYCERIDES: 174 mg/dL — AB (ref <150)
Total CHOL/HDL Ratio: 3.6 Ratio (ref <5.0)
VLDL: 35 mg/dL — ABNORMAL HIGH (ref <30)

## 2016-10-07 MED ORDER — PIOGLITAZONE HCL 30 MG PO TABS: 30.0000 mg | ORAL_TABLET | Freq: Every day | ORAL | 2 refills | Status: DC

## 2016-10-07 MED ORDER — RAMIPRIL 10 MG PO CAPS: ORAL_CAPSULE | ORAL | 2 refills | Status: DC

## 2016-10-07 MED ORDER — AMLODIPINE BESYLATE 5 MG PO TABS: 5.0000 mg | ORAL_TABLET | Freq: Every day | ORAL | 2 refills | Status: DC

## 2016-10-07 MED ORDER — VITAMIN D 50 MCG (2000 UT) PO TABS: 2000.0000 [IU] | ORAL_TABLET | Freq: Every day | ORAL | 2 refills | Status: AC

## 2016-10-07 MED ORDER — METRONIDAZOLE 0.75 % EX GEL: 1.0000 "application " | Freq: Two times a day (BID) | CUTANEOUS | 5 refills | Status: DC

## 2016-10-07 MED ORDER — OMEPRAZOLE 20 MG PO CPDR: 20.0000 mg | DELAYED_RELEASE_CAPSULE | Freq: Every day | ORAL | 2 refills | Status: DC

## 2016-10-07 MED ORDER — METOPROLOL SUCCINATE ER 25 MG PO TB24: ORAL_TABLET | ORAL | 2 refills | Status: DC

## 2016-10-07 MED ORDER — METFORMIN HCL 1000 MG PO TABS: 1000.0000 mg | ORAL_TABLET | Freq: Two times a day (BID) | ORAL | 2 refills | Status: DC

## 2016-10-07 MED ORDER — DIAZEPAM 2 MG PO TABS: 2.0000 mg | ORAL_TABLET | Freq: Four times a day (QID) | ORAL | 0 refills | Status: DC | PRN

## 2016-10-07 MED ORDER — SIMVASTATIN 20 MG PO TABS: 20.0000 mg | ORAL_TABLET | Freq: Every day | ORAL | 2 refills | Status: DC

## 2016-10-07 NOTE — Progress Notes (Signed)
Patient ID: LORILEI HORAN MRN: [id number], DOB: January 12, 1951, 66 y.o. Date of Encounter: @DATE @  Chief Complaint:  Chief Complaint  Patient presents with  . Hypertension    6 month f/u  . Hyperlipidemia    HPI: 66 y.o. year old white female  presents for routine followup office visit.  Diabetes: In past, I discussed that her  A1c was down and that I was concerned about possible hypoglycemia. However she stated that she is getting no low blood sugar readings. She checks her blood sugar fasting once a week. Always gets between 95-105. Never gets less than 80.  is taking all medications as directed including her Actos 30 mg which was most recently added. Still states this is the case.  In the past she was walking 2.5 miles twice a day. However at her visit in December 2014 she reported that the friend who had been walking with her in the evening had stopped.However they were still walking in the morning. Also, in really cold weather in January and February she did not walk on the days when it was really cold weather with ice or snow.  At [place] 3/16 says she has been walking every morning except weekends.  Just joined a gym. Plans to do treadmill and use trainer there--when she tried to do exercises herself, she had major problem b/c of discs in her back.  Feels the trainer can help her do some exercises safely.  At [place] 03/2015--says that this did not work out. That even with the trainers help her back got aggravated with everything she tried to do at the gym. Says that she is walking outside and is fine with just doing that. Says that in the past when she had back surgery she was told that she could not work out at a gym but she thought that sensitive been so many years ago that she would be able to do it at this point that that was not the case.  At [place] 03/2015--says that she is gaining weight all of a sudden and does not know why.--- Still doing her walking for exercise and careful with her  diet.  At [place] 10/07/15--She has gotten a piece of exercise equipment that she is using now. Says that she has to move her arms and her legs. She does 15 minutes twice a day 5 days a week. At [place] 04/08/2016---still exercising routinely. She is very careful with her diet and eats low carbohydrate diet.   10/07/2016: Since her last routine visit with me, she had an extra visit with me on 09/21/16. At that time she reported that she had some episodes of "just not feeling right". At the time of the episodes she had checked her blood sugar and that was normal. She then checked her blood pressure with that first episode and got a very high reading of 180/105. She checked her blood pressure at other times and got 148/88 and the other readings were around 140/80. So, at that visit I added Norvasc 5 mg daily. Today she reports that she is taking the Norvasc 5 mg and this is causing no adverse effects. She states that she has had a couple more episodes of "just not feeling right" and actually thinks that it may be episodes of anxiety. Says that when it occurs she feels anxious and uptight and tension headache. Says that she had an old bottle of Valium and took half of a Valium (thinks the pill was either 2mg  or  5mg  dose and she took 1/2) the last time she felt the symptoms and the symptoms resolved. No other concerns to address today. Says that there is no specific increased stress going on in her life right now. She is not having generalized anxiety. Does not feel like she has having uncontrollable mind racing. Says that she has had some trouble sleeping for years but this has not worsened or changed.  Diabetes: She continues to take her diabetic medicines as directed. No adverse effects. She continues to exercise on a routine basis Hypertension: Taking medications as directed with no adverse effects. No lower extremity edema. No lightheadedness.   Hyperlipidemia: in past, lipid panel showed her LDL  very low so I had  her decrease her simvastatin and she is down to 20 mg as directed. No myalgias or other adverse effects.  Even with exertion she is having no  angina symptoms.   At [place] 04/08/2016, wants to check Hep C screen--no other complaints/concerns.  Past Medical History:  Diagnosis Date  . Complication of anesthesia   . DDD (degenerative disc disease)   . Diabetes mellitus   . Elevated liver function tests    eval by dr Leonides Grills to fatty liver 2008  . GERD (gastroesophageal reflux disease)   . Hiatal hernia   . Hiatal hernia   . Hyperlipidemia   . Hypertension   . NASH (nonalcoholic steatohepatitis)   . Nephrolithiasis   . PONV (postoperative nausea and vomiting)    heel surgery  . Pulmonary nodule    no change-1995  . Rosacea   . Ulcer ([place])    gastric ulcer, antritis-egd 09/06/08  . Vitamin D deficiency      Home Meds: Outpatient Medications Prior to Visit  Medication Sig Dispense Refill  . amLODipine (NORVASC) 5 MG tablet Take 1 tablet (5 mg total) by mouth daily. 30 tablet 3  . Cholecalciferol (VITAMIN D) 2000 UNITS tablet Take 2,000 Units by mouth daily.     . metFORMIN (GLUCOPHAGE) 1000 MG tablet Take 1 tablet (1,000 mg total) by mouth 2 (two) times daily with a meal. 180 tablet 0  . metoprolol succinate (TOPROL-XL) 25 MG 24 hr tablet TAKE 1 TABLET (25 MG TOTAL) BY MOUTH DAILY. 90 tablet 0  . metroNIDAZOLE (METROGEL) 0.75 % gel Apply 1 application topically 2 (two) times daily. 45 g 5  . omeprazole (PRILOSEC) 20 MG capsule Take 1 capsule (20 mg total) by mouth daily. 90 capsule 0  . pioglitazone (ACTOS) 30 MG tablet Take 1 tablet (30 mg total) by mouth daily. 90 tablet 0  . ramipril (ALTACE) 10 MG capsule TAKE ONE CAPSULE BY MOUTH ONE TIME DAILY IN THE P.M. 90 capsule 0  . simvastatin (ZOCOR) 20 MG tablet Take 1 tablet (20 mg total) by mouth at bedtime. 90 tablet 0   No facility-administered medications prior to visit.      Allergies:  Allergies  Allergen Reactions   . Codeine Nausea And Vomiting  . Penicillins Rash    Social History   Social History  . Marital status: Married    Spouse name: N/A  . Number of children: 2  . Years of education: N/A   Occupational History  . homemaker    Social History Main Topics  . Smoking status: Former Smoker    Types: Cigarettes    Quit date: 07/06/1978  . Smokeless tobacco: Never Used     Comment: intermittent for a few months  . Alcohol use Yes  Comment: occasional mixed drink couple per mo  . Drug use: No  . Sexual activity: Not on file   Other Topics Concern  . Not on file   Social History Narrative   Lives w/ husband    Family History  Problem Relation Age of Onset  . Hyperlipidemia Mother   . COPD Father   . Heart disease Neg Hx   . Colon cancer Maternal Aunt   . Colon cancer Maternal Grandfather      Review of Systems:  See HPI for pertinent ROS. All other ROS negative.    Physical Exam: Blood pressure 118/70, pulse 62, temperature 97.8 F (36.6 C), temperature source Oral, resp. rate 14, weight 178 lb 12.8 oz (81.1 kg), SpO2 95 %., Body mass index is 34.34 kg/m. General: WNWD WF. Appears in no acute distress. Neck: Supple. No thyromegaly. No lymphadenopathy. No carotid bruits  Lungs: Clear bilaterally to auscultation without wheezes, rales, or rhonchi. Breathing is unlabored. Heart: RRR with S1 S2. No murmurs, rubs, or gallops. Abdomen: Soft, non-tender, non-distended with normoactive bowel sounds. No hepatomegaly. No rebound/guarding. No obvious abdominal masses. Musculoskeletal:  Strength and tone normal for age. Extremities/Skin: Warm and dry.  No edema. No rashes or suspicious lesions. Neuro: Alert and oriented X 3. Moves all extremities spontaneously. Gait is normal. CNII-XII grossly in tact. Psych:  Responds to questions appropriately with a normal affect. Diabetic foot exam: Normal inspection. No skin breakdown. No callus. Normal sensation to light touch with  monofilament. 2+ bilateral dorsalis pedis pulses and 2+ bilateral posterior tibial pulses.      ASSESSMENT AND PLAN:  66 y.o. year old female with   Panic anxiety syndrome She is to take the Valium when she has episodes. If symptoms resolve with the Valium, then reassured that this is secondary to panic/anxiety. If symptoms do not resolve with Valium or symptoms change/worsen, then follow-up. - diazepam (VALIUM) 2 MG tablet; Take 1 tablet (2 mg total) by mouth every 6 (six) hours as needed for anxiety.  Dispense: 30 tablet; Refill: 0    Type 2 diabetes mellitus with hyperglycemia, without long-term current use of insulin ([place]) Last eye exam was 09/2013. Says that the eye exam was normal and there were no signs of problems related to her diabetes.  At [place] 03/2015 she says she is going to schedule follow-up. Says that she did learn that with her new insurance-- as long as the eye doctor documents diabetes that the insurance covers it 100%. At [place] 10/07/15--she says that she is going to have to wait until August when she gets on Medicare to do her eye exam. Her current insurance does not cover eye exams. At [place] 04/08/2016---Says she is now on Medicare---says she is "trying to find an eye doctor for both her and her husband"----aware needs to f/u with scheduling diabetic eye exam. Foot Exam is normal.  Kidney function and microalbumin are completely normal.  She is on ACE inhibitor.  She is on statin.  NO ASA SECONDARY TO H/O PUD Microalbumin was performed 03/2014, 03/25/2015 -- normal.   10/07/2016---check A1C, CMET to monitor  2. HTN (hypertension) 10/07/2016: Blood pressure is at goal. On ACE inhibitor. Check lab to monitor.   3. HLD (hyperlipidemia) 10/07/2016: On simvastatin 20 mg. Check lab to monitor  4. Unspecified vitamin D deficiency 10/07/2016:On 2000 units daily.   Cont current dose.      Recheck vitamin D level 03/25/2015---level was good--41---will wait to recheck---last 2 levels were 53  --->  41  5. Weight Gain At [place] 03/25/15 she reports weight gain despite continued exercise and diet. 03/2015--Checked TSH---nml  5. GERD (gastroesophageal reflux disease) 10/07/2016: Controlled with current medication.  6. Encounter for screening colonoscopy  10/07/2016:  --Had Fall 2013--normal--repeat 10 years  7. Rosacea 10/07/2016:  This is controlled with MetroGel. Refill sent 03/25/15.  8. Pelvic exam, Pap smear  10/07/2016: --Per GYN   9. mammogram : 10/07/2016: --Per GYN office   10: DEXA:  10/07/2016:----These are ordered and managed by GYN   11. EGD/colonoscopy: 10/07/2016:--- Had these in the fall of 2013.  12. Immunizations: Tetanus:--  Given here 09/2014 Pneumovax: She defers   Zostavax: Given here 03/25/2015 Influenza: Given here 04/08/2016  If today's labs remain stable and her blood sugar readings at home remains stable she can wait 6 months for follow-up visit. In her if needed.   Marin [place], [place], [facility] 10/07/2016 8:28 AM

## 2016-10-07 NOTE — Addendum Note (Signed)
Addended by: Vonna Kotyk A on: 10/07/2016 09:00 AM   Modules accepted: Orders

## 2016-10-08 LAB — HEMOGLOBIN A1C
HEMOGLOBIN A1C: 6 % — AB (ref <5.7)
MEAN PLASMA GLUCOSE: 126 mg/dL

## 2016-10-15 ENCOUNTER — Ambulatory Visit: Payer: Medicare Other | Admitting: Physician Assistant

## 2017-03-22 DIAGNOSIS — E119 Type 2 diabetes mellitus without complications: Secondary | ICD-10-CM | POA: Diagnosis not present

## 2017-03-22 DIAGNOSIS — H40033 Anatomical narrow angle, bilateral: Secondary | ICD-10-CM | POA: Diagnosis not present

## 2017-03-22 LAB — HM DIABETES EYE EXAM

## 2017-04-08 ENCOUNTER — Encounter: Payer: Self-pay | Admitting: *Deleted

## 2017-04-08 ENCOUNTER — Ambulatory Visit (INDEPENDENT_AMBULATORY_CARE_PROVIDER_SITE_OTHER): Payer: Medicare Other | Admitting: Physician Assistant

## 2017-04-08 ENCOUNTER — Encounter: Payer: Self-pay | Admitting: Physician Assistant

## 2017-04-08 ENCOUNTER — Telehealth: Payer: Self-pay | Admitting: *Deleted

## 2017-04-08 VITALS — BP 122/74 | HR 80 | Temp 98.0°F | Resp 14 | Ht 61.0 in | Wt 185.0 lb

## 2017-04-08 DIAGNOSIS — L719 Rosacea, unspecified: Secondary | ICD-10-CM | POA: Diagnosis not present

## 2017-04-08 DIAGNOSIS — K219 Gastro-esophageal reflux disease without esophagitis: Secondary | ICD-10-CM | POA: Diagnosis not present

## 2017-04-08 DIAGNOSIS — B9689 Other specified bacterial agents as the cause of diseases classified elsewhere: Secondary | ICD-10-CM

## 2017-04-08 DIAGNOSIS — E119 Type 2 diabetes mellitus without complications: Secondary | ICD-10-CM

## 2017-04-08 DIAGNOSIS — E559 Vitamin D deficiency, unspecified: Secondary | ICD-10-CM | POA: Diagnosis not present

## 2017-04-08 DIAGNOSIS — I10 Essential (primary) hypertension: Secondary | ICD-10-CM | POA: Diagnosis not present

## 2017-04-08 DIAGNOSIS — J988 Other specified respiratory disorders: Secondary | ICD-10-CM

## 2017-04-08 DIAGNOSIS — E785 Hyperlipidemia, unspecified: Secondary | ICD-10-CM | POA: Diagnosis not present

## 2017-04-08 MED ORDER — AZITHROMYCIN 250 MG PO TABS: ORAL_TABLET | ORAL | 0 refills | Status: DC

## 2017-04-08 NOTE — Progress Notes (Addendum)
Patient ID: SABRIE MORITZ MRN: [id number], DOB: 01-Mar-1951, 66 y.o. Date of Encounter: @DATE @  Chief Complaint:  Chief Complaint  Patient presents with  . Follow-up    is fasting    HPI: 66 y.o. year old white female  presents for routine followup office visit.  Diabetes: In past, I discussed that her  A1c was down and that I was concerned about possible hypoglycemia. However she stated that she is getting no low blood sugar readings. She checks her blood sugar fasting once a week. Always gets between 95-105. Never gets less than 80.  is taking all medications as directed including her Actos 30 mg which was most recently added. Still states this is the case.  In the past she was walking 2.5 miles twice a day. However at her visit in December 2014 she reported that the friend who had been walking with her in the evening had stopped.However they were still walking in the morning. Also, in really cold weather in January and February she did not walk on the days when it was really cold weather with ice or snow.  At [place] 3/16 says she has been walking every morning except weekends.  Just joined a gym. Plans to do treadmill and use trainer there--when she tried to do exercises herself, she had major problem b/c of discs in her back.  Feels the trainer can help her do some exercises safely.  At [place] 03/2015--says that this did not work out. That even with the trainers help her back got aggravated with everything she tried to do at the gym. Says that she is walking outside and is fine with just doing that. Says that in the past when she had back surgery she was told that she could not work out at a gym but she thought that sensitive been so many years ago that she would be able to do it at this point that that was not the case.  At [place] 03/2015--says that she is gaining weight all of a sudden and does not know why.--- Still doing her walking for exercise and careful with her diet.  At [place] 10/07/15--She has  gotten a piece of exercise equipment that she is using now. Says that she has to move her arms and her legs. She does 15 minutes twice a day 5 days a week. At [place] 04/08/2016---still exercising routinely. She is very careful with her diet and eats low carbohydrate diet.   10/07/2016: Since her last routine visit with me, she had an extra visit with me on 09/21/16. At that time she reported that she had some episodes of "just not feeling right". At the time of the episodes she had checked her blood sugar and that was normal. She then checked her blood pressure with that first episode and got a very high reading of 180/105. She checked her blood pressure at other times and got 148/88 and the other readings were around 140/80. So, at that visit I added Norvasc 5 mg daily. Today she reports that she is taking the Norvasc 5 mg and this is causing no adverse effects. She states that she has had a couple more episodes of "just not feeling right" and actually thinks that it may be episodes of anxiety. Says that when it occurs she feels anxious and uptight and tension headache. Says that she had an old bottle of Valium and took half of a Valium (thinks the pill was either 2mg  or 5mg  dose and she  took 1/2) the last time she felt the symptoms and the symptoms resolved. No other concerns to address today. Says that there is no specific increased stress going on in her life right now. She is not having generalized anxiety. Does not feel like she has having uncontrollable mind racing. Says that she has had some trouble sleeping for years but this has not worsened or changed.  Diabetes: She continues to take her diabetic medicines as directed. No adverse effects. She continues to exercise on a routine basis Hypertension: Taking medications as directed with no adverse effects. No lower extremity edema. No lightheadedness.   Hyperlipidemia: in past, lipid panel showed her LDL  very low so I had her decrease her simvastatin and  she is down to 20 mg as directed. No myalgias or other adverse effects.  Even with exertion she is having no  angina symptoms.   At [place] 04/08/2016, wants to check Hep C screen--no other complaints/concerns.   04/08/2017: Diabetes: She continues to take her diabetic medicines as directed. No adverse effects.  At [place] 04/08/2017---Today she brings report from recent eye exam--Performed 03/12/2017---"No Diabetic Retinopathy" She continues to provide good footcare.  Hypertension: Taking medications as directed with no adverse effects. No lower extremity edema. No lightheadedness.   Hyperlipidemia: in past, lipid panel showed her LDL  very low so I had her decrease her simvastatin and she is down to 20 mg as directed. No myalgias or other adverse effects.  Even with exertion she is having no  angina symptoms.   She reports that her mom passed away in 20-Feb-2023. Says that she was 66 years old but still it was unexpected. Says that she loved to can foods etc. and had just been doing that earlier that week. Says that she fell and ended up with a subdural hematoma. Says that in August she and her husband traveled with another couple on their RV to [place]. They were gone 3 weeks. Says that she is afraid her A1c will be up. Says that they did some cooking them sounds and would eat out some. Says that for the most part the other couple was very aware of her diabetes and she was able to make pretty good food choices except on the way to [place] and on the way back they did eat quite a bit of fast food some of those days. Also she reports that she has been having a cough and snotty nose for over a week now. Says that she thinks that last week she developed fevers and chills some but did not actually check her temperature. Says that she is blowing out and coughing up thick green mucus/phlegm.  Reports that she has not gotten back into her daily routine of walking. They were gone on that trip and then since she has  returned she has been taking care of things regarding her mother's death. That they did quite a bit of walking on their trip that she has not gotten back into that routine on a daily basis.  No other concerns to address today.    Past Medical History:  Diagnosis Date  . Complication of anesthesia   . DDD (degenerative disc disease)   . Diabetes mellitus   . Elevated liver function tests    eval by dr Leonides Grills to fatty liver 2008  . GERD (gastroesophageal reflux disease)   . Hiatal hernia   . Hiatal hernia   . Hyperlipidemia   . Hypertension   . NASH (nonalcoholic steatohepatitis)   .  Nephrolithiasis   . PONV (postoperative nausea and vomiting)    heel surgery  . Pulmonary nodule    no change-1995  . Rosacea   . Ulcer    gastric ulcer, antritis-egd 09/06/08  . Vitamin D deficiency      Home Meds: Outpatient Medications Prior to Visit  Medication Sig Dispense Refill  . amLODipine (NORVASC) 5 MG tablet Take 1 tablet (5 mg total) by mouth daily. 90 tablet 2  . Cholecalciferol (VITAMIN D) 2000 units tablet Take 1 tablet (2,000 Units total) by mouth daily. 90 tablet 2  . diazepam (VALIUM) 2 MG tablet Take 1 tablet (2 mg total) by mouth every 6 (six) hours as needed for anxiety. 30 tablet 0  . metFORMIN (GLUCOPHAGE) 1000 MG tablet Take 1 tablet (1,000 mg total) by mouth 2 (two) times daily with a meal. 180 tablet 2  . metoprolol succinate (TOPROL-XL) 25 MG 24 hr tablet TAKE 1 TABLET (25 MG TOTAL) BY MOUTH DAILY. 90 tablet 2  . metroNIDAZOLE (METROGEL) 0.75 % gel Apply 1 application topically 2 (two) times daily. 45 g 5  . omeprazole (PRILOSEC) 20 MG capsule Take 1 capsule (20 mg total) by mouth daily. 90 capsule 2  . pioglitazone (ACTOS) 30 MG tablet Take 1 tablet (30 mg total) by mouth daily. 90 tablet 2  . ramipril (ALTACE) 10 MG capsule TAKE ONE CAPSULE BY MOUTH ONE TIME DAILY IN THE P.M. 90 capsule 2  . simvastatin (ZOCOR) 20 MG tablet Take 1 tablet (20 mg total) by mouth  at bedtime. 90 tablet 2   No facility-administered medications prior to visit.      Allergies:  Allergies  Allergen Reactions  . Codeine Nausea And Vomiting  . Penicillins Rash    Social History   Social History  . Marital status: Married    Spouse name: N/A  . Number of children: 2  . Years of education: N/A   Occupational History  . homemaker    Social History Main Topics  . Smoking status: Former Smoker    Types: Cigarettes    Quit date: 07/06/1978  . Smokeless tobacco: Never Used     Comment: intermittent for a few months  . Alcohol use Yes     Comment: occasional mixed drink couple per mo  . Drug use: No  . Sexual activity: Not on file   Other Topics Concern  . Not on file   Social History Narrative   Lives w/ husband    Family History  Problem Relation Age of Onset  . Hyperlipidemia Mother   . COPD Father   . Heart disease Neg Hx   . Colon cancer Maternal Aunt   . Colon cancer Maternal Grandfather      Review of Systems:  See HPI for pertinent ROS. All other ROS negative.    Physical Exam: Blood pressure 122/74, pulse 80, temperature 98 F (36.7 C), temperature source Oral, resp. rate 14, height 5\' 1"  (1.549 m), weight 83.9 kg (185 lb), SpO2 99 %., There is no height or weight on file to calculate BMI. General: WNWD WF. Appears in no acute distress. Neck: Supple. No thyromegaly. No lymphadenopathy. No carotid bruits  Lungs: Clear bilaterally to auscultation without wheezes, rales, or rhonchi. Breathing is unlabored. Heart: RRR with S1 S2. No murmurs, rubs, or gallops. Abdomen: Soft, non-tender, non-distended with normoactive bowel sounds. No hepatomegaly. No rebound/guarding. No obvious abdominal masses. Musculoskeletal:  Strength and tone normal for age. Extremities/Skin: Warm and dry.  No  edema. No rashes or suspicious lesions. Neuro: Alert and oriented X 3. Moves all extremities spontaneously. Gait is normal. CNII-XII grossly in tact. Psych:   Responds to questions appropriately with a normal affect. Diabetic foot exam: Normal inspection. No skin breakdown. No callus. Normal sensation to light touch with monofilament. 2+ bilateral dorsalis pedis pulses and 2+ bilateral posterior tibial pulses.      ASSESSMENT AND PLAN:  66 y.o. year old female with   Bacterial respiratory infection 04/08/2017: She is to take antibiotic as directed. Follow-up if symptoms do not resolve within 1 week after completion of antibiotic. - azithromycin (ZITHROMAX) 250 MG tablet; Day 1: Take 2 daily. Days 2 -5: Take 1 daily.  Dispense: 6 tablet; Refill: 0   Panic anxiety syndrome 04/08/17 at prior visit I had prescribed her some Valium to have available to use as needed. Visit today she reports that she has felt a little sad regarding her mom's death but has not had to use any of the Valium. However does feel good to know that she has some available if needed. States that she does not feel that she needs any other medication--does not need antidepressant medication.   Type 2 diabetes mellitus with hyperglycemia, without long-term current use of insulin ([facility])  At [place] 04/08/2017---Today she brings report from recent eye exam--Performed 03/12/2017---"No Diabetic Retinopathy" Foot Exam is normal.  Kidney function and microalbumin are completely normal.  She is on ACE inhibitor.  She is on statin.  NO ASA SECONDARY TO H/O PUD Microalbumin was performed 03/2014, 03/25/2015,04/08/2016 -- normal.   04/08/2017---check A1C, CMET, MicroAlbumin to monitor  HTN (hypertension) 04/08/2017: Blood pressure is at goal. On ACE inhibitor. Check lab to monitor.   HLD (hyperlipidemia) 04/08/2017: On simvastatin 20 mg. Check lab to monitor  Unspecified vitamin D deficiency 04/08/2017:On 2000 units daily.   Cont current dose.      Recheck vitamin D level 03/25/2015---level was good--41---will wait to recheck---last 2 levels were 53 ---> 41   GERD (gastroesophageal reflux  disease) 04/08/2017: Controlled with current medication.  Screening colonoscopy  04/08/2017:  --Had Fall 2013--normal--repeat 10 years  Rosacea 04/08/2017:  This is controlled with MetroGel. Refill sent 03/25/15.  Pelvic exam, Pap smear  04/08/2017: --Per GYN --Dr[facility]Anderson at [facility]---04/08/2017---we are sending them request to send mammo and DEXA reports to [place]  mammogram : 04/08/2017: --Per GYN office --Dr.Anderson at [facility]---04/08/2017---we are sending them request to send mammo and DEXA reports to [place]  DEXA:  04/08/2017:----These are ordered and managed by GYN --Dr.Anderson at [facility]---04/08/2017---we are sending them request to send mammo and DEXA reports to [place] Addendum Added 05/17/2017: DEXA scan performed on 05/14/17.  This was ordered by Dr. Ouida Sills with OB/GYN.  Report is in epic.  EGD/colonoscopy: 04/08/2017:--- Had these in the fall of 2013.  Immunizations: Tetanus:--  Given here 09/2014 Pneumovax: She defers  Zostavax: Given here 03/25/2015 Influenza: Given here 04/08/2016--04/08/2017----she is to take antibiotics then get flu vaccine once this respiratory infection resolves.  If today's labs remain stable and her blood sugar readings at home remains stable she can wait 6 months for follow-up visit. In her if needed.   Signed, [address] [place], [place], [facility] 04/08/2017 8:15 AM

## 2017-04-08 NOTE — Telephone Encounter (Signed)
Received call from pharmacy.   Reports that severe interaction between Z-Pack and Zocor noted. Reports medications can increase efficacy and increase risk for Rhabdomyolysis.   Advised to stop statin therapy while on ABTx. Resume after completion.

## 2017-04-08 NOTE — Telephone Encounter (Signed)
Agree. Approved. 

## 2017-04-09 ENCOUNTER — Encounter: Payer: Self-pay | Admitting: *Deleted

## 2017-04-09 LAB — MICROALBUMIN, URINE: Microalb, Ur: 1.2 mg/dL

## 2017-04-09 LAB — COMPLETE METABOLIC PANEL WITH GFR
AG RATIO: 1.4 (calc) (ref 1.0–2.5)
ALBUMIN MSPROF: 4.1 g/dL (ref 3.6–5.1)
ALT: 12 U/L (ref 6–29)
AST: 16 U/L (ref 10–35)
Alkaline phosphatase (APISO): 80 U/L (ref 33–130)
BILIRUBIN TOTAL: 0.4 mg/dL (ref 0.2–1.2)
BUN: 16 mg/dL (ref 7–25)
CALCIUM: 9.7 mg/dL (ref 8.6–10.4)
CHLORIDE: 104 mmol/L (ref 98–110)
CO2: 28 mmol/L (ref 20–32)
Creat: 0.95 mg/dL (ref 0.50–0.99)
GFR, EST AFRICAN AMERICAN: 72 mL/min/{1.73_m2} (ref >=60)
GFR, EST NON AFRICAN AMERICAN: 62 mL/min/{1.73_m2} (ref >=60)
Globulin: 2.9 g/dL (calc) (ref 1.9–3.7)
Glucose, Bld: 119 mg/dL — ABNORMAL HIGH (ref 65–99)
POTASSIUM: 4.6 mmol/L (ref 3.5–5.3)
Sodium: 140 mmol/L (ref 135–146)
TOTAL PROTEIN: 7 g/dL (ref 6.1–8.1)

## 2017-04-09 LAB — LIPID PANEL
Cholesterol: 163 mg/dL (ref <200)
HDL: 45 mg/dL — ABNORMAL LOW (ref >50)
LDL CHOLESTEROL (CALC): 88 mg/dL
NON-HDL CHOLESTEROL (CALC): 118 mg/dL (ref <130)
TRIGLYCERIDES: 200 mg/dL — AB (ref <150)
Total CHOL/HDL Ratio: 3.6 (calc) (ref <5.0)

## 2017-04-09 LAB — HEMOGLOBIN A1C
EAG (MMOL/L): 7.3 (calc)
Hgb A1c MFr Bld: 6.2 % of total Hgb — ABNORMAL HIGH (ref <5.7)
Mean Plasma Glucose: 131 (calc)

## 2017-04-12 ENCOUNTER — Encounter: Payer: Self-pay | Admitting: *Deleted

## 2017-04-23 ENCOUNTER — Other Ambulatory Visit: Payer: Self-pay | Admitting: Obstetrics and Gynecology

## 2017-04-23 DIAGNOSIS — E2839 Other primary ovarian failure: Secondary | ICD-10-CM

## 2017-04-26 ENCOUNTER — Other Ambulatory Visit: Payer: Self-pay

## 2017-04-26 MED ORDER — RAMIPRIL 10 MG PO CAPS: ORAL_CAPSULE | ORAL | 2 refills | Status: DC

## 2017-04-28 ENCOUNTER — Other Ambulatory Visit: Payer: Self-pay

## 2017-04-28 DIAGNOSIS — Z1231 Encounter for screening mammogram for malignant neoplasm of breast: Secondary | ICD-10-CM | POA: Diagnosis not present

## 2017-04-28 LAB — HM MAMMOGRAPHY

## 2017-05-14 ENCOUNTER — Ambulatory Visit
Admission: RE | Admit: 2017-05-14 | Discharge: 2017-05-14 | Disposition: A | Discharge status: Home / Self Care | Payer: PRIVATE HEALTH INSURANCE | Source: Ambulatory Visit | Attending: Obstetrics and Gynecology | Admitting: Obstetrics and Gynecology

## 2017-05-14 ENCOUNTER — Other Ambulatory Visit: Payer: Self-pay

## 2017-05-14 DIAGNOSIS — Z780 Asymptomatic menopausal state: Secondary | ICD-10-CM | POA: Diagnosis not present

## 2017-05-14 DIAGNOSIS — M8589 Other specified disorders of bone density and structure, multiple sites: Secondary | ICD-10-CM | POA: Diagnosis not present

## 2017-05-14 DIAGNOSIS — E2839 Other primary ovarian failure: Secondary | ICD-10-CM

## 2017-05-14 MED ORDER — SIMVASTATIN 20 MG PO TABS: 20.0000 mg | ORAL_TABLET | Freq: Every day | ORAL | 2 refills | Status: DC

## 2017-05-14 NOTE — Telephone Encounter (Signed)
Refill appropriate 

## 2017-05-24 ENCOUNTER — Ambulatory Visit (INDEPENDENT_AMBULATORY_CARE_PROVIDER_SITE_OTHER): Payer: Medicare Other | Admitting: Physician Assistant

## 2017-05-24 ENCOUNTER — Encounter: Payer: Self-pay | Admitting: Physician Assistant

## 2017-05-24 ENCOUNTER — Other Ambulatory Visit: Payer: Self-pay

## 2017-05-24 DIAGNOSIS — M8580 Other specified disorders of bone density and structure, unspecified site: Secondary | ICD-10-CM | POA: Diagnosis not present

## 2017-05-24 MED ORDER — CALCIUM GLUCONATE 500 MG PO TABS: 1.0000 | ORAL_TABLET | Freq: Two times a day (BID) | ORAL | 11 refills | Status: DC

## 2017-05-24 NOTE — Progress Notes (Signed)
Patient ID: SHATHA HOOSER MRN: [id number], DOB: Sep 14, 1950, 66 y.o. Date of Encounter: 05/24/2017, 12:59 PM    Chief Complaint:  Chief Complaint  Patient presents with  . discuss bone density scan     HPI: 66 y.o. year old female presents with above.   Her gynecologist ordered a bone density scan that patient had recently.   However, once they got the results, they recommended she come here to discuss results and treatment.  This was her first bone density scan that she had performed. Worst T score was -2.2. Other T score was -1.6.  Consistant with osteopenia.  She is currently taking vitamin D 2000 units daily. Currently she is not on calcium.  Currently-- for exercise-- she is doing:  walking.  Patient has had a fall --which resulted in--- a fracture to her elbow.  No other history of fracture. Patient does report that her mom had osteoporosis and was " hunched over ".  She does not use steroids.  No chronic medical problems that require steroid use. She does not smoke and has not smoked any significant amount in the past. She is not thin/frail/underweight.     Home Meds:   Outpatient Medications Prior to Visit  Medication Sig Dispense Refill  . amLODipine (NORVASC) 5 MG tablet Take 1 tablet (5 mg total) by mouth daily. 90 tablet 2  . Cholecalciferol (VITAMIN D) 2000 units tablet Take 1 tablet (2,000 Units total) by mouth daily. 90 tablet 2  . diazepam (VALIUM) 2 MG tablet Take 1 tablet (2 mg total) by mouth every 6 (six) hours as needed for anxiety. 30 tablet 0  . metFORMIN (GLUCOPHAGE) 1000 MG tablet Take 1 tablet (1,000 mg total) by mouth 2 (two) times daily with a meal. 180 tablet 2  . metoprolol succinate (TOPROL-XL) 25 MG 24 hr tablet TAKE 1 TABLET (25 MG TOTAL) BY MOUTH DAILY. 90 tablet 2  . metroNIDAZOLE (METROGEL) 0.75 % gel Apply 1 application topically 2 (two) times daily. 45 g 5  . omeprazole (PRILOSEC) 20 MG capsule Take 1 capsule (20 mg total) by  mouth daily. 90 capsule 2  . pioglitazone (ACTOS) 30 MG tablet Take 1 tablet (30 mg total) by mouth daily. 90 tablet 2  . ramipril (ALTACE) 10 MG capsule TAKE ONE CAPSULE BY MOUTH ONE TIME DAILY IN THE P.M. 90 capsule 2  . simvastatin (ZOCOR) 20 MG tablet Take 1 tablet (20 mg total) at bedtime by mouth. 90 tablet 2  . azithromycin (ZITHROMAX) 250 MG tablet Day 1: Take 2 daily. Days 2 -5: Take 1 daily. 6 tablet 0   No facility-administered medications prior to visit.     Allergies:  Allergies  Allergen Reactions  . Codeine Nausea And Vomiting  . Penicillins Rash      Review of Systems: See HPI for pertinent ROS. All other ROS negative.    Physical Exam: Blood pressure 118/68, pulse 82, temperature 97.9 F (36.6 C), temperature source Oral, resp. rate 14, weight 84.3 kg (185 lb 12.8 oz), SpO2 97 %., Body mass index is 35.11 kg/m. General:  WF. Appears in no acute distress. Neck: Supple. No thyromegaly. No lymphadenopathy. Lungs: Clear bilaterally to auscultation without wheezes, rales, or rhonchi. Breathing is unlabored. Heart: Regular rhythm. No murmurs, rubs, or gallops. Msk:  Strength and tone normal for age. Extremities/Skin: Warm and dry.  Neuro: Alert and oriented X 3. Moves all extremities spontaneously. Gait is normal. CNII-XII grossly in tact. Psych:  Responds to questions  appropriately with a normal affect.     ASSESSMENT AND PLAN:  66 y.o. year old female with  1. Osteopenia, unspecified location Discussed results.  Discussed that -2.5 is the cutoff for "osteoporosis" Discussed treating with prescription medication such as Fosamax once a week. She states that she does not want to add prescription medication unless absolutely necessary. She will continue the current dose of vitamin D.   She will add calcium approximate 1000 units daily. Also discussed adding weightbearing exercise in addition to her walking. Also discussed need to get follow-up DEXA scan in 2 years  to monitor. - calcium gluconate 500 MG tablet; Take 1 tablet (500 mg total) 2 (two) times daily by mouth.  Dispense: 60 tablet; Refill: [address] [place], [place], [facility] 05/24/2017 12:59 PM

## 2017-06-10 ENCOUNTER — Other Ambulatory Visit: Payer: Self-pay

## 2017-06-10 DIAGNOSIS — I10 Essential (primary) hypertension: Secondary | ICD-10-CM

## 2017-06-10 DIAGNOSIS — K219 Gastro-esophageal reflux disease without esophagitis: Secondary | ICD-10-CM

## 2017-06-10 MED ORDER — PIOGLITAZONE HCL 30 MG PO TABS: 30.0000 mg | ORAL_TABLET | Freq: Every day | ORAL | 0 refills | Status: DC

## 2017-06-10 MED ORDER — SIMVASTATIN 20 MG PO TABS: 20.0000 mg | ORAL_TABLET | Freq: Every day | ORAL | 0 refills | Status: DC

## 2017-06-10 MED ORDER — AMLODIPINE BESYLATE 5 MG PO TABS: 5.0000 mg | ORAL_TABLET | Freq: Every day | ORAL | 0 refills | Status: DC

## 2017-06-10 MED ORDER — METFORMIN HCL 1000 MG PO TABS: 1000.0000 mg | ORAL_TABLET | Freq: Two times a day (BID) | ORAL | 0 refills | Status: DC

## 2017-06-10 MED ORDER — RAMIPRIL 10 MG PO CAPS: ORAL_CAPSULE | ORAL | 0 refills | Status: DC

## 2017-06-10 MED ORDER — METOPROLOL SUCCINATE ER 25 MG PO TB24: ORAL_TABLET | ORAL | 0 refills | Status: DC

## 2017-06-10 MED ORDER — OMEPRAZOLE 20 MG PO CPDR: 20.0000 mg | DELAYED_RELEASE_CAPSULE | Freq: Every day | ORAL | 2 refills | Status: DC

## 2017-06-10 NOTE — Telephone Encounter (Signed)
Patient had pharmacy change to mail order refills appropriate

## 2017-07-14 ENCOUNTER — Ambulatory Visit (INDEPENDENT_AMBULATORY_CARE_PROVIDER_SITE_OTHER): Payer: Medicare Other | Admitting: Physician Assistant

## 2017-07-14 ENCOUNTER — Encounter: Payer: Self-pay | Admitting: Physician Assistant

## 2017-07-14 VITALS — BP 118/72 | HR 76 | Temp 97.8°F | Resp 14 | Ht 61.0 in | Wt 186.8 lb

## 2017-07-14 DIAGNOSIS — E559 Vitamin D deficiency, unspecified: Secondary | ICD-10-CM

## 2017-07-14 DIAGNOSIS — M8580 Other specified disorders of bone density and structure, unspecified site: Secondary | ICD-10-CM | POA: Diagnosis not present

## 2017-07-14 DIAGNOSIS — K219 Gastro-esophageal reflux disease without esophagitis: Secondary | ICD-10-CM

## 2017-07-14 DIAGNOSIS — G4700 Insomnia, unspecified: Secondary | ICD-10-CM | POA: Diagnosis not present

## 2017-07-14 DIAGNOSIS — I10 Essential (primary) hypertension: Secondary | ICD-10-CM

## 2017-07-14 DIAGNOSIS — E785 Hyperlipidemia, unspecified: Secondary | ICD-10-CM | POA: Diagnosis not present

## 2017-07-14 DIAGNOSIS — E119 Type 2 diabetes mellitus without complications: Secondary | ICD-10-CM

## 2017-07-14 NOTE — Progress Notes (Signed)
Patient ID: Denise Meadows MRN: [id number], DOB: [birth year], 67 y.o. Date of Encounter: @DATE @  Chief Complaint:  Chief Complaint  Patient presents with  . 3 month follow up    HPI: 67 y.o. year old white female  presents for routine followup office visit.  Diabetes: In past, I discussed that her  A1c was down and that I was concerned about possible hypoglycemia. However she stated that she is getting no low blood sugar readings. She checks her blood sugar fasting once a week. Always gets between 95-105. Never gets less than 80.  is taking all medications as directed including her Actos 30 mg which was most recently added. Still states this is the case.  In the past she was walking 2.5 miles twice a day. However at her visit in December 2014 she reported that the friend who had been walking with her in the evening had stopped.However they were still walking in the morning. Also, in really cold weather in January and February she did not walk on the days when it was really cold weather with ice or snow.  At [place] 3/16 says she has been walking every morning except weekends.  Just joined a gym. Plans to do treadmill and use trainer there--when she tried to do exercises herself, she had major problem b/c of discs in her back.  Feels the trainer can help her do some exercises safely.  At [place] 03/2015--says that this did not work out. That even with the trainers help her back got aggravated with everything she tried to do at the gym. Says that she is walking outside and is fine with just doing that. Says that in the past when she had back surgery she was told that she could not work out at a gym but she thought that sensitive been so many years ago that she would be able to do it at this point that that was not the case.  At [place] 03/2015--says that she is gaining weight all of a sudden and does not know why.--- Still doing her walking for exercise and careful with her diet.  At [place] 10/07/15--She has gotten  a piece of exercise equipment that she is using now. Says that she has to move her arms and her legs. She does 15 minutes twice a day 5 days a week. At [place] 04/08/2016---still exercising routinely. She is very careful with her diet and eats low carbohydrate diet.   10/07/2016: Since her last routine visit with me, she had an extra visit with me on 09/21/16. At that time she reported that she had some episodes of "just not feeling right". At the time of the episodes she had checked her blood sugar and that was normal. She then checked her blood pressure with that first episode and got a very high reading of 180/105. She checked her blood pressure at other times and got 148/88 and the other readings were around 140/80. So, at that visit I added Norvasc 5 mg daily. Today she reports that she is taking the Norvasc 5 mg and this is causing no adverse effects. She states that she has had a couple more episodes of "just not feeling right" and actually thinks that it may be episodes of anxiety. Says that when it occurs she feels anxious and uptight and tension headache. Says that she had an old bottle of Valium and took half of a Valium (thinks the pill was either 2mg  or 5mg  dose and she took 1/2)  the last time she felt the symptoms and the symptoms resolved. No other concerns to address today. Says that there is no specific increased stress going on in her life right now. She is not having generalized anxiety. Does not feel like she has having uncontrollable mind racing. Says that she has had some trouble sleeping for years but this has not worsened or changed.  Diabetes: She continues to take her diabetic medicines as directed. No adverse effects. She continues to exercise on a routine basis Hypertension: Taking medications as directed with no adverse effects. No lower extremity edema. No lightheadedness.   Hyperlipidemia: in past, lipid panel showed her LDL  very low so I had her decrease her simvastatin and she is  down to 20 mg as directed. No myalgias or other adverse effects.  Even with exertion she is having no  angina symptoms.   At [place] 04/08/2016, wants to check Hep C screen--no other complaints/concerns.   04/08/2017: Diabetes: She continues to take her diabetic medicines as directed. No adverse effects.  At [place] 04/08/2017---Today she brings report from recent eye exam--Performed 03/12/2017---"No Diabetic Retinopathy" She continues to provide good footcare.  Hypertension: Taking medications as directed with no adverse effects. No lower extremity edema. No lightheadedness.   Hyperlipidemia: in past, lipid panel showed her LDL  very low so I had her decrease her simvastatin and she is down to 20 mg as directed. No myalgias or other adverse effects.  Even with exertion she is having no  angina symptoms.   She reports that her mom passed away in 02-13-2023. Says that she was 67 years old but still it was unexpected. Says that she loved to can foods etc. and had just been doing that earlier that week. Says that she fell and ended up with a subdural hematoma. Says that in August she and her husband traveled with another couple on their RV to [place]. They were gone 3 weeks. Says that she is afraid her A1c will be up. Says that they did some cooking them sounds and would eat out some. Says that for the most part the other couple was very aware of her diabetes and she was able to make pretty good food choices except on the way to [place] and on the way back they did eat quite a bit of fast food some of those days. Also she reports that she has been having a cough and snotty nose for over a week now. Says that she thinks that last week she developed fevers and chills some but did not actually check her temperature. Says that she is blowing out and coughing up thick green mucus/phlegm.  Reports that she has not gotten back into her daily routine of walking. They were gone on that trip and then since she has returned she  has been taking care of things regarding her mother's death. That they did quite a bit of walking on their trip that she has not gotten back into that routine on a daily basis.  No other concerns to address today.   07/14/2016: Since her last routine office visit with me, she did have an additional visit with me on 05/24/2017 to discuss osteopenia.   Her gynecologist had ordered a bone density scan and recommend that she come here to discuss results and treatment plan.   This was her first bone density scan performed.  Worst T score was -2.2.   At that time she was currently taking vitamin D 2000 units daily  but had not been taking calcium.   At that visit I recommend that she add calcium and today she reports that she is taking the calcium. She is continuing to be very active with exercise.   At that visit, discussed her risk factors for osteporosis.  She had history of a fall which resulted in a fracture to her elbow.  Patient had no other history of fracture.  Did report that her mom had osteoporosis and "was hunched over ". She has not been using steroids.  She has no chronic medical problems that require frequent steroid use.  She does not smoke and has not smoked any significant amount in the past.  Is not thin/frail/underweight.  At that time discussed prescription medication.   She stated that she did not want to add prescription medication unless absolutely necessary.   Planned to continue vitamin D and she would add calcium 1000 units daily.  Discussed weightbearing exercise.  Discussed getting follow-up DEXA scan in 2 years to monitor.  She has no specific concerns to address today. Asked how her holidays have been especially since it was the first Christmas without her mother.  She says that she "made it through".  Says that then she took a trip to [place] to [place] for the [employer].  Says that she had a good time.  Says that she has been doing a lot of walking and is trying  to be compliant with her diet.  Diabetes: She continues to take her diabetic medicines as directed. No adverse effects.  At [place] 04/08/2017---Today she brings report from recent eye exam--Performed 03/12/2017---"No Diabetic Retinopathy" She continues to provide good footcare.  Hypertension: Taking medications as directed with no adverse effects. No lower extremity edema. No lightheadedness.   Hyperlipidemia: in past, lipid panel showed her LDL  very low so I had her decrease her simvastatin and she is down to 20 mg as directed. No myalgias or other adverse effects.  Even with exertion she is having no  angina symptoms.     Past Medical History:  Diagnosis Date  . Complication of anesthesia   . DDD (degenerative disc disease)   . Diabetes mellitus   . Elevated liver function tests    eval by dr Leonides Grills to fatty liver 2008  . GERD (gastroesophageal reflux disease)   . Hiatal hernia   . Hiatal hernia   . Hyperlipidemia   . Hypertension   . NASH (nonalcoholic steatohepatitis)   . Nephrolithiasis   . PONV (postoperative nausea and vomiting)    heel surgery  . Pulmonary nodule    no change-1995  . Rosacea   . Ulcer    gastric ulcer, antritis-egd 09/06/08  . Vitamin D deficiency      Home Meds: Outpatient Medications Prior to Visit  Medication Sig Dispense Refill  . amLODipine (NORVASC) 5 MG tablet Take 1 tablet (5 mg total) by mouth daily. 90 tablet 0  . calcium gluconate 500 MG tablet Take 1 tablet (500 mg total) 2 (two) times daily by mouth. 60 tablet 11  . Cholecalciferol (VITAMIN D) 2000 units tablet Take 1 tablet (2,000 Units total) by mouth daily. 90 tablet 2  . diazepam (VALIUM) 2 MG tablet Take 1 tablet (2 mg total) by mouth every 6 (six) hours as needed for anxiety. 30 tablet 0  . metFORMIN (GLUCOPHAGE) 1000 MG tablet Take 1 tablet (1,000 mg total) by mouth 2 (two) times daily with a meal. 180 tablet 0  . metoprolol  succinate (TOPROL-XL) 25 MG 24 hr tablet TAKE 1  TABLET (25 MG TOTAL) BY MOUTH DAILY. 90 tablet 0  . metroNIDAZOLE (METROGEL) 0.75 % gel Apply 1 application topically 2 (two) times daily. 45 g 5  . omeprazole (PRILOSEC) 20 MG capsule Take 1 capsule (20 mg total) by mouth daily. 90 capsule 2  . pioglitazone (ACTOS) 30 MG tablet Take 1 tablet (30 mg total) by mouth daily. 90 tablet 0  . ramipril (ALTACE) 10 MG capsule TAKE ONE CAPSULE BY MOUTH ONE TIME DAILY IN THE P.M. 90 capsule 0  . simvastatin (ZOCOR) 20 MG tablet Take 1 tablet (20 mg total) by mouth at bedtime. 90 tablet 0   No facility-administered medications prior to visit.      Allergies:  Allergies  Allergen Reactions  . Codeine Nausea And Vomiting  . Penicillins Rash    Social History   Socioeconomic History  . Marital status: Married    Spouse name: Not on file  . Number of children: 2  . Years of education: Not on file  . Highest education level: Not on file  Social Needs  . Financial resource strain: Not on file  . Food insecurity - worry: Not on file  . Food insecurity - inability: Not on file  . Transportation needs - medical: Not on file  . Transportation needs - non-medical: Not on file  Occupational History  . Occupation: homemaker  Tobacco Use  . Smoking status: Former Smoker    Types: Cigarettes    Last attempt to quit: 07/06/1978    Years since quitting: 39.0  . Smokeless tobacco: Never Used  . Tobacco comment: intermittent for a few months  Substance and Sexual Activity  . Alcohol use: Yes    Comment: occasional mixed drink couple per mo  . Drug use: No  . Sexual activity: Not on file  Other Topics Concern  . Not on file  Social History Narrative   Lives w/ husband    Family History  Problem Relation Age of Onset  . Hyperlipidemia Mother   . COPD Father   . Heart disease Neg Hx   . Colon cancer Maternal Aunt   . Colon cancer Maternal Grandfather      Review of Systems:  See HPI for pertinent ROS. All other ROS negative.    Physical  Exam: Blood pressure 118/72, pulse 76, temperature 97.8 F (36.6 C), temperature source Oral, resp. rate 14, height 5\' 1"  (1.549 m), weight 84.7 kg (186 lb 12.8 oz), SpO2 98 %., Body mass index is 35.3 kg/m. General: WNWD WF. Appears in no acute distress. Neck: Supple. No thyromegaly. No lymphadenopathy. No carotid bruits  Lungs: Clear bilaterally to auscultation without wheezes, rales, or rhonchi. Breathing is unlabored. Heart: RRR with S1 S2. No murmurs, rubs, or gallops. Abdomen: Soft, non-tender, non-distended with normoactive bowel sounds. No hepatomegaly. No rebound/guarding. No obvious abdominal masses. Musculoskeletal:  Strength and tone normal for age. Extremities/Skin: Warm and dry.  No edema. No rashes or suspicious lesions. Neuro: Alert and oriented X 3. Moves all extremities spontaneously. Gait is normal. CNII-XII grossly in tact. Psych:  Responds to questions appropriately with a normal affect. Diabetic foot exam: Normal inspection. No skin breakdown. No callus. Normal sensation to light touch with monofilament. 2+ bilateral dorsalis pedis pulses and 2+ bilateral posterior tibial pulses.      ASSESSMENT AND PLAN:  67 y.o. year old female with   Osteopenia 07/14/2017: She had Office visit with me on 05/24/17 to  discuss DEXA scan. T score -2.5. She did not want to add prescription medication unless absolutely necessary. She is currently taking calcium, vitamin D, and doing exercise. She will need follow-up DEXA scan 2 years to monitor. See HPI from 07/14/17 for details.  Type 2 diabetes mellitus with hyperglycemia, without long-term current use of insulin (HCC)  At [place] 04/08/2017---Today she brings report from recent eye exam--Performed 03/12/2017---"No Diabetic Retinopathy" Foot Exam is normal.  Kidney function and microalbumin are completely normal.  She is on ACE inhibitor.  She is on statin.  NO ASA SECONDARY TO H/O PUD Microalbumin was performed 03/2014,  03/25/2015,04/08/2016 -- normal.   04/08/2017---check A1C, CMET, MicroAlbumin to monitor 07/14/2017--check A1C  HTN (hypertension) 07/14/2017: Blood pressure is at goal. On ACE inhibitor. Check lab to monitor.   HLD (hyperlipidemia) 07/14/2017: On simvastatin 20 mg. Check lab to monitor  Unspecified vitamin D deficiency 07/14/2017:On 2000 units daily.   Cont current dose.      Recheck vitamin D level 03/25/2015---level was good--41---will wait to recheck---last 2 levels were 53 ---> 41   GERD (gastroesophageal reflux disease) 07/14/2017: Controlled with current medication.  Screening colonoscopy  07/14/2017:  --Had Fall 2013--normal--repeat 10 years  Rosacea 07/14/2017:  This is controlled with MetroGel. Refill sent 03/25/15.  Pelvic exam, Pap smear  07/14/2017: --Per GYN --Dr.Anderson at [facility]---04/08/2017---we are sending them request to send mammo and DEXA reports to [place]  mammogram : 07/14/2017: --Per GYN office --Dr[facility]Anderson at [facility]---04/08/2017---we are sending them request to send mammo and DEXA reports to [place]  DEXA:  See above under Osteopenia   EGD/colonoscopy: 07/14/2017:--- Had these in the fall of 2013.  Immunizations: Tetanus:--  Given here 09/2014 Pneumovax: She defers  Zostavax: Given here 03/25/2015 Influenza: Given here 04/08/2016--04/08/2017----she is to take antibiotics then get flu vaccine once this respiratory infection resolves.  If today's labs remain stable and her blood sugar readings at home remains stable she can wait 6 months for follow-up visit. In her if needed.   Signed, [address] [place], [place], [facility] 07/14/2017 8:31 AM

## 2017-07-15 LAB — HEMOGLOBIN A1C
EAG (MMOL/L): 7.4 (calc)
Hgb A1c MFr Bld: 6.3 % of total Hgb — ABNORMAL HIGH (ref <5.7)
Mean Plasma Glucose: 134 (calc)

## 2017-08-03 ENCOUNTER — Other Ambulatory Visit: Payer: Self-pay | Admitting: Physician Assistant

## 2017-08-04 NOTE — Telephone Encounter (Signed)
Refill appropriate 

## 2017-09-21 ENCOUNTER — Other Ambulatory Visit: Payer: Self-pay | Admitting: Physician Assistant

## 2017-09-21 DIAGNOSIS — I10 Essential (primary) hypertension: Secondary | ICD-10-CM

## 2017-10-19 ENCOUNTER — Encounter: Payer: Self-pay | Admitting: Family Medicine

## 2017-10-19 ENCOUNTER — Ambulatory Visit
Admission: RE | Admit: 2017-10-19 | Discharge: 2017-10-19 | Disposition: A | Discharge status: Home / Self Care | Payer: Medicare Other | Source: Ambulatory Visit | Attending: Family Medicine | Admitting: Family Medicine

## 2017-10-19 ENCOUNTER — Other Ambulatory Visit: Payer: Self-pay

## 2017-10-19 ENCOUNTER — Ambulatory Visit (INDEPENDENT_AMBULATORY_CARE_PROVIDER_SITE_OTHER): Payer: Medicare Other | Admitting: Family Medicine

## 2017-10-19 VITALS — BP 118/70 | HR 60 | Temp 97.7°F | Resp 16 | Ht 61.0 in | Wt 188.8 lb

## 2017-10-19 DIAGNOSIS — B372 Candidiasis of skin and nail: Secondary | ICD-10-CM

## 2017-10-19 DIAGNOSIS — S3992XA Unspecified injury of lower back, initial encounter: Secondary | ICD-10-CM | POA: Diagnosis not present

## 2017-10-19 DIAGNOSIS — M25552 Pain in left hip: Secondary | ICD-10-CM | POA: Diagnosis not present

## 2017-10-19 DIAGNOSIS — S79911A Unspecified injury of right hip, initial encounter: Secondary | ICD-10-CM | POA: Diagnosis not present

## 2017-10-19 DIAGNOSIS — M25551 Pain in right hip: Secondary | ICD-10-CM | POA: Diagnosis not present

## 2017-10-19 DIAGNOSIS — S79912A Unspecified injury of left hip, initial encounter: Secondary | ICD-10-CM | POA: Diagnosis not present

## 2017-10-19 MED ORDER — TRAMADOL HCL 50 MG PO TABS: 50.0000 mg | ORAL_TABLET | Freq: Two times a day (BID) | ORAL | 0 refills | Status: AC | PRN

## 2017-10-19 MED ORDER — DONUT PESSARY MISC: 0 refills | Status: DC

## 2017-10-19 MED ORDER — KETOCONAZOLE 2 % EX CREA: 1.0000 "application " | TOPICAL_CREAM | Freq: Two times a day (BID) | CUTANEOUS | 1 refills | Status: AC

## 2017-10-19 NOTE — Patient Instructions (Signed)
Tailbone injury -   Get Xrays done and I will call you with the results. The donut cushion is available at your pharmacy over the counter in two sizes costing under $20 Use pain medication sparingly, tylenol and ibuprofen first, apply ice, rest, and use tramadol for severe pain.   Tailbone Injury The tailbone (coccyx) is the small bone at the lower end of the spine. A tailbone injury may involve stretched ligaments, bruising, or a broken bone (fracture). Tailbone injuries can be painful, and some may take a long time to heal. What are the causes? This condition is often caused by falling and landing on the tailbone. Other causes include:  Repeated strain or friction from actions such as rowing and bicycling.  Childbirth.  In some cases, the cause may not be known. What increases the risk? This condition is more common in women than in men. What are the signs or symptoms? Symptoms of this condition include:  Pain in the lower back, especially when sitting.  Pain or difficulty when standing up from a sitting position.  Bruising in the tailbone area.  Painful bowel movements.  In women, pain during intercourse.  How is this diagnosed? This condition may be diagnosed based on your symptoms and a physical exam. X-rays may be taken if a fracture is suspected. You may also have other tests, such as a CT scan or MRI. How is this treated? This condition may be treated with medicines to help relieve your pain. Most tailbone injuries heal on their own in 4-6 weeks. However, recovery time may be longer if the injury involves a fracture. Follow these instructions at home:  Take medicines only as directed by your health care provider.  If directed, apply ice to the injured area: ? Put ice in a plastic bag. ? Place a towel between your skin and the bag. ? Leave the ice on for 20 minutes, 2-3 times per day for the first 1-2 days.  Sit on a large, rubber or inflated ring or cushion to  ease your pain. Lean forward when you are sitting to help decrease discomfort.  Avoid sitting for long periods of time.  Increase your activity as the pain allows. Perform any exercises that are recommended by your health care provider or physical therapist.  If you have pain during bowel movements, use stool softeners as directed by your health care provider.  Eat a diet that includes plenty of fiber to help prevent constipation.  Keep all follow-up visits as directed by your health care provider. This is important. How is this prevented? Wear appropriate padding and sports gear when bicycling and rowing. This can help to prevent developing an injury that is caused by repeated strain or friction. Contact a health care provider if:  Your pain becomes worse.  Your bowel movements cause a great deal of discomfort.  You are unable to have a bowel movement.  You have uncontrolled urine loss (urinary incontinence).  You have a fever. This information is not intended to replace advice given to you by your health care provider. Make sure you discuss any questions you have with your health care provider. Document Released: 06/19/2000 Document Revised: 02/20/2016 Document Reviewed: 06/18/2014 Elsevier Interactive Patient Education  2018 [employer].     Minimizing moisture and friction in the involved area Apply cream twice a day Get a drying powder, can get antifungal powder (ask pharmacist)     ?Daily cleansing of affected skin with a mild cleanser followed by drying  of affected area with a hair dryer on a cool setting  ?Aeration of affected area when feasible  ?Daily application of drying powders  ?Use of absorbent material or clothing, such as cotton or merino wool, to separate skin in folds  ?Closer management of sugars   Intertrigo Intertrigo is skin irritation or inflammation (dermatitis) that occurs when folds of skin rub together. The irritation can cause a rash and  make skin raw and itchy. This condition most commonly occurs in the skin folds of these areas:  Toes.  Armpits.  Groin.  Belly.  Breasts.  Buttocks.  Intertrigo is not passed from person to person (is not contagious). What are the causes? This condition is caused by heat, moisture, friction, and lack of air circulation. The condition can be made worse by:  Sweat.  Bacteria or a fungus, such as yeast.  What increases the risk? This condition is more likely to occur if you have moisture in your skin folds. It is also more likely to develop in people who:  Have diabetes.  Are overweight.  Are on bed rest.  Live in a warm and moist climate.  Wear splints, braces, or other medical devices.  Are not able to control their bowels or bladder (have incontinence).  What are the signs or symptoms? Symptoms of this condition include:  A pink or red skin rash.  Brown patches on the skin.  Raw or scaly skin.  Itchiness.  A burning feeling.  Bleeding.  Leaking fluid.  A bad smell.  How is this diagnosed? This condition is diagnosed with a medical history and physical exam. You may also have a skin swab to test for bacteria or a fungus, such as yeast. How is this treated? Treatment may include:  Cleaning and drying your skin.  An oral antibiotic medicine or antibiotic skin cream for a bacterial infection.  Antifungal cream or pills for an infection that was caused by a fungus, such as yeast.  Steroid ointment to relieve itchiness and irritation.  Follow these instructions at home:  Keep the affected area clean and dry.  Do not scratch your skin.  Stay in a cool environment as much as possible. Use an air conditioner or fan, if available.  Apply over-the-counter and prescription medicines only as told by your health care provider.  If you were prescribed an antibiotic medicine, use it as told by your health care provider. Do not stop using the antibiotic  even if your condition improves.  Keep all follow-up visits as told by your health care provider. This is important. How is this prevented?  Maintain a healthy weight.  Take care of your feet, especially if you have diabetes. Foot care includes: ? Wearing shoes that fit well. ? Keeping your feet dry. ? Wearing clean, breathable socks.  Protect the skin around your groin and buttocks, especially if you have incontinence. Skin protection includes: ? Following a regular cleaning routine. ? Using moisturizers and skin protectants. ? Changing protection pads frequently.  Do not wear tight clothes. Wear clothes that are loose and absorbent. Wear clothes that are made of cotton.  Wear a bra that gives good support, if needed.  Shower and dry yourself thoroughly after activity. Use a hair dryer on a cool setting to dry between skin folds, especially after you bathe.  If you have diabetes, keep your blood sugar under control. Contact a health care provider if:  Your symptoms do not improve with treatment.  Your symptoms get worse  or they spread.  You notice increased redness and warmth.  You have a fever. This information is not intended to replace advice given to you by your health care provider. Make sure you discuss any questions you have with your health care provider. Document Released: 06/22/2005 Document Revised: 11/28/2015 Document Reviewed: 12/24/2014 Elsevier Interactive Patient Education  2018 [employer].

## 2017-10-19 NOTE — Progress Notes (Signed)
X-rays negative for fracture/broken bones of pelvis, hips, sacrum and coccyx. 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  If she does not improve with supportive tx with donut, ice, rest, we can make a spine referral to address, or we can order MRI to get more detailed info about the spine.

## 2017-10-19 NOTE — Progress Notes (Signed)
Patient ID: Denise Meadows, female    DOB: Jul 14, 1950, 67 y.o.   MRN: [id number]  PCP: Orlena Sheldon, PA-C  Chief Complaint  Patient presents with  . Rash    under left breast   . sat down on piece of wood    hurt gluteal happened 2 weeks ago     Subjective:   Denise Meadows is a 67 y.o. female, presents to clinic with CC of tailbone injury that occurred 2 weeks ago and new rash that she noticed this morning.  Patient was traveling [place] the [place] with her [place] when she first injured 2 weeks ago.  She was staying in a hotel overnight and had gotten up in the middle the night and attempted to sit down on the couch in the hotel.  There is very little light and she was unable to see and she accidentally sat on the couch cushion which was a large piece of wood with thin padding over it.  This struck her directly in the tailbone she had severe instant pain giving her cold chills throughout her body throbbing and sharp.  She had severe pain with long sitting and with getting up and sitting down but she states she was able to move her legs without much pain and she is able to walk without difficulty.  The tailbone pain persisted with a steady throbbing but it did improve for 3 days until she later accidentally sat down on a commode which was much lower than she anticipated causing her to drop and impacted suddenly.  This exacerbated the pain.  She currently has 0 out of 10 pain when she is still in the seated position, however when attempted to get up or sit down pain is a throbbing, located in her low back and central buttocks and tailbone, pain severity 5 out of 10.  No medications or treatments tried prior to arrival.  She states that she did have some intermittent shooting of low back pain rating into the left outer thigh with tingling and numbness, none currently.  She states that she does have a history of low back surgery and sciatica.  She denies any bruising or swelling.  Denies any lower  extremity weakness or numbness.  No falls or difficulty with gait.  She has a history of osteopenia, vitamin D D deficiency, and low back and neck surgery.  Patient also noticed this morning when she was getting out of shower a red rash located under her left breast that has minimal itching and burning.  No rash noted to any other location.  No history of similar rash.  No one else at home with rash.  Patient does have a history of diabetes and states that is well managed she only takes her blood pressure once a week years and a Wednesday morning and her fasting blood sugars range from 110-120.    Patient Active Problem List   Diagnosis Date Noted  . Osteopenia 05/24/2017  . HTN (hypertension) 08/29/2012  . HLD (hyperlipidemia) 08/29/2012  . Vitamin D deficiency 08/29/2012  . Diabetes mellitus type 2, uncomplicated ([facility]) 47/03/6282  . Rosacea 08/29/2012  . H/O nephrolithotomy with removal of calculi 08/29/2012  . GERD (gastroesophageal reflux disease) 01/18/2012  . Dysphagia 01/18/2012  . Encounter for screening colonoscopy 01/18/2012  . Neck mass 10/01/2010  . Insomnia 10/01/2010  . Hiatal hernia      Prior to Admission medications   Medication Sig Start Date End Date Taking? Authorizing  Provider  amLODipine (NORVASC) 5 MG tablet TAKE 1 TABLET BY MOUTH  DAILY 09/21/17  Yes Dena Billet B, PA-C  calcium gluconate 500 MG tablet Take 1 tablet (500 mg total) 2 (two) times daily by mouth. 05/24/17  Yes Dixon, Lonie Peak, PA-C  Cholecalciferol (VITAMIN D) 2000 units tablet Take 1 tablet (2,000 Units total) by mouth daily. 10/07/16  Yes Dena Billet B, PA-C  diazepam (VALIUM) 2 MG tablet Take 1 tablet (2 mg total) by mouth every 6 (six) hours as needed for anxiety. 10/07/16  Yes Dena Billet B, PA-C  metFORMIN (GLUCOPHAGE) 1000 MG tablet TAKE 1 TABLET BY MOUTH TWO  TIMES DAILY WITH MEALS 08/04/17  Yes Dena Billet B, PA-C  metoprolol succinate (TOPROL-XL) 25 MG 24 hr tablet TAKE 1 TABLET BY MOUTH  DAILY  09/21/17  Yes Dena Billet B, PA-C  metroNIDAZOLE (METROGEL) 0.75 % gel Apply 1 application topically 2 (two) times daily. 10/07/16  Yes Orlena Sheldon, PA-C  omeprazole (PRILOSEC) 20 MG capsule Take 1 capsule (20 mg total) by mouth daily. 06/10/17  Yes Dena Billet B, PA-C  pioglitazone (ACTOS) 30 MG tablet TAKE 1 TABLET BY MOUTH  DAILY 08/04/17  Yes Dena Billet B, PA-C  ramipril (ALTACE) 10 MG capsule TAKE ONE CAPSULE BY MOUTH  ONE TIME DAILY IN THE P.M. 09/21/17  Yes Dena Billet B, PA-C  simvastatin (ZOCOR) 20 MG tablet TAKE 1 TABLET BY MOUTH AT  BEDTIME 09/21/17  Yes Orlena Sheldon, PA-C     Allergies  Allergen Reactions  . Codeine Nausea And Vomiting  . Penicillins Rash     Family History  Problem Relation Age of Onset  . Hyperlipidemia Mother   . COPD Father   . Heart disease Neg Hx   . Colon cancer Maternal Aunt   . Colon cancer Maternal Grandfather      Social History   Socioeconomic History  . Marital status: Married    Spouse name: Not on file  . Number of children: 2  . Years of education: Not on file  . Highest education level: Not on file  Occupational History  . Occupation: homemaker  Social Needs  . Financial resource strain: Not on file  . Food insecurity:    Worry: Not on file    Inability: Not on file  . Transportation needs:    Medical: Not on file    Non-medical: Not on file  Tobacco Use  . Smoking status: Former Smoker    Types: Cigarettes    Last attempt to quit: 07/06/1978    Years since quitting: 39.3  . Smokeless tobacco: Never Used  . Tobacco comment: intermittent for a few months  Substance and Sexual Activity  . Alcohol use: Yes    Comment: occasional mixed drink couple per mo  . Drug use: No  . Sexual activity: Not on file  Lifestyle  . Physical activity:    Days per week: Not on file    Minutes per session: Not on file  . Stress: Not on file  Relationships  . Social connections:    Talks on phone: Not on file    Gets together: Not on  file    Attends religious service: Not on file    Active member of club or organization: Not on file    Attends meetings of clubs or organizations: Not on file    Relationship status: Not on file  . Intimate partner violence:    Fear of current or ex partner:  Not on file    Emotionally abused: Not on file    Physically abused: Not on file    Forced sexual activity: Not on file  Other Topics Concern  . Not on file  Social History Narrative   Lives w/ husband     Review of Systems  Constitutional: Negative.  Negative for activity change, appetite change, chills, diaphoresis, fatigue and fever.  HENT: Negative.   Eyes: Negative.   Respiratory: Negative.   Cardiovascular: Negative.   Gastrointestinal: Negative.  Negative for abdominal pain, diarrhea, nausea and vomiting.  Endocrine: Negative.   Genitourinary: Negative.  Negative for dysuria, frequency, hematuria and urgency.  Musculoskeletal: Positive for arthralgias and back pain. Negative for gait problem, joint swelling, myalgias, neck pain and neck stiffness.  Skin: Positive for rash. Negative for color change, pallor and wound.  Allergic/Immunologic: Negative.   Neurological: Negative.  Negative for dizziness, weakness and numbness.  Hematological: Does not bruise/bleed easily.  Psychiatric/Behavioral: Negative.   All other systems reviewed and are negative.      Objective:    Vitals:   10/19/17 0829  BP: 118/70  Pulse: 60  Resp: 16  Temp: 97.7 F (36.5 C)  TempSrc: Oral  SpO2: 98%  Weight: 188 lb 12.8 oz (85.6 kg)  Height: 5\' 1"  (1.549 m)        Physical Exam  Constitutional: She is oriented to person, place, and time. She appears well-developed and well-nourished.  Non-toxic appearance. No distress.  HENT:  Head: Normocephalic and atraumatic.  Right Ear: External ear normal.  Left Ear: External ear normal.  Nose: Nose normal.  Mouth/Throat: Uvula is midline and mucous membranes are normal.  Eyes:  Pupils are equal, round, and reactive to light. Conjunctivae and lids are normal. Right eye exhibits no discharge. Left eye exhibits no discharge. No scleral icterus.  Neck: Normal range of motion and phonation normal. No tracheal deviation present.  Cardiovascular: Normal rate, regular rhythm, intact distal pulses and normal pulses.  Pulmonary/Chest: Effort normal and breath sounds normal. No stridor. No respiratory distress. She has no rhonchi.  Abdominal: Soft. Normal appearance. She exhibits no distension.  Musculoskeletal: Normal range of motion. She exhibits no edema or deformity.       Right hip: She exhibits normal strength, no tenderness and no bony tenderness.       Lumbar back: She exhibits tenderness, bony tenderness and pain. She exhibits no swelling, no edema and no spasm.       Back:  Marked area ttp No bruising B/l hips with normal internal and external rotation, pain with flexion and extension, 5/5 strength   Lymphadenopathy:    She has no cervical adenopathy.  Neurological: She is alert and oriented to person, place, and time. She has normal strength. No sensory deficit. She exhibits normal muscle tone. Coordination normal.  Antalgic steady gait  Skin: Skin is warm, dry and intact. Capillary refill takes less than 2 seconds. Rash noted. She is not diaphoretic. There is erythema. No pallor.  Left breast skin fold 6 x 2.5 cm erythematous rash with raised border, scattered maculopapular rash surrounding, skin intact, no central clearing  Psychiatric: She has a normal mood and affect. Her speech is normal and behavior is normal.  Nursing note and vitals reviewed.         Assessment & Plan:   1. Tailbone injury, initial encounter Injury 2 weeks ago, with blunt trauma to tailbone, continues to have pain with sitting and when getting up and sitting  down, no assessments prior to this presentation, no treatments attempted.  She has low sacral tenderness midline and to  surrounding tissues, no bruising.  Normal internal and external rotation of hips but pain with flexion and extension at the hips will get sacrum, coccyx and hip and pelvis films.  Lower threshold to image with patient's history of back surgery, vitamin D deficiency and osteopenia.  Antalgic gait, normal sensation and strength in the lower extremities - traMADol (ULTRAM) 50 MG tablet; Take 1 tablet (50 mg total) by mouth 2 (two) times daily as needed for up to 5 days for severe pain.  Discussed cautions with narcotic pain medication, first-line Tylenol and ibuprofen, icing reviewed with patient - DG Sacrum/Coccyx - DG HIPS BILAT WITH PELVIS 3-4 VIEWS - Misc. Devices (DONUT PESSARY) MISC; Disp one device, use donut cushion with sitting PRN for pain  2. Intertriginous candidiasis - ketoconazole (NIZORAL) 2 % cream; Apply 1 application topically 2 (two) times daily for 14 days. Rash under left breast and skin fold Discuss applying antifungal cream twice a day until resolved and then apply for 1 more week afterwards Discussed drying powders, decreasing friction with clothing, increasing drying and aeration time and skin folds, close management of her blood sugars Follow-up as needed    Delsa Grana, PA-C 10/19/17 8:42 AM

## 2017-10-24 ENCOUNTER — Other Ambulatory Visit: Payer: Self-pay | Admitting: Physician Assistant

## 2017-12-14 ENCOUNTER — Other Ambulatory Visit: Payer: Self-pay | Admitting: Physician Assistant

## 2018-01-12 ENCOUNTER — Other Ambulatory Visit: Payer: Self-pay | Admitting: Physician Assistant

## 2018-01-12 ENCOUNTER — Other Ambulatory Visit: Payer: Self-pay

## 2018-01-12 ENCOUNTER — Encounter: Payer: Self-pay | Admitting: Physician Assistant

## 2018-01-12 ENCOUNTER — Ambulatory Visit (INDEPENDENT_AMBULATORY_CARE_PROVIDER_SITE_OTHER): Payer: Medicare Other | Admitting: Physician Assistant

## 2018-01-12 VITALS — BP 122/64 | HR 73 | Temp 98.3°F | Resp 16 | Ht 61.0 in | Wt 191.8 lb

## 2018-01-12 DIAGNOSIS — I10 Essential (primary) hypertension: Secondary | ICD-10-CM | POA: Diagnosis not present

## 2018-01-12 DIAGNOSIS — E119 Type 2 diabetes mellitus without complications: Secondary | ICD-10-CM | POA: Diagnosis not present

## 2018-01-12 DIAGNOSIS — M4316 Spondylolisthesis, lumbar region: Secondary | ICD-10-CM

## 2018-01-12 DIAGNOSIS — E785 Hyperlipidemia, unspecified: Secondary | ICD-10-CM

## 2018-01-12 DIAGNOSIS — E559 Vitamin D deficiency, unspecified: Secondary | ICD-10-CM

## 2018-01-12 DIAGNOSIS — M8580 Other specified disorders of bone density and structure, unspecified site: Secondary | ICD-10-CM | POA: Diagnosis not present

## 2018-01-12 DIAGNOSIS — K219 Gastro-esophageal reflux disease without esophagitis: Secondary | ICD-10-CM

## 2018-01-12 MED ORDER — DIAZEPAM 5 MG PO TABS: ORAL_TABLET | ORAL | 0 refills | Status: DC

## 2018-01-12 MED ORDER — HYDROCHLOROTHIAZIDE 12.5 MG PO CAPS: 12.5000 mg | ORAL_CAPSULE | Freq: Every day | ORAL | 11 refills | Status: DC

## 2018-01-12 NOTE — Progress Notes (Unsigned)
MRI had to be changed per Radiology to MRI Lumbar spine w w/o. Per Radiology patient must be prescribed anti anxiety medications per to appointment and must arrive with them in hand at time of procedure and take them when she arrives.

## 2018-01-12 NOTE — Progress Notes (Signed)
Please call in Rx for Valium 5mg  1 - 2 po prior to MRI, as needed for anxiety. # 5 + 0

## 2018-01-12 NOTE — Progress Notes (Signed)
Medication sent into pharmacy per provider.

## 2018-01-12 NOTE — Progress Notes (Signed)
Patient ID: Denise Meadows MRN: [id number], DOB: [birth year], 67 y.o. Date of Encounter: @DATE @  Chief Complaint:  Chief Complaint  Patient presents with  . 6 month diabetes check  . cholesterol check    HPI: 67 y.o. year old white female  presents for routine followup office visit.  Diabetes: In past, I discussed that her  A1c was down and that I was concerned about possible hypoglycemia. However she stated that she is getting no low blood sugar readings. She checks her blood sugar fasting once a week. Always gets between 95-105. Never gets less than 80.  is taking all medications as directed including her Actos 30 mg which was most recently added. Still states this is the case.  In the past she was walking 2.5 miles twice a day. However at her visit in December 2014 she reported that the friend who had been walking with her in the evening had stopped.However they were still walking in the morning. Also, in really cold weather in January and February she did not walk on the days when it was really cold weather with ice or snow.  At [place] 3/16 says she has been walking every morning except weekends.  Just joined a gym. Plans to do treadmill and use trainer there--when she tried to do exercises herself, she had major problem b/c of discs in her back.  Feels the trainer can help her do some exercises safely.  At [place] 03/2015--says that this did not work out. That even with the trainers help her back got aggravated with everything she tried to do at the gym. Says that she is walking outside and is fine with just doing that. Says that in the past when she had back surgery she was told that she could not work out at a gym but she thought that sensitive been so many years ago that she would be able to do it at this point that that was not the case.  At [place] 03/2015--says that she is gaining weight all of a sudden and does not know why.--- Still doing her walking for exercise and careful with her  diet.  At [place] 10/07/15--She has gotten a piece of exercise equipment that she is using now. Says that she has to move her arms and her legs. She does 15 minutes twice a day 5 days a week. At [place] 04/08/2016---still exercising routinely. She is very careful with her diet and eats low carbohydrate diet.   10/07/2016: Since her last routine visit with me, she had an extra visit with me on 09/21/16. At that time she reported that she had some episodes of "just not feeling right". At the time of the episodes she had checked her blood sugar and that was normal. She then checked her blood pressure with that first episode and got a very high reading of 180/105. She checked her blood pressure at other times and got 148/88 and the other readings were around 140/80. So, at that visit I added Norvasc 5 mg daily. Today she reports that she is taking the Norvasc 5 mg and this is causing no adverse effects. She states that she has had a couple more episodes of "just not feeling right" and actually thinks that it may be episodes of anxiety. Says that when it occurs she feels anxious and uptight and tension headache. Says that she had an old bottle of Valium and took half of a Valium (thinks the pill was either 2mg  or 5mg  dose  and she took 1/2) the last time she felt the symptoms and the symptoms resolved. No other concerns to address today. Says that there is no specific increased stress going on in her life right now. She is not having generalized anxiety. Does not feel like she has having uncontrollable mind racing. Says that she has had some trouble sleeping for years but this has not worsened or changed.  Diabetes: She continues to take her diabetic medicines as directed. No adverse effects. She continues to exercise on a routine basis Hypertension: Taking medications as directed with no adverse effects. No lower extremity edema. No lightheadedness.   Hyperlipidemia: in past, lipid panel showed her LDL  very low so I had  her decrease her simvastatin and she is down to 20 mg as directed. No myalgias or other adverse effects.  Even with exertion she is having no  angina symptoms.   At [place] 04/08/2016, wants to check Hep C screen--no other complaints/concerns.   04/08/2017: Diabetes: She continues to take her diabetic medicines as directed. No adverse effects.  At [place] 04/08/2017---Today she brings report from recent eye exam--Performed 03/12/2017---"No Diabetic Retinopathy" She continues to provide good footcare.  Hypertension: Taking medications as directed with no adverse effects. No lower extremity edema. No lightheadedness.   Hyperlipidemia: in past, lipid panel showed her LDL  very low so I had her decrease her simvastatin and she is down to 20 mg as directed. No myalgias or other adverse effects.  Even with exertion she is having no  angina symptoms.   She reports that her mom passed away in 02/01/23. Says that she was 67 years old but still it was unexpected. Says that she loved to can foods etc. and had just been doing that earlier that week. Says that she fell and ended up with a subdural hematoma. Says that in August she and her husband traveled with another couple on their RV to [place]. They were gone 3 weeks. Says that she is afraid her A1c will be up. Says that they did some cooking them sounds and would eat out some. Says that for the most part the other couple was very aware of her diabetes and she was able to make pretty good food choices except on the way to [place] and on the way back they did eat quite a bit of fast food some of those days. Also she reports that she has been having a cough and snotty nose for over a week now. Says that she thinks that last week she developed fevers and chills some but did not actually check her temperature. Says that she is blowing out and coughing up thick green mucus/phlegm.  Reports that she has not gotten back into her daily routine of walking. They were gone on that  trip and then since she has returned she has been taking care of things regarding her mother's death. That they did quite a bit of walking on their trip that she has not gotten back into that routine on a daily basis.  No other concerns to address today.   07/14/2017: Since her last routine office visit with me, she did have an additional visit with me on 05/24/2017 to discuss osteopenia.   Her gynecologist had ordered a bone density scan and recommend that she come here to discuss results and treatment plan.   This was her first bone density scan performed.  Worst T score was -2.2.   At that time she was currently taking vitamin  D 2000 units daily but had not been taking calcium.   At that visit I recommend that she add calcium and today she reports that she is taking the calcium. She is continuing to be very active with exercise.   At that visit, discussed her risk factors for osteporosis.  She had history of a fall which resulted in a fracture to her elbow.  Patient had no other history of fracture.  Did report that her mom had osteoporosis and "was hunched over ". She has not been using steroids.  She has no chronic medical problems that require frequent steroid use.  She does not smoke and has not smoked any significant amount in the past.  Is not thin/frail/underweight.  At that time discussed prescription medication.   She stated that she did not want to add prescription medication unless absolutely necessary.   Planned to continue vitamin D and she would add calcium 1000 units daily.  Discussed weightbearing exercise.  Discussed getting follow-up DEXA scan in 2 years to monitor.  She has no specific concerns to address today. Asked how her holidays have been especially since it was the first Christmas without her mother.  She says that she "made it through".  Says that then she took a trip to [place] to [place] for the [employer].  Says that she had a good time.  Says that she has  been doing a lot of walking and is trying to be compliant with her diet.  Diabetes: She continues to take her diabetic medicines as directed. No adverse effects.  At [place] 04/08/2017---Today she brings report from recent eye exam--Performed 03/12/2017---"No Diabetic Retinopathy" She continues to provide good footcare.  Hypertension: Taking medications as directed with no adverse effects. No lower extremity edema. No lightheadedness.   Hyperlipidemia: in past, lipid panel showed her LDL  very low so I had her decrease her simvastatin and she is down to 20 mg as directed. No myalgias or other adverse effects.  Even with exertion she is having no  angina symptoms.    01/12/2018:  Today she reports that they have been doing some traveling.  States that in March/April they spent 3 weeks driving from [place] to [place] and then looped around and came back up using a different route and stopping at different spots.  It was her and her husband and another couple.  Says that they drove a van. States that in several weeks--- they are going to the [place] and [place].  Discussed that my family went there last summer and that she will really enjoyed that trip. At the end of the visit she does report that she has been having a couple of issues. States that she has been having some swelling in her feet pretty much every day by the end of the day. Also states that she is having discomfort in her lateral thigh and points from her left upper lateral thigh down the lateral thigh towards the knee.  Says that she feels this discomfort in the same area of both of her thighs but the left is much worse than right.  Says that it is not a pain-- that it is a numb sensation.  Says that she can only walk for about 15 minutes and then has to stop secondary to this numbness.  Says that at those times she can pinch the skin in that area and it does not even have much sensation-- that it truly is like a numbness.  Does  not feel  discomfort in her back but just in the lateral aspect of her thigh left greater than right. No other specific concerns to address today. She is in very good spirits and upbeat regarding her travels.  Diabetes: She continues to take her diabetic medicines as directed. No adverse effects.  At [place] 04/08/2017---Today she brings report from recent eye exam--Performed 03/12/2017---"No Diabetic Retinopathy" She continues to provide good footcare.  Hypertension: Taking medications as directed with no adverse effects. No lower extremity edema. No lightheadedness.   Hyperlipidemia: in past, lipid panel showed her LDL  very low so I had her decrease her simvastatin and she is down to 20 mg as directed. No myalgias or other adverse effects.  Even with exertion she is having no  angina symptoms.       Past Medical History:  Diagnosis Date  . Complication of anesthesia   . DDD (degenerative disc disease)   . Diabetes mellitus   . Elevated liver function tests    eval by dr Leonides Grills to fatty liver 2008  . GERD (gastroesophageal reflux disease)   . Hiatal hernia   . Hiatal hernia   . Hyperlipidemia   . Hypertension   . NASH (nonalcoholic steatohepatitis)   . Nephrolithiasis   . PONV (postoperative nausea and vomiting)    heel surgery  . Pulmonary nodule    no change-1995  . Rosacea   . Ulcer    gastric ulcer, antritis-egd 09/06/08  . Vitamin D deficiency      Home Meds: Outpatient Medications Prior to Visit  Medication Sig Dispense Refill  . amLODipine (NORVASC) 5 MG tablet TAKE 1 TABLET BY MOUTH  DAILY 90 tablet 0  . calcium gluconate 500 MG tablet Take 1 tablet (500 mg total) 2 (two) times daily by mouth. 60 tablet 11  . Cholecalciferol (VITAMIN D) 2000 units tablet Take 1 tablet (2,000 Units total) by mouth daily. 90 tablet 2  . diazepam (VALIUM) 2 MG tablet Take 1 tablet (2 mg total) by mouth every 6 (six) hours as needed for anxiety. 30 tablet 0  . metFORMIN (GLUCOPHAGE) 1000  MG tablet TAKE 1 TABLET BY MOUTH TWO  TIMES DAILY WITH MEALS 180 tablet 0  . metoprolol succinate (TOPROL-XL) 25 MG 24 hr tablet TAKE 1 TABLET BY MOUTH  DAILY 90 tablet 0  . metroNIDAZOLE (METROGEL) 0.75 % gel Apply 1 application topically 2 (two) times daily. 45 g 5  . omeprazole (PRILOSEC) 20 MG capsule Take 1 capsule (20 mg total) by mouth daily. 90 capsule 2  . pioglitazone (ACTOS) 30 MG tablet TAKE 1 TABLET BY MOUTH  DAILY 90 tablet 0  . ramipril (ALTACE) 10 MG capsule TAKE ONE CAPSULE BY MOUTH  ONE TIME DAILY IN THE  EVENING 90 capsule 0  . simvastatin (ZOCOR) 20 MG tablet TAKE 1 TABLET BY MOUTH AT  BEDTIME 90 tablet 0   No facility-administered medications prior to visit.      Allergies:  Allergies  Allergen Reactions  . Codeine Nausea And Vomiting  . Penicillins Rash    Social History   Socioeconomic History  . Marital status: Married    Spouse name: Not on file  . Number of children: 2  . Years of education: Not on file  . Highest education level: Not on file  Occupational History  . Occupation: homemaker  Social Needs  . Financial resource strain: Not on file  . Food insecurity:    Worry: Not on file  Inability: Not on file  . Transportation needs:    Medical: Not on file    Non-medical: Not on file  Tobacco Use  . Smoking status: Former Smoker    Types: Cigarettes    Last attempt to quit: 07/06/1978    Years since quitting: 39.5  . Smokeless tobacco: Never Used  . Tobacco comment: intermittent for a few months  Substance and Sexual Activity  . Alcohol use: Yes    Comment: occasional mixed drink couple per mo  . Drug use: No  . Sexual activity: Not on file  Lifestyle  . Physical activity:    Days per week: Not on file    Minutes per session: Not on file  . Stress: Not on file  Relationships  . Social connections:    Talks on phone: Not on file    Gets together: Not on file    Attends religious service: Not on file    Active member of club or  organization: Not on file    Attends meetings of clubs or organizations: Not on file    Relationship status: Not on file  . Intimate partner violence:    Fear of current or ex partner: Not on file    Emotionally abused: Not on file    Physically abused: Not on file    Forced sexual activity: Not on file  Other Topics Concern  . Not on file  Social History Narrative   Lives w/ husband    Family History  Problem Relation Age of Onset  . Hyperlipidemia Mother   . COPD Father   . Heart disease Neg Hx   . Colon cancer Maternal Aunt   . Colon cancer Maternal Grandfather      Review of Systems:  See HPI for pertinent ROS. All other ROS negative.    Physical Exam: Blood pressure 122/64, pulse 73, temperature 98.3 F (36.8 C), temperature source Oral, resp. rate 16, height 5\' 1"  (1.549 m), weight 87 kg (191 lb 12.8 oz), SpO2 97 %., Body mass index is 36.24 kg/m. General: WF. Appears in no acute distress. Neck: Supple. No thyromegaly. No lymphadenopathy.No carotid bruits. Lungs: Clear bilaterally to auscultation without wheezes, rales, or rhonchi. Breathing is unlabored. Heart: RRR with S1 S2. No murmurs, rubs, or gallops. Abdomen: Soft, non-tender, non-distended with normoactive bowel sounds. No hepatomegaly. No rebound/guarding. No obvious abdominal masses. Musculoskeletal:  Strength and tone normal for age. Extremities/Skin: Warm and dry. Trace LE edema.  Neuro: Alert and oriented X 3. Moves all extremities spontaneously. Gait is normal. CNII-XII grossly in tact. Psych:  Responds to questions appropriately with a normal affect.     Diabetic foot exam: Normal inspection. No skin breakdown. No callus. Normal sensation to light touch with monofilament. 2+ bilateral dorsalis pedis pulses and 2+ bilateral posterior tibial pulses.      ASSESSMENT AND PLAN:  67 y.o. year old female with    Lower Extremity Edema /Essential hypertension 01/12/2018: Today her blood pressure is  reading 122/64.   ---------------Today she is reporting that she has been having some lower extremity edema daily recently. -----I will add low-dose HCTZ 12.5 mg daily. -----I will discontinue Norvasc 5 mg---partially in case this is contributing to the edema and also to prevent blood pressure dropping too low when we add the HCTZ. -----She does have blood pressure cuff at home and is to monitor blood pressure at home.  Is to follow-up with me if the swelling is not resolved or if blood pressure  is not controlled. ---- COMPLETE METABOLIC PANEL WITH GFR  Spondylolisthesis of lumbar region 01/12/2018: Will obtain updated MRI lumbar spine to reevaluate lumbar spine. She is reporting symptoms consistent with radiculopathy. Even Back in 2003 MRI at that time showed------ ----L5-S1 there is a left paracentral to left posterior lateral disc herniation which encroaches upon and probably contacts the left L5 nerve root intraforaminally.  Also noted approximate 1 cm soft tissue signal in left lateral recess above the L5-S1 disc space which probably also contacts the L5 nerve root in this region. Also she recently had x-ray of sacrum and coccyx on 10/19/2017.  This showed a grade 2 anterolisthesis L5-S1 with bilateral pars defects of L5. - MR LUMBAR SPINE W CONTRAST; Future   Osteopenia 07/14/2017: She had Office visit with me on 05/24/17 to discuss DEXA scan. T score -2.5. She did not want to add prescription medication unless absolutely necessary. She is currently taking calcium, vitamin D, and doing exercise. She will need follow-up DEXA scan 2 years to monitor. See HPI from 07/14/17 for details. 01/12/2018: She continues taking calcium and vitamin D.  We will repeat DEXA scan 2020.   Type 2 diabetes mellitus with hyperglycemia, without long-term current use of insulin (HCC)  At [place] 04/08/2017---Today she brings report from recent eye exam--Performed 03/12/2017---"No Diabetic Retinopathy" Foot Exam is  normal.  Kidney function and microalbumin are completely normal.  She is on ACE inhibitor.  She is on statin.  NO ASA SECONDARY TO H/O PUD Microalbumin was performed 03/2014, 03/25/2015,04/08/2016, 04/08/2017 -- normal.   04/08/2017---check A1C, CMET, MicroAlbumin to monitor 07/14/2017--check A1C 01/12/2018: Check A1C    HLD (hyperlipidemia) 7/102019: On simvastatin 20 mg. Check lab to monitor  Unspecified vitamin D deficiency 07/14/2017:On 2000 units daily.   Cont current dose.      Recheck vitamin D level 03/25/2015---level was good--41---will wait to recheck---last 2 levels were 53 ---> 41   GERD (gastroesophageal reflux disease) 01/12/2018: Controlled with current medication.  Screening colonoscopy  01/12/2018:  --Had Fall 2013--normal--repeat 10 years  Rosacea 01/12/2018:  This is controlled with MetroGel. Refill sent 03/25/15.  Pelvic exam, Pap smear  07/14/2017: --Per GYN --Dr.Anderson at [facility]---04/08/2017---we are sending them request to send mammo and DEXA reports to [place]  mammogram : 07/14/2017: --Per GYN office --Dr[facility]Anderson at [facility]---04/08/2017---we are sending them request to send mammo and DEXA reports to [place]  DEXA:  See above under Osteopenia   EGD/colonoscopy: 07/14/2017:--- Had these in the fall of 2013.  Immunizations: Tetanus:--  Given here 09/2014 Pneumovax: She defers  Zostavax: Given here 03/25/2015 Influenza: Given here 04/08/2016--04/08/2017----she is to take antibiotics then get flu vaccine once this respiratory infection resolves.      Signed, [address] [place], [place], [facility] 01/12/2018 8:25 AM

## 2018-01-13 LAB — COMPLETE METABOLIC PANEL WITH GFR
AG RATIO: 1.6 (calc) (ref 1.0–2.5)
ALBUMIN MSPROF: 4.3 g/dL (ref 3.6–5.1)
ALT: 13 U/L (ref 6–29)
AST: 17 U/L (ref 10–35)
Alkaline phosphatase (APISO): 83 U/L (ref 33–130)
BILIRUBIN TOTAL: 0.5 mg/dL (ref 0.2–1.2)
BUN / CREAT RATIO: 18 (calc) (ref 6–22)
BUN: 20 mg/dL (ref 7–25)
CHLORIDE: 103 mmol/L (ref 98–110)
CO2: 25 mmol/L (ref 20–32)
Calcium: 9.9 mg/dL (ref 8.6–10.4)
Creat: 1.13 mg/dL — ABNORMAL HIGH (ref 0.50–0.99)
GFR, Est African American: 59 mL/min/{1.73_m2} — ABNORMAL LOW (ref >=60)
GFR, Est Non African American: 51 mL/min/{1.73_m2} — ABNORMAL LOW (ref >=60)
GLOBULIN: 2.7 g/dL (ref 1.9–3.7)
Glucose, Bld: 125 mg/dL — ABNORMAL HIGH (ref 65–99)
POTASSIUM: 4.7 mmol/L (ref 3.5–5.3)
SODIUM: 138 mmol/L (ref 135–146)
TOTAL PROTEIN: 7 g/dL (ref 6.1–8.1)

## 2018-01-13 LAB — LIPID PANEL
Cholesterol: 151 mg/dL (ref <200)
HDL: 42 mg/dL — ABNORMAL LOW (ref >50)
LDL CHOLESTEROL (CALC): 79 mg/dL
Non-HDL Cholesterol (Calc): 109 mg/dL (calc) (ref <130)
Total CHOL/HDL Ratio: 3.6 (calc) (ref <5.0)
Triglycerides: 198 mg/dL — ABNORMAL HIGH (ref <150)

## 2018-01-13 LAB — HEMOGLOBIN A1C
EAG (MMOL/L): 7.3 (calc)
Hgb A1c MFr Bld: 6.2 % of total Hgb — ABNORMAL HIGH (ref <5.7)
MEAN PLASMA GLUCOSE: 131 (calc)

## 2018-01-14 ENCOUNTER — Other Ambulatory Visit: Payer: Self-pay

## 2018-01-14 NOTE — Telephone Encounter (Signed)
Patient was in office 01/12/2018 and this medication was refilled, but it printed can you please send in this refill

## 2018-01-17 ENCOUNTER — Telehealth: Payer: Self-pay | Admitting: Physician Assistant

## 2018-01-17 MED ORDER — DIAZEPAM 5 MG PO TABS: ORAL_TABLET | ORAL | 0 refills | Status: DC

## 2018-01-18 ENCOUNTER — Ambulatory Visit (HOSPITAL_COMMUNITY)
Admission: RE | Admit: 2018-01-18 | Discharge: 2018-01-18 | Disposition: A | Discharge status: Home / Self Care | Payer: Medicare Other | Source: Ambulatory Visit | Attending: Physician Assistant | Admitting: Physician Assistant

## 2018-01-18 DIAGNOSIS — M48061 Spinal stenosis, lumbar region without neurogenic claudication: Secondary | ICD-10-CM | POA: Diagnosis not present

## 2018-01-18 DIAGNOSIS — M5136 Other intervertebral disc degeneration, lumbar region: Secondary | ICD-10-CM | POA: Diagnosis not present

## 2018-01-18 DIAGNOSIS — M4316 Spondylolisthesis, lumbar region: Secondary | ICD-10-CM | POA: Insufficient documentation

## 2018-01-18 DIAGNOSIS — M5126 Other intervertebral disc displacement, lumbar region: Secondary | ICD-10-CM | POA: Diagnosis not present

## 2018-01-18 MED ORDER — GADOBENATE DIMEGLUMINE 529 MG/ML IV SOLN
18.0000 mL | Freq: Once | INTRAVENOUS | Status: AC | PRN
  Administered 2018-01-18: 18 mL via INTRAVENOUS

## 2018-01-20 ENCOUNTER — Other Ambulatory Visit: Payer: Self-pay | Admitting: Physician Assistant

## 2018-01-20 ENCOUNTER — Other Ambulatory Visit: Payer: Self-pay

## 2018-01-20 DIAGNOSIS — R9389 Abnormal findings on diagnostic imaging of other specified body structures: Secondary | ICD-10-CM

## 2018-02-07 ENCOUNTER — Telehealth: Payer: Self-pay

## 2018-02-07 NOTE — Telephone Encounter (Signed)
Patient called and states the hydrochlorothiazide is not working for her she is only urinating 2 times at night and she is not getting rid of the fluid, Patient would like to know if there is something else she can try she states her blood pressure has been good/ pls advise

## 2018-02-08 NOTE — Telephone Encounter (Signed)
Call placed to patient lvmtrc

## 2018-02-08 NOTE — Telephone Encounter (Signed)
Currently, HCTZ is 12.5 mg. Increase to 25 mg 1 po Q AM # 30 + 3.

## 2018-02-10 MED ORDER — HYDROCHLOROTHIAZIDE 25 MG PO TABS: 25.0000 mg | ORAL_TABLET | ORAL | 3 refills | Status: DC

## 2018-02-10 NOTE — Telephone Encounter (Signed)
Patient called she is aware of the dose change, Rx has been sent to [facility] as requested

## 2018-03-03 DIAGNOSIS — M4317 Spondylolisthesis, lumbosacral region: Secondary | ICD-10-CM | POA: Diagnosis not present

## 2018-03-03 DIAGNOSIS — M549 Dorsalgia, unspecified: Secondary | ICD-10-CM | POA: Diagnosis not present

## 2018-03-14 ENCOUNTER — Other Ambulatory Visit: Payer: Self-pay | Admitting: Physician Assistant

## 2018-03-21 DIAGNOSIS — M5136 Other intervertebral disc degeneration, lumbar region: Secondary | ICD-10-CM | POA: Diagnosis not present

## 2018-04-04 DIAGNOSIS — M5136 Other intervertebral disc degeneration, lumbar region: Secondary | ICD-10-CM | POA: Diagnosis not present

## 2018-04-04 DIAGNOSIS — M4317 Spondylolisthesis, lumbosacral region: Secondary | ICD-10-CM | POA: Diagnosis not present

## 2018-04-27 ENCOUNTER — Other Ambulatory Visit: Payer: Self-pay | Admitting: Physician Assistant

## 2018-05-12 ENCOUNTER — Other Ambulatory Visit: Payer: Self-pay | Admitting: Physician Assistant

## 2018-07-12 ENCOUNTER — Other Ambulatory Visit: Payer: Self-pay | Admitting: Family Medicine

## 2018-07-15 ENCOUNTER — Other Ambulatory Visit: Payer: Self-pay | Admitting: *Deleted

## 2018-07-15 MED ORDER — RAMIPRIL 10 MG PO CAPS: 10.0000 mg | ORAL_CAPSULE | Freq: Every day | ORAL | 0 refills | Status: DC

## 2018-07-15 MED ORDER — METOPROLOL SUCCINATE ER 25 MG PO TB24: 25.0000 mg | ORAL_TABLET | Freq: Every day | ORAL | 0 refills | Status: DC

## 2018-07-20 ENCOUNTER — Ambulatory Visit (INDEPENDENT_AMBULATORY_CARE_PROVIDER_SITE_OTHER): Payer: Medicare Other | Admitting: Family Medicine

## 2018-07-20 ENCOUNTER — Other Ambulatory Visit: Payer: Self-pay

## 2018-07-20 ENCOUNTER — Encounter: Payer: Self-pay | Admitting: Family Medicine

## 2018-07-20 ENCOUNTER — Ambulatory Visit: Payer: Medicare Other | Admitting: Physician Assistant

## 2018-07-20 VITALS — BP 128/74 | HR 66 | Temp 97.9°F | Resp 14 | Ht 61.0 in | Wt 195.0 lb

## 2018-07-20 DIAGNOSIS — R6889 Other general symptoms and signs: Secondary | ICD-10-CM | POA: Diagnosis not present

## 2018-07-20 DIAGNOSIS — E785 Hyperlipidemia, unspecified: Secondary | ICD-10-CM

## 2018-07-20 DIAGNOSIS — R5383 Other fatigue: Secondary | ICD-10-CM | POA: Diagnosis not present

## 2018-07-20 DIAGNOSIS — I10 Essential (primary) hypertension: Secondary | ICD-10-CM

## 2018-07-20 DIAGNOSIS — E119 Type 2 diabetes mellitus without complications: Secondary | ICD-10-CM | POA: Diagnosis not present

## 2018-07-20 DIAGNOSIS — Z6836 Body mass index (BMI) 36.0-36.9, adult: Secondary | ICD-10-CM

## 2018-07-20 DIAGNOSIS — R0600 Dyspnea, unspecified: Secondary | ICD-10-CM

## 2018-07-20 DIAGNOSIS — E66812 Obesity, class 2: Secondary | ICD-10-CM

## 2018-07-20 DIAGNOSIS — E669 Obesity, unspecified: Secondary | ICD-10-CM | POA: Insufficient documentation

## 2018-07-20 NOTE — Progress Notes (Signed)
Subjective:    Patient ID: Denise Meadows, female    DOB: 23-Feb-1951, 68 y.o.   MRN: [id number]  Patient presents for Follow-up (is fasting)    Chronic back pain- followed by Dr Rennis Harding, has had spinal injections, she has bilat numbness and pain in lower ext, Had DDD, cracks L5  Has not had PT.    OB/GYN- Dr. Ouida Sills, [facility]  ( Mammogram/ Bone Density )    Osteopenia- on Bone Density Nov 2018, on calcium and Vitamin D     DM- CBG checked once a week 99-120, last 6.2%, taking metformin 1000mg  BID an Actos    Eye - [place]    Hyperlipidemia- taking zocor   HTN- No history of MI/Stroke, taking HCTZ and metoprolol , ramipril   GAD- rarely takes valium 2mg  for attacks   Rosacea- uses metrogel for flares    GERD- uses prn prilosec    Gets very tired very easily , just showering  Or doing regular housework causes severe fatigue.  These episodes do not occur every day but has been progressive over the past few months.  Feels short of breath during episodes and feels weak in general  She tried working out at the [employer] worked out or 1 hour three days a week and walking 2 days and did not lose weight.  She feels like if she could lose weight and her symptoms would improve. Eats small portions, often not hungry in the mornings, tries to watch her diet.       Review Of Systems:  GEN- + fatigue, denies  fever, weight loss,weakness, recent illness HEENT- denies eye drainage, change in vision, nasal discharge, CVS- denies chest pain, palpitations RESP- denies SOB, cough, wheeze ABD- denies N/V, change in stools, abd pain GU- denies dysuria, hematuria, dribbling, incontinence MSK- + joint pain, denies muscle aches, injury Neuro- denies headache, dizziness, syncope, seizure activity       Objective:    BP 128/74   Pulse 66   Temp 97.9 F (36.6 C) (Oral)   Resp 14   Ht 5\' 1"  (1.549 m)   Wt 195 lb (88.5 kg)   SpO2 100%   BMI 36.84 kg/m  GEN- NAD, alert and  oriented x3,obese  HEENT- PERRL, EOMI, non injected sclera, pink conjunctiva, MMM, oropharynx clear Neck- Supple, no thyromegaly CVS- RRR, no murmur RESP-CTAB ABD-NABS,soft,NT,ND Psych- normal affect and  Mood  EXT-bilateral ankle swelling edema Pulses- Radial, DP- 2+        Assessment & Plan:      Problem List Items Addressed This Visit      Unprioritized   Diabetes mellitus type 2, uncomplicated (HCC) (Chronic)    Diabetes has been well controlled recheck her A1c goal less than 7%      Relevant Orders   Microalbumin / creatinine urine ratio   CBC with Differential/Platelet   Comprehensive metabolic panel   Hemoglobin A1c   Lipid panel   Vitamin B12   HLD (hyperlipidemia) (Chronic)    She is on statin drug      HTN (hypertension) - Primary (Chronic)    Blood pressure well controlled.  I am concerned about her tiring out so easily with her regular activities such as showering and cleaning.  We will check her labs to ensure she does not have any metabolic disturbances that need to be resolved.  Next episode to obtain 2D echo she does have risk factors for heart disease.  I do  not see any sign of significant fluid overload on exam she does have chronic mild ankle edema.  We will also check a B12 level on her.      Relevant Orders   TSH   Obese    Severe obesity in setting of diabetes and hyperlipidemia.  If her her labs are normal cardiac work-up is normal I would consider trying her on phentermine to help with weight loss.         Other Visit Diagnoses    Fatigue, unspecified type       Relevant Orders   Vitamin B12      Note: This dictation was prepared with Dragon dictation along with smaller phrase technology. Any transcriptional errors that result from this process are unintentional.

## 2018-07-20 NOTE — Assessment & Plan Note (Signed)
Blood pressure well controlled.  I am concerned about her tiring out so easily with her regular activities such as showering and cleaning.  We will check her labs to ensure she does not have any metabolic disturbances that need to be resolved.  Next episode to obtain 2D echo she does have risk factors for heart disease.  I do not see any sign of significant fluid overload on exam she does have chronic mild ankle edema.  We will also check a B12 level on her.

## 2018-07-20 NOTE — Patient Instructions (Addendum)
Shingles Vaccine  Phentermine for weight loss  Schedule eye appointment F/U 4 months

## 2018-07-20 NOTE — Assessment & Plan Note (Signed)
Severe obesity in setting of diabetes and hyperlipidemia.  If her her labs are normal cardiac work-up is normal I would consider trying her on phentermine to help with weight loss.

## 2018-07-20 NOTE — Assessment & Plan Note (Signed)
Diabetes has been well controlled recheck her A1c goal less than 7%

## 2018-07-20 NOTE — Assessment & Plan Note (Signed)
She is on statin drug

## 2018-07-21 ENCOUNTER — Encounter: Payer: Self-pay | Admitting: *Deleted

## 2018-07-21 LAB — LIPID PANEL
Cholesterol: 170 mg/dL (ref <200)
HDL: 46 mg/dL — AB (ref >50)
LDL Cholesterol (Calc): 92 mg/dL (calc)
Non-HDL Cholesterol (Calc): 124 mg/dL (calc) (ref <130)
Total CHOL/HDL Ratio: 3.7 (calc) (ref <5.0)
Triglycerides: 234 mg/dL — ABNORMAL HIGH (ref <150)

## 2018-07-21 LAB — CBC WITH DIFFERENTIAL/PLATELET
ABSOLUTE MONOCYTES: 756 {cells}/uL (ref 200–950)
BASOS ABS: 37 {cells}/uL (ref 0–200)
Basophils Relative: 0.6 %
Eosinophils Absolute: 118 cells/uL (ref 15–500)
Eosinophils Relative: 1.9 %
HCT: 36.1 % (ref 35.0–45.0)
Hemoglobin: 12.2 g/dL (ref 11.7–15.5)
Lymphs Abs: 1531 cells/uL (ref 850–3900)
MCH: 32.3 pg (ref 27.0–33.0)
MCHC: 33.8 g/dL (ref 32.0–36.0)
MCV: 95.5 fL (ref 80.0–100.0)
MPV: 12.4 fL (ref 7.5–12.5)
Monocytes Relative: 12.2 %
NEUTROS PCT: 60.6 %
Neutro Abs: 3757 cells/uL (ref 1500–7800)
PLATELETS: 271 10*3/uL (ref 140–400)
RBC: 3.78 10*6/uL — AB (ref 3.80–5.10)
RDW: 12.7 % (ref 11.0–15.0)
TOTAL LYMPHOCYTE: 24.7 %
WBC: 6.2 10*3/uL (ref 3.8–10.8)

## 2018-07-21 LAB — COMPREHENSIVE METABOLIC PANEL
AG Ratio: 1.6 (calc) (ref 1.0–2.5)
ALT: 14 U/L (ref 6–29)
AST: 16 U/L (ref 10–35)
Albumin: 4.2 g/dL (ref 3.6–5.1)
Alkaline phosphatase (APISO): 76 U/L (ref 33–130)
BUN/Creatinine Ratio: 20 (calc) (ref 6–22)
BUN: 22 mg/dL (ref 7–25)
CALCIUM: 9.8 mg/dL (ref 8.6–10.4)
CO2: 28 mmol/L (ref 20–32)
CREATININE: 1.1 mg/dL — AB (ref 0.50–0.99)
Chloride: 103 mmol/L (ref 98–110)
Globulin: 2.7 g/dL (calc) (ref 1.9–3.7)
Glucose, Bld: 114 mg/dL — ABNORMAL HIGH (ref 65–99)
Potassium: 4.4 mmol/L (ref 3.5–5.3)
Sodium: 137 mmol/L (ref 135–146)
Total Bilirubin: 0.4 mg/dL (ref 0.2–1.2)
Total Protein: 6.9 g/dL (ref 6.1–8.1)

## 2018-07-21 LAB — MICROALBUMIN / CREATININE URINE RATIO
CREATININE, URINE: 113 mg/dL (ref 20–275)
MICROALB UR: 4.4 mg/dL
Microalb Creat Ratio: 39 mcg/mg creat — ABNORMAL HIGH (ref <30)

## 2018-07-21 LAB — TSH: TSH: 2.69 mIU/L (ref 0.40–4.50)

## 2018-07-21 LAB — HEMOGLOBIN A1C
HEMOGLOBIN A1C: 6.5 %{Hb} — AB (ref <5.7)
Mean Plasma Glucose: 140 (calc)
eAG (mmol/L): 7.7 (calc)

## 2018-07-21 LAB — VITAMIN B12: Vitamin B-12: 332 pg/mL (ref 200–1100)

## 2018-07-21 MED ORDER — FISH OIL 1000 MG PO CAPS: 1.0000 | ORAL_CAPSULE | Freq: Two times a day (BID) | ORAL | 0 refills | Status: AC

## 2018-07-21 MED ORDER — VITAMIN B-12 100 MCG PO TABS: 100.0000 ug | ORAL_TABLET | Freq: Every day | ORAL | Status: AC

## 2018-07-21 NOTE — Addendum Note (Signed)
Addended by: Vic Blackbird F on: 07/21/2018 10:37 AM   Modules accepted: Orders

## 2018-07-23 ENCOUNTER — Encounter: Payer: Self-pay | Admitting: *Deleted

## 2018-07-26 ENCOUNTER — Other Ambulatory Visit: Payer: Self-pay

## 2018-07-26 ENCOUNTER — Ambulatory Visit (HOSPITAL_COMMUNITY): Payer: Medicare Other | Attending: Cardiovascular Disease

## 2018-07-26 DIAGNOSIS — R5383 Other fatigue: Secondary | ICD-10-CM | POA: Insufficient documentation

## 2018-07-26 DIAGNOSIS — R0600 Dyspnea, unspecified: Secondary | ICD-10-CM | POA: Insufficient documentation

## 2018-07-26 DIAGNOSIS — I10 Essential (primary) hypertension: Secondary | ICD-10-CM

## 2018-07-27 ENCOUNTER — Telehealth: Payer: Self-pay | Admitting: *Deleted

## 2018-07-27 DIAGNOSIS — R0602 Shortness of breath: Secondary | ICD-10-CM

## 2018-07-27 DIAGNOSIS — R6882 Decreased libido: Secondary | ICD-10-CM | POA: Insufficient documentation

## 2018-07-27 NOTE — Telephone Encounter (Signed)
CXR ordered.   Call placed to patient. [place].

## 2018-07-27 NOTE — Telephone Encounter (Signed)
Okay to order chest xray  Advise granuloma are just calcification they don't typically  cause shortness of breath, but we will recheck and see if anything else is there

## 2018-07-27 NOTE — Telephone Encounter (Signed)
Call placed to patient in regards to labs/ ECHO results.   States that she has a small area in L lung and is concerned tht it may be growing and causing her to become fatigued.   Noted that last CXR done in 2003: [facility]. CALCIFIED GRANULOMA SEEN IN THE LEFT UPPER LOBE. NO OTHER OPACITIES ARE SEEN IN THE LUNGS. NO EFFUSIONS. BONY THORAX IS INTACT.  Patient inquiring as to if she should have another CXR to check the area.   MD please advise.

## 2018-07-28 ENCOUNTER — Other Ambulatory Visit: Payer: Self-pay | Admitting: *Deleted

## 2018-07-28 NOTE — Telephone Encounter (Signed)
Call placed to patient and patient made aware.  

## 2018-07-28 NOTE — Telephone Encounter (Signed)
Call placed to patient with labs.   States that she is agreeable to Phentermine.   Medication pended and routed to MD to sign.

## 2018-07-29 ENCOUNTER — Ambulatory Visit (HOSPITAL_COMMUNITY)
Admission: RE | Admit: 2018-07-29 | Discharge: 2018-07-29 | Disposition: A | Discharge status: Home / Self Care | Payer: Medicare Other | Source: Ambulatory Visit | Attending: Family Medicine | Admitting: Family Medicine

## 2018-07-29 DIAGNOSIS — R0602 Shortness of breath: Secondary | ICD-10-CM

## 2018-07-29 DIAGNOSIS — R05 Cough: Secondary | ICD-10-CM | POA: Diagnosis not present

## 2018-07-29 MED ORDER — PHENTERMINE HCL 37.5 MG PO TABS: 37.5000 mg | ORAL_TABLET | Freq: Every day | ORAL | 2 refills | Status: DC

## 2018-07-29 NOTE — Telephone Encounter (Signed)
Medication sent in Needs OV in 6 weeks Also CXR was ordered still needs to go get

## 2018-08-01 ENCOUNTER — Other Ambulatory Visit: Payer: Self-pay | Admitting: *Deleted

## 2018-08-01 DIAGNOSIS — R0602 Shortness of breath: Secondary | ICD-10-CM

## 2018-08-01 DIAGNOSIS — R911 Solitary pulmonary nodule: Secondary | ICD-10-CM

## 2018-08-17 ENCOUNTER — Ambulatory Visit (HOSPITAL_COMMUNITY)
Admission: RE | Admit: 2018-08-17 | Discharge: 2018-08-17 | Disposition: A | Discharge status: Home / Self Care | Payer: Medicare Other | Source: Ambulatory Visit | Attending: Family Medicine | Admitting: Family Medicine

## 2018-08-17 DIAGNOSIS — R0602 Shortness of breath: Secondary | ICD-10-CM | POA: Insufficient documentation

## 2018-08-17 DIAGNOSIS — R918 Other nonspecific abnormal finding of lung field: Secondary | ICD-10-CM | POA: Diagnosis not present

## 2018-08-17 DIAGNOSIS — R911 Solitary pulmonary nodule: Secondary | ICD-10-CM | POA: Diagnosis not present

## 2018-08-17 MED ORDER — IOHEXOL 300 MG/ML  SOLN
75.0000 mL | Freq: Once | INTRAMUSCULAR | Status: AC | PRN
  Administered 2018-08-17: 75 mL via INTRAVENOUS

## 2018-08-18 DIAGNOSIS — Z124 Encounter for screening for malignant neoplasm of cervix: Secondary | ICD-10-CM | POA: Diagnosis not present

## 2018-08-18 DIAGNOSIS — Z1231 Encounter for screening mammogram for malignant neoplasm of breast: Secondary | ICD-10-CM | POA: Diagnosis not present

## 2018-08-19 ENCOUNTER — Telehealth: Payer: Self-pay | Admitting: Family Medicine

## 2018-08-19 ENCOUNTER — Other Ambulatory Visit: Payer: Self-pay | Admitting: Family Medicine

## 2018-08-19 ENCOUNTER — Telehealth: Payer: Self-pay

## 2018-08-19 DIAGNOSIS — I5189 Other ill-defined heart diseases: Secondary | ICD-10-CM

## 2018-08-19 MED ORDER — AZITHROMYCIN 500 MG PO TABS: 500.0000 mg | ORAL_TABLET | Freq: Every day | ORAL | 0 refills | Status: DC

## 2018-08-19 NOTE — Telephone Encounter (Signed)
Patient would like a referral to Denise Meadows regarding her Echo results.

## 2018-08-19 NOTE — Telephone Encounter (Signed)
Referral placed.

## 2018-08-19 NOTE — Progress Notes (Signed)
See result notes on CT scan

## 2018-08-19 NOTE — Telephone Encounter (Signed)
Spoke with patient regarding results

## 2018-08-19 NOTE — Telephone Encounter (Signed)
Calling for CT results

## 2018-08-27 ENCOUNTER — Other Ambulatory Visit: Payer: Self-pay | Admitting: Family Medicine

## 2018-08-29 ENCOUNTER — Other Ambulatory Visit: Payer: Self-pay

## 2018-08-29 MED ORDER — HYDROCHLOROTHIAZIDE 25 MG PO TABS: 25.0000 mg | ORAL_TABLET | ORAL | 3 refills | Status: DC

## 2018-09-02 ENCOUNTER — Telehealth: Payer: Self-pay | Admitting: *Deleted

## 2018-09-02 NOTE — Telephone Encounter (Signed)
Received call from patient.   Reports that she had a few questions and requested return call.   Call placed to patient. [place].

## 2018-09-02 NOTE — Telephone Encounter (Signed)
I have changed the referral to [facility] in [place] for Dr. Ottie Glazier pending an appt.

## 2018-09-02 NOTE — Telephone Encounter (Signed)
Patient returned call.   Reports that she would like to change cardiology referral to Dr. Ottie Glazier.

## 2018-09-27 ENCOUNTER — Other Ambulatory Visit: Payer: Self-pay | Admitting: *Deleted

## 2018-09-27 MED ORDER — HYDROCHLOROTHIAZIDE 25 MG PO TABS: 25.0000 mg | ORAL_TABLET | ORAL | 3 refills | Status: DC

## 2018-11-07 ENCOUNTER — Other Ambulatory Visit: Payer: Self-pay | Admitting: Family Medicine

## 2018-11-08 ENCOUNTER — Other Ambulatory Visit: Payer: Self-pay | Admitting: Family Medicine

## 2018-11-21 ENCOUNTER — Ambulatory Visit: Payer: Medicare Other | Admitting: Family Medicine

## 2018-11-23 ENCOUNTER — Other Ambulatory Visit: Payer: Self-pay | Admitting: Family Medicine

## 2018-11-30 ENCOUNTER — Ambulatory Visit (INDEPENDENT_AMBULATORY_CARE_PROVIDER_SITE_OTHER): Payer: Medicare Other | Admitting: Family Medicine

## 2018-11-30 ENCOUNTER — Other Ambulatory Visit: Payer: Self-pay

## 2018-11-30 VITALS — BP 118/68 | HR 62 | Temp 98.2°F | Resp 18 | Ht 61.0 in | Wt 193.6 lb

## 2018-11-30 DIAGNOSIS — E785 Hyperlipidemia, unspecified: Secondary | ICD-10-CM

## 2018-11-30 DIAGNOSIS — E119 Type 2 diabetes mellitus without complications: Secondary | ICD-10-CM | POA: Diagnosis not present

## 2018-11-30 DIAGNOSIS — Z6836 Body mass index (BMI) 36.0-36.9, adult: Secondary | ICD-10-CM

## 2018-11-30 DIAGNOSIS — R918 Other nonspecific abnormal finding of lung field: Secondary | ICD-10-CM | POA: Diagnosis not present

## 2018-11-30 DIAGNOSIS — R0602 Shortness of breath: Secondary | ICD-10-CM

## 2018-11-30 DIAGNOSIS — I10 Essential (primary) hypertension: Secondary | ICD-10-CM | POA: Diagnosis not present

## 2018-11-30 DIAGNOSIS — I5189 Other ill-defined heart diseases: Secondary | ICD-10-CM

## 2018-11-30 NOTE — Progress Notes (Signed)
   Subjective:    Patient ID: Denise Meadows, female    DOB: May 19, 1951, 68 y.o.   MRN: [id number]  Patient presents for Follow-up (htn, dm, hyperlipid) Patient here to follow-up chronic medical problems.  Medications reviewed. Diabetes mellitus last A1c she is currently on metformin and Actos - Last A1C 6.5%, did not bring readings with her   Hypertension taking hydrochlorothiazide metoprolol and ramipril without any difficulty  No HA or dizziness   History of  anxiety has not required her Valium   Feeling well    B12- deficincy taking once a day    SOB with exertion, still gets winded after getting a shower. Has appt to establish with cardiology next month due to COVID-19  She also had abnormal CT of chest with lung nodules needs repeat scan.   Had [facility] done at [facility]/ Aug 18, 2018  Weight down 2lbs    [facility]    Review Of Systems:  GEN- denies fatigue, fever, weight loss,weakness, recent illness HEENT- denies eye drainage, change in vision, nasal discharge, CVS- denies chest pain, palpitations RESP- denies SOB, cough, wheeze ABD- denies N/V, change in stools, abd pain GU- denies dysuria, hematuria, dribbling, incontinence MSK- denies joint pain, muscle aches, injury Neuro- denies headache, dizziness, syncope, seizure activity       Objective:    BP 118/68   Pulse 62   Temp 98.2 F (36.8 C)   Resp 18   Ht 5\' 1"  (1.549 m)   Wt 193 lb 9.6 oz (87.8 kg)   SpO2 100%   BMI 36.58 kg/m  GEN- NAD, alert and oriented x3 HEENT- PERRL, EOMI, non injected sclera, pink conjunctiva, MMM, oropharynx clear Neck- Supple, no thyromegaly CVS- RRR, no murmur RESP-CTAB ABD-NABS,soft,NT,ND EXT- bilat trace ankle swelling  edema Pulses- Radial, DP- 2+        Assessment & Plan:      Problem List Items Addressed This Visit      Unprioritized   Diabetes mellitus type 2, uncomplicated (HCC) (Chronic)    Has been well controlled goal A1C less  than 7% On statin and ACEI      Relevant Orders   Hemoglobin A1c (Completed)   HM DIABETES FOOT EXAM (Completed)   Diastolic dysfunction    Chronic SOB, scheduled with cardiology has many risk factors for cardiac disease  Recheck CT chest with lung nodules, also had atypical PNA treated 3 months ago       HLD (hyperlipidemia) (Chronic)   Relevant Orders   Lipid panel (Completed)   HTN (hypertension) - Primary (Chronic)    Well controlled no changes       Relevant Orders   CBC with Differential/Platelet (Completed)   Comprehensive metabolic panel (Completed)   Obese    Other Visit Diagnoses    Multiple lung nodules on CT       Relevant Orders   CT Chest Wo Contrast   Shortness of breath       Relevant Orders   CT Chest Wo Contrast      Note: This dictation was prepared with Dragon dictation along with smaller phrase technology. Any transcriptional errors that result from this process are unintentional.

## 2018-11-30 NOTE — Patient Instructions (Addendum)
CT chest to be done - [facility]  We will call with lab results  F/U 4  months  For Wellness Exam

## 2018-12-01 ENCOUNTER — Encounter: Payer: Self-pay | Admitting: Family Medicine

## 2018-12-01 ENCOUNTER — Other Ambulatory Visit: Payer: Self-pay

## 2018-12-01 DIAGNOSIS — D649 Anemia, unspecified: Secondary | ICD-10-CM

## 2018-12-01 DIAGNOSIS — I5189 Other ill-defined heart diseases: Secondary | ICD-10-CM | POA: Insufficient documentation

## 2018-12-01 DIAGNOSIS — D619 Aplastic anemia, unspecified: Secondary | ICD-10-CM

## 2018-12-01 DIAGNOSIS — N179 Acute kidney failure, unspecified: Secondary | ICD-10-CM

## 2018-12-01 LAB — CBC WITH DIFFERENTIAL/PLATELET
Absolute Monocytes: 648 cells/uL (ref 200–950)
Basophils Absolute: 48 cells/uL (ref 0–200)
Basophils Relative: 1 %
Eosinophils Absolute: 120 cells/uL (ref 15–500)
Eosinophils Relative: 2.5 %
HCT: 34.4 % — ABNORMAL LOW (ref 35.0–45.0)
Hemoglobin: 11.3 g/dL — ABNORMAL LOW (ref 11.7–15.5)
Lymphs Abs: 1445 cells/uL (ref 850–3900)
MCH: 31.1 pg (ref 27.0–33.0)
MCHC: 32.8 g/dL (ref 32.0–36.0)
MCV: 94.8 fL (ref 80.0–100.0)
MPV: 12.3 fL (ref 7.5–12.5)
Monocytes Relative: 13.5 %
Neutro Abs: 2539 cells/uL (ref 1500–7800)
Neutrophils Relative %: 52.9 %
Platelets: 266 10*3/uL (ref 140–400)
RBC: 3.63 10*6/uL — ABNORMAL LOW (ref 3.80–5.10)
RDW: 12.7 % (ref 11.0–15.0)
Total Lymphocyte: 30.1 %
WBC: 4.8 10*3/uL (ref 3.8–10.8)

## 2018-12-01 LAB — COMPREHENSIVE METABOLIC PANEL
AG Ratio: 1.4 (calc) (ref 1.0–2.5)
ALT: 13 U/L (ref 6–29)
AST: 17 U/L (ref 10–35)
Albumin: 3.9 g/dL (ref 3.6–5.1)
Alkaline phosphatase (APISO): 70 U/L (ref 37–153)
BUN/Creatinine Ratio: 27 (calc) — ABNORMAL HIGH (ref 6–22)
BUN: 34 mg/dL — ABNORMAL HIGH (ref 7–25)
CO2: 25 mmol/L (ref 20–32)
Calcium: 9.9 mg/dL (ref 8.6–10.4)
Chloride: 104 mmol/L (ref 98–110)
Creat: 1.25 mg/dL — ABNORMAL HIGH (ref 0.50–0.99)
Globulin: 2.8 g/dL (calc) (ref 1.9–3.7)
Glucose, Bld: 99 mg/dL (ref 65–99)
Potassium: 5.2 mmol/L (ref 3.5–5.3)
Sodium: 137 mmol/L (ref 135–146)
Total Bilirubin: 0.4 mg/dL (ref 0.2–1.2)
Total Protein: 6.7 g/dL (ref 6.1–8.1)

## 2018-12-01 LAB — HEMOGLOBIN A1C
Hgb A1c MFr Bld: 6 % of total Hgb — ABNORMAL HIGH (ref <5.7)
Mean Plasma Glucose: 126 (calc)
eAG (mmol/L): 7 (calc)

## 2018-12-01 LAB — LIPID PANEL
Cholesterol: 167 mg/dL (ref <200)
HDL: 43 mg/dL — ABNORMAL LOW (ref >=50)
LDL Cholesterol (Calc): 93 mg/dL (calc)
Non-HDL Cholesterol (Calc): 124 mg/dL (calc) (ref <130)
Total CHOL/HDL Ratio: 3.9 (calc) (ref <5.0)
Triglycerides: 213 mg/dL — ABNORMAL HIGH (ref <150)

## 2018-12-01 NOTE — Assessment & Plan Note (Signed)
Well controlled no changes 

## 2018-12-01 NOTE — Assessment & Plan Note (Signed)
Has been well controlled goal A1C less than 7% On statin and ACEI

## 2018-12-01 NOTE — Assessment & Plan Note (Signed)
Chronic SOB, scheduled with cardiology has many risk factors for cardiac disease  Recheck CT chest with lung nodules, also had atypical PNA treated 3 months ago

## 2018-12-06 ENCOUNTER — Other Ambulatory Visit: Payer: Self-pay | Admitting: Family Medicine

## 2018-12-14 ENCOUNTER — Other Ambulatory Visit: Payer: Self-pay

## 2018-12-14 ENCOUNTER — Other Ambulatory Visit: Payer: Medicare Other

## 2018-12-14 DIAGNOSIS — D649 Anemia, unspecified: Secondary | ICD-10-CM

## 2018-12-14 DIAGNOSIS — N179 Acute kidney failure, unspecified: Secondary | ICD-10-CM | POA: Diagnosis not present

## 2018-12-15 LAB — CBC WITH DIFFERENTIAL/PLATELET
Absolute Monocytes: 686 cells/uL (ref 200–950)
Basophils Absolute: 39 cells/uL (ref 0–200)
Basophils Relative: 0.8 %
Eosinophils Absolute: 172 cells/uL (ref 15–500)
Eosinophils Relative: 3.5 %
HCT: 34.7 % — ABNORMAL LOW (ref 35.0–45.0)
Hemoglobin: 11.5 g/dL — ABNORMAL LOW (ref 11.7–15.5)
Lymphs Abs: 1436 cells/uL (ref 850–3900)
MCH: 31.8 pg (ref 27.0–33.0)
MCHC: 33.1 g/dL (ref 32.0–36.0)
MCV: 95.9 fL (ref 80.0–100.0)
MPV: 12.4 fL (ref 7.5–12.5)
Monocytes Relative: 14 %
Neutro Abs: 2568 cells/uL (ref 1500–7800)
Neutrophils Relative %: 52.4 %
Platelets: 246 10*3/uL (ref 140–400)
RBC: 3.62 10*6/uL — ABNORMAL LOW (ref 3.80–5.10)
RDW: 12.5 % (ref 11.0–15.0)
Total Lymphocyte: 29.3 %
WBC: 4.9 10*3/uL (ref 3.8–10.8)

## 2018-12-15 LAB — BASIC METABOLIC PANEL
BUN/Creatinine Ratio: 25 (calc) — ABNORMAL HIGH (ref 6–22)
BUN: 33 mg/dL — ABNORMAL HIGH (ref 7–25)
CO2: 25 mmol/L (ref 20–32)
Calcium: 9.6 mg/dL (ref 8.6–10.4)
Chloride: 105 mmol/L (ref 98–110)
Creat: 1.32 mg/dL — ABNORMAL HIGH (ref 0.50–0.99)
Glucose, Bld: 93 mg/dL (ref 65–99)
Potassium: 5 mmol/L (ref 3.5–5.3)
Sodium: 138 mmol/L (ref 135–146)

## 2018-12-15 LAB — IRON,TIBC AND FERRITIN PANEL
%SAT: 23 % (calc) (ref 16–45)
Ferritin: 55 ng/mL (ref 16–288)
Iron: 92 ug/dL (ref 45–160)
TIBC: 394 mcg/dL (calc) (ref 250–450)

## 2018-12-16 ENCOUNTER — Other Ambulatory Visit: Payer: Self-pay | Admitting: *Deleted

## 2018-12-16 DIAGNOSIS — N179 Acute kidney failure, unspecified: Secondary | ICD-10-CM

## 2018-12-22 ENCOUNTER — Ambulatory Visit (HOSPITAL_COMMUNITY)
Admission: RE | Admit: 2018-12-22 | Discharge: 2018-12-22 | Disposition: A | Discharge status: Home / Self Care | Payer: Medicare Other | Source: Ambulatory Visit | Attending: Family Medicine | Admitting: Family Medicine

## 2018-12-22 ENCOUNTER — Other Ambulatory Visit: Payer: Self-pay

## 2018-12-22 DIAGNOSIS — R0602 Shortness of breath: Secondary | ICD-10-CM | POA: Insufficient documentation

## 2018-12-22 DIAGNOSIS — R918 Other nonspecific abnormal finding of lung field: Secondary | ICD-10-CM | POA: Insufficient documentation

## 2018-12-23 ENCOUNTER — Other Ambulatory Visit: Payer: Medicare Other

## 2018-12-23 DIAGNOSIS — N179 Acute kidney failure, unspecified: Secondary | ICD-10-CM | POA: Diagnosis not present

## 2018-12-24 LAB — BASIC METABOLIC PANEL
BUN/Creatinine Ratio: 17 (calc) (ref 6–22)
BUN: 22 mg/dL (ref 7–25)
CO2: 23 mmol/L (ref 20–32)
Calcium: 9.4 mg/dL (ref 8.6–10.4)
Chloride: 106 mmol/L (ref 98–110)
Creat: 1.28 mg/dL — ABNORMAL HIGH (ref 0.50–0.99)
Glucose, Bld: 88 mg/dL (ref 65–99)
Potassium: 4.8 mmol/L (ref 3.5–5.3)
Sodium: 140 mmol/L (ref 135–146)

## 2018-12-24 LAB — EXTRA LAV TOP TUBE

## 2019-01-09 ENCOUNTER — Other Ambulatory Visit: Payer: Self-pay | Admitting: Family Medicine

## 2019-01-25 ENCOUNTER — Other Ambulatory Visit: Payer: Self-pay | Admitting: Family Medicine

## 2019-02-06 ENCOUNTER — Other Ambulatory Visit: Payer: Self-pay | Admitting: Family Medicine

## 2019-02-10 ENCOUNTER — Emergency Department (HOSPITAL_COMMUNITY): Payer: Medicare Other

## 2019-02-10 ENCOUNTER — Other Ambulatory Visit: Payer: Self-pay

## 2019-02-10 ENCOUNTER — Encounter (HOSPITAL_COMMUNITY): Payer: Self-pay

## 2019-02-10 ENCOUNTER — Emergency Department (HOSPITAL_COMMUNITY)
Admission: EM | Admit: 2019-02-10 | Discharge: 2019-02-10 | Disposition: A | Discharge status: Home / Self Care | Payer: Medicare Other | Attending: Emergency Medicine | Admitting: Emergency Medicine

## 2019-02-10 DIAGNOSIS — Y999 Unspecified external cause status: Secondary | ICD-10-CM | POA: Insufficient documentation

## 2019-02-10 DIAGNOSIS — Y92014 Private driveway to single-family (private) house as the place of occurrence of the external cause: Secondary | ICD-10-CM | POA: Insufficient documentation

## 2019-02-10 DIAGNOSIS — Z87891 Personal history of nicotine dependence: Secondary | ICD-10-CM | POA: Diagnosis not present

## 2019-02-10 DIAGNOSIS — S52512A Displaced fracture of left radial styloid process, initial encounter for closed fracture: Secondary | ICD-10-CM

## 2019-02-10 DIAGNOSIS — S6992XA Unspecified injury of left wrist, hand and finger(s), initial encounter: Secondary | ICD-10-CM | POA: Diagnosis present

## 2019-02-10 DIAGNOSIS — Z7984 Long term (current) use of oral hypoglycemic drugs: Secondary | ICD-10-CM | POA: Diagnosis not present

## 2019-02-10 DIAGNOSIS — I110 Hypertensive heart disease with heart failure: Secondary | ICD-10-CM | POA: Diagnosis not present

## 2019-02-10 DIAGNOSIS — E119 Type 2 diabetes mellitus without complications: Secondary | ICD-10-CM | POA: Insufficient documentation

## 2019-02-10 DIAGNOSIS — I5030 Unspecified diastolic (congestive) heart failure: Secondary | ICD-10-CM | POA: Insufficient documentation

## 2019-02-10 DIAGNOSIS — S0990XA Unspecified injury of head, initial encounter: Secondary | ICD-10-CM | POA: Diagnosis not present

## 2019-02-10 DIAGNOSIS — Z79899 Other long term (current) drug therapy: Secondary | ICD-10-CM | POA: Insufficient documentation

## 2019-02-10 DIAGNOSIS — M25561 Pain in right knee: Secondary | ICD-10-CM | POA: Diagnosis not present

## 2019-02-10 DIAGNOSIS — S52515A Nondisplaced fracture of left radial styloid process, initial encounter for closed fracture: Secondary | ICD-10-CM | POA: Diagnosis not present

## 2019-02-10 DIAGNOSIS — R51 Headache: Secondary | ICD-10-CM | POA: Diagnosis not present

## 2019-02-10 DIAGNOSIS — W19XXXA Unspecified fall, initial encounter: Secondary | ICD-10-CM

## 2019-02-10 DIAGNOSIS — Y9301 Activity, walking, marching and hiking: Secondary | ICD-10-CM | POA: Insufficient documentation

## 2019-02-10 DIAGNOSIS — W010XXA Fall on same level from slipping, tripping and stumbling without subsequent striking against object, initial encounter: Secondary | ICD-10-CM | POA: Diagnosis not present

## 2019-02-10 NOTE — ED Notes (Signed)
Pt returned from CT °

## 2019-02-10 NOTE — ED Provider Notes (Signed)
[facility] EMERGENCY DEPARTMENT Provider Note   CSN: [id number] Arrival date & time: 02/10/19  1131    History   Chief Complaint Chief Complaint  Patient presents with  . Fall    HPI Denise Meadows is a 68 y.o. female.     68 y.o female with a PMH of DM, HTN presents to the ED with a chief complaint of s/p mechanical fall x 1 hour prior to arrival. Patient reports she was walking down her driveway when she slipped on wet gravel, reports falling on her right knee and trying to catch her fall with her left hand, states she did hit her head on the concrete. She reports standing after the incident, was ambulatory to the ED. She endorses pain along her head, goose egg present, left wrist pain worse with movement, and right knee pain states felt as this was "buckling" while ambulating. She denies any LOC, vomiting, not currently on blood thinners.  Of note, patient reports a history of liver injury due to prior medication and does not take tylenol. Also, states her PCP advised her to stop taking ibuprofen as she was injuring her kidney.   The history is provided by the patient.  Fall This is a new problem. The current episode started 1 to 2 hours ago. Pertinent negatives include no chest pain, no abdominal pain and no shortness of breath.    Past Medical History:  Diagnosis Date  . Complication of anesthesia   . DDD (degenerative disc disease)   . Diabetes mellitus   . Elevated liver function tests    eval by dr Leonides Grills to fatty liver 2008  . GERD (gastroesophageal reflux disease)   . Hiatal hernia   . Hiatal hernia   . Hyperlipidemia   . Hypertension   . NASH (nonalcoholic steatohepatitis)   . Nephrolithiasis   . PONV (postoperative nausea and vomiting)    heel surgery  . Pulmonary nodule    no change-1995  . Rosacea   . Ulcer    gastric ulcer, antritis-egd 09/06/08  . Vitamin D deficiency     Patient Active Problem List   Diagnosis Date Noted  . Diastolic  dysfunction 71/12/2692  . Reduced libido 07/27/2018  . Obese 07/20/2018  . Osteopenia 05/24/2017  . HTN (hypertension) 08/29/2012  . HLD (hyperlipidemia) 08/29/2012  . Vitamin D deficiency 08/29/2012  . Diabetes mellitus type 2, uncomplicated ([facility]) 85/46/2703  . Rosacea 08/29/2012  . H/O nephrolithotomy with removal of calculi 08/29/2012  . GERD (gastroesophageal reflux disease) 01/18/2012  . Dysphagia 01/18/2012  . Encounter for screening colonoscopy 01/18/2012  . Neck mass 10/01/2010  . Insomnia 10/01/2010  . Hiatal hernia     Past Surgical History:  Procedure Laterality Date  . BREAST SURGERY     benign breast nodules 2005  . CHOLECYSTECTOMY    . COLONOSCOPY  2008   Dr Raford Pitcher  . ESOPHAGOGASTRODUODENOSCOPY    . FRACTURE SURGERY    . MALONEY DILATION  02/08/2012   Procedure: MALONEY DILATION;  Surgeon: Danie Binder, MD;  Location: [facility];  Service: Endoscopy;  Laterality: N/A;  . SAVORY DILATION  02/08/2012   Procedure: SAVORY DILATION;  Surgeon: Danie Binder, MD;  Location: [facility];  Service: Endoscopy;  Laterality: N/A;  . SPINE SURGERY       OB History   No obstetric history on file.      Home Medications    Prior to Admission medications   Medication Sig Start  Date End Date Taking? Authorizing Provider  calcium gluconate 500 MG tablet Take 1 tablet (500 mg total) 2 (two) times daily by mouth. 05/24/17  Yes Dixon, Lonie Peak, PA-C  Cholecalciferol (VITAMIN D) 2000 units tablet Take 1 tablet (2,000 Units total) by mouth daily. 10/07/16  Yes Dena Billet B, PA-C  diazepam (VALIUM) 2 MG tablet Take 1 tablet (2 mg total) by mouth every 6 (six) hours as needed for anxiety. 10/07/16  Yes Dena Billet B, PA-C  metFORMIN (GLUCOPHAGE) 1000 MG tablet TAKE 1 TABLET BY MOUTH TWO  TIMES DAILY WITH MEALS 04/27/18  Yes Susy Frizzle, MD  metoprolol succinate (TOPROL-XL) 25 MG 24 hr tablet TAKE 1 TABLET BY MOUTH  DAILY 01/09/19  Yes [place], Modena Nunnery, MD  Omega-3  Fatty Acids (FISH OIL) 1000 MG CAPS Take 1 capsule (1,000 mg total) by mouth 2 (two) times daily. 07/21/18  Yes [place], Modena Nunnery, MD  omeprazole (PRILOSEC) 20 MG capsule Take 1 capsule (20 mg total) by mouth daily. 06/10/17  Yes Dena Billet B, PA-C  pioglitazone (ACTOS) 30 MG tablet TAKE 1 TABLET BY MOUTH  DAILY 02/06/19  Yes [place], Modena Nunnery, MD  ramipril (ALTACE) 10 MG capsule TAKE 1 CAPSULE BY MOUTH  DAILY 01/09/19  Yes [place], Modena Nunnery, MD  simvastatin (ZOCOR) 20 MG tablet TAKE 1 TABLET BY MOUTH AT  BEDTIME 01/25/19  Yes [place], Modena Nunnery, MD  vitamin B-12 (CYANOCOBALAMIN) 100 MCG tablet Take 1 tablet (100 mcg total) by mouth daily. 07/21/18  Yes Ward, Modena Nunnery, MD  hydrochlorothiazide (HYDRODIURIL) 25 MG tablet Take 1 tablet (25 mg total) by mouth every morning. Patient not taking: Reported on 02/10/2019 09/27/18   Alycia Rossetti, MD  metroNIDAZOLE (METROGEL) 0.75 % gel Apply 1 application topically 2 (two) times daily. Patient not taking: Reported on 02/10/2019 10/07/16   Orlena Sheldon, PA-C    Family History Family History  Problem Relation Age of Onset  . Hyperlipidemia Mother   . COPD Father   . Colon cancer Maternal Aunt   . Colon cancer Maternal Grandfather   . Heart disease Neg Hx     Social History Social History   Tobacco Use  . Smoking status: Former Smoker    Types: Cigarettes    Quit date: 07/06/1978    Years since quitting: 40.6  . Smokeless tobacco: Never Used  . Tobacco comment: intermittent for a few months  Substance Use Topics  . Alcohol use: Yes    Comment: occasional mixed drink couple per mo  . Drug use: No     Allergies   Codeine and Penicillins   Review of Systems Review of Systems  Constitutional: Negative for chills and fever.  HENT: Negative for ear pain and sore throat.   Eyes: Negative for pain and visual disturbance.  Respiratory: Negative for cough and shortness of breath.   Cardiovascular: Negative for chest pain and palpitations.   Gastrointestinal: Negative for abdominal pain and vomiting.  Genitourinary: Negative for dysuria and hematuria.  Musculoskeletal: Positive for arthralgias, back pain and myalgias.  Skin: Negative for color change and rash.  Neurological: Negative for seizures and syncope.  All other systems reviewed and are negative.    Physical Exam Updated Vital Signs BP (!) 141/43   Pulse 83   Temp 97.9 F (36.6 C) (Oral)   Resp 18   Wt 87.5 kg   SpO2 100%   BMI 36.47 kg/m   Physical Exam Vitals signs and nursing note reviewed.  Constitutional:      General: She is not in acute distress.    Appearance: She is well-developed.  HENT:     Head: Normocephalic. No raccoon eyes, Battle's sign, right periorbital erythema or left periorbital erythema.     Jaw: No trismus or swelling.      Mouth/Throat:     Pharynx: No oropharyngeal exudate.  Eyes:     Pupils: Pupils are equal, round, and reactive to light.  Neck:     Musculoskeletal: Normal range of motion.  Cardiovascular:     Rate and Rhythm: Regular rhythm.     Heart sounds: Normal heart sounds.  Pulmonary:     Effort: Pulmonary effort is normal. No respiratory distress.     Breath sounds: Normal breath sounds.     Comments: Lungs are clear to auscultation.  Abdominal:     General: Bowel sounds are normal. There is no distension.     Palpations: Abdomen is soft.     Tenderness: There is no abdominal tenderness.     Comments: No abdominal tenderness on exam.   Musculoskeletal:        General: No deformity.     Left wrist: She exhibits tenderness, bony tenderness and swelling. She exhibits no effusion, no crepitus, no deformity and no laceration.     Right lower leg: No edema.     Left lower leg: No edema.     Comments: Pulses present, capillary refill intact. Neuro intact.   Skin:    General: Skin is warm and dry.  Neurological:     Mental Status: She is alert and oriented to person, place, and time.     Comments: Alert,  oriented, thought content appropriate. Speech fluent without evidence of aphasia. Able to follow 2 step commands without difficulty.  Cranial Nerves:  II:  Peripheral visual fields grossly normal, pupils, round, reactive to light III,IV, VI: ptosis not present, extra-ocular motions intact bilaterally  V,VII: smile symmetric, facial light touch sensation equal VIII: hearing grossly normal bilaterally  IX,X: midline uvula rise  XI: bilateral shoulder shrug equal and strong XII: midline tongue extension  Motor:  5/5 in upper and lower extremities bilaterally including strong and equal grip strength and dorsiflexion/plantar flexion Sensory: light touch normal in all extremities.  Cerebellar: normal finger-to-nose with bilateral upper extremities, pronator drift negative        ED Treatments / Results  Labs (all labs ordered are listed, but only abnormal results are displayed) Labs Reviewed - No data to display  EKG None  Radiology Dg Wrist Complete Left  Result Date: 02/10/2019 CLINICAL DATA:  Left wrist pain after fall today. Initial encounter. EXAM: LEFT WRIST - COMPLETE 3+ VIEW COMPARISON:  None. FINDINGS: The patient has a nondisplaced fracture through the radial styloid. No other acute bony or joint abnormality is seen. 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 IMPRESSION: Nondisplaced fracture of the radial styloid. Electronically Signed   By: Inge Rise M.D.   On: 02/10/2019 12:18   Ct Head Wo Contrast  Result Date: 02/10/2019 CLINICAL DATA:  Fall, head trauma, injury EXAM: CT HEAD WITHOUT CONTRAST TECHNIQUE: Contiguous axial images were obtained from the base of the skull through the vertex without intravenous contrast. COMPARISON:  None. FINDINGS: Brain: No evidence of acute infarction, hemorrhage, hydrocephalus, extra-axial collection or mass lesion/mass effect. Vascular: No hyperdense vessel or unexpected calcification. Skull: Normal.  Negative for fracture or focal lesion. Sinuses/Orbits: No acute finding. Other: None. IMPRESSION: Normal head CT without  contrast for age Electronically Signed   By: Jerilynn Mages.  Shick M.D.   On: 02/10/2019 13:50   Dg Knee Complete 4 Views Right  Result Date: 02/10/2019 CLINICAL DATA:  Fall, medial right knee pain EXAM: RIGHT KNEE - COMPLETE 4+ VIEW COMPARISON:  None. FINDINGS: No acute fracture or malalignment. Minimal medial compartment degenerative changes. Chondrocalcinosis of the menisci. No knee joint effusion. IMPRESSION: No acute osseous abnormality, right knee. Electronically Signed   By: Davina Poke M.D.   On: 02/10/2019 13:20    Procedures Procedures (including critical care time)  Medications Ordered in ED Medications - No data to display   Initial Impression / Assessment and Plan / ED Course  I have reviewed the triage vital signs and the nursing notes.  Pertinent labs & imaging results that were available during my care of the patient were reviewed by me and considered in my medical decision making (see chart for details).       Patient with a PMH of DM, HTN presents with s/p mechanical fall. Reports pain along the left wrist, right knee and back of her head. Did strike her head without LOC, not on blood thinners. However, due to mechanism of fall and [place] injury rule cannot rule out intracranial pathology, will obtain CT head to further evaluate. Patient endorses left wrist pain with some swelling and pain noted, neurovascularly intact will obtain xray along with further evaluation of her right knee.  Left wrist xray showed: Nondisplaced fracture of the radial styloid.  Patient will be placed on thumb spica splint along with follow up with Dr. Ky Barban.  CT head obtained although exam is reassuring due to age and mechanism of fall.CT Head was negative for any subdural, hemorrhage or other acute process. An xray of her right knee was also obtained as she reported buckling  of her right knee, this showed no fracture, dislocation or effusion.  Patient states she is unable to take ibuprofen or tylenol due to prior injury to both organs, states she does not want pain medication and would rather ice injuries. Will have patient follow up with orthopedics along with PCP in 1 week. Patient with stable vitals signs, no dizziness, headache, chest pain stable for discharge home. Return precautions provided.   Portions of this note were generated with [employer]. Dictation errors may occur despite best attempts at proofreading.  Final Clinical Impressions(s) / ED Diagnoses   Final diagnoses:  Fall, initial encounter  Closed displaced fracture of styloid process of left radius, initial encounter    ED Discharge Orders    None       Janeece Fitting, PA-C 02/10/19 1451    Fredia Sorrow, MD 02/19/19 [phone]

## 2019-02-10 NOTE — ED Notes (Signed)
EDP at bedside  

## 2019-02-10 NOTE — Discharge Instructions (Signed)
Your left wrist was placed on a splint, please keep this clean and dry.   Please follow up with Orthopedics, Dr. Arther Abbott contact information is attached to your paperwork.  You may keep your right knee elevated with heat applied to the area.  If you experience any vomiting, headache or worsening symptoms please return to the ED.

## 2019-02-10 NOTE — ED Provider Notes (Signed)
Medical screening examination/treatment/procedure(s) were conducted as a shared visit with non-physician practitioner(s) and myself.  I personally evaluated the patient during the encounter.     Results for orders placed or performed in visit on 97/67/34  Basic metabolic panel  Result Value Ref Range   Glucose, Bld 88 65 - 99 mg/dL   BUN 22 7 - 25 mg/dL   Creat 1.28 (H) 0.50 - 0.99 mg/dL   BUN/Creatinine Ratio 17 6 - 22 (calc)   Sodium 140 135 - 146 mmol/L   Potassium 4.8 3.5 - 5.3 mmol/L   Chloride 106 98 - 110 mmol/L   CO2 23 20 - 32 mmol/L   Calcium 9.4 8.6 - 10.4 mg/dL  EXTRA LAV TOP TUBE  Result Value Ref Range   EXTRA LAVENDER-TOP TUBE     Dg Wrist Complete Left  Result Date: 02/10/2019 CLINICAL DATA:  Left wrist pain after fall today. Initial encounter. EXAM: LEFT WRIST - COMPLETE 3+ VIEW COMPARISON:  None. FINDINGS: The patient has a nondisplaced fracture through the radial styloid. No other acute bony or joint abnormality is seen. 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 IMPRESSION: Nondisplaced fracture of the radial styloid. Electronically Signed   By: Inge Rise M.D.   On: 02/10/2019 12:18   Ct Head Wo Contrast  Result Date: 02/10/2019 CLINICAL DATA:  Fall, head trauma, injury EXAM: CT HEAD WITHOUT CONTRAST TECHNIQUE: Contiguous axial images were obtained from the base of the skull through the vertex without intravenous contrast. COMPARISON:  None. FINDINGS: Brain: No evidence of acute infarction, hemorrhage, hydrocephalus, extra-axial collection or mass lesion/mass effect. Vascular: No hyperdense vessel or unexpected calcification. Skull: Normal. Negative for fracture or focal lesion. Sinuses/Orbits: No acute finding. Other: None. IMPRESSION: Normal head CT without contrast for age Electronically Signed   By: Jerilynn Mages.  Shick M.D.   On: 02/10/2019 13:50   Dg Knee Complete 4 Views Right  Result Date: 02/10/2019 CLINICAL DATA:  Fall, medial right  knee pain EXAM: RIGHT KNEE - COMPLETE 4+ VIEW COMPARISON:  None. FINDINGS: No acute fracture or malalignment. Minimal medial compartment degenerative changes. Chondrocalcinosis of the menisci. No knee joint effusion. IMPRESSION: No acute osseous abnormality, right knee. Electronically Signed   By: Davina Poke M.D.   On: 02/10/2019 13:20    Patient with a fall walking down the driveway slipped on loose gravel hit the back of her head and injured her left wrist.  And right knee.  Patient had no loss of consciousness no neck pain no low back pain not on blood thinners.  CT head without any acute findings.  X-ray of the left wrist depicted a distal styloid radius fracture which will be splinted and patient will be referred for follow-up with Dr. Aline Brochure from orthopedics.  X-rays of the right knee showed no acute injuries.  Patient seen by me along with physician assistant.    Fredia Sorrow, MD 02/10/19 [id number]

## 2019-02-10 NOTE — ED Notes (Signed)
Patient transported to X-ray 

## 2019-02-10 NOTE — ED Triage Notes (Signed)
Pt reports that she was walking down her driveway and slid on loose gravel. She hit the back of her head, left wrist swollen and right knee buckling. NO loc and no blood thinners

## 2019-02-13 ENCOUNTER — Other Ambulatory Visit: Payer: Self-pay

## 2019-02-13 ENCOUNTER — Ambulatory Visit (INDEPENDENT_AMBULATORY_CARE_PROVIDER_SITE_OTHER): Payer: Medicare Other | Admitting: Orthopedic Surgery

## 2019-02-13 VITALS — BP 120/55 | HR 61 | Temp 96.9°F | Ht 60.0 in | Wt 189.0 lb

## 2019-02-13 DIAGNOSIS — S52515A Nondisplaced fracture of left radial styloid process, initial encounter for closed fracture: Secondary | ICD-10-CM | POA: Diagnosis not present

## 2019-02-13 NOTE — Progress Notes (Signed)
Denise Meadows  02/13/2019  HISTORY SECTION :  Chief Complaint  Patient presents with  . Wrist Injury    ER follow up on left wrist fracture, DOI 02-10-19.   HPI The patient presents for evaluation of left wrist fracture  Location left wrist Duration 3 days Quality dull ache Severity mild, 1-3 Associated with swelling  Review of Systems  Respiratory: Positive for shortness of breath.   Cardiovascular: Positive for claudication.  Gastrointestinal: Positive for heartburn.  Genitourinary: Positive for frequency.  Musculoskeletal: Positive for back pain and neck pain.     Past Medical History:  Diagnosis Date  . Complication of anesthesia   . DDD (degenerative disc disease)   . Diabetes mellitus   . Elevated liver function tests    eval by dr Leonides Grills to fatty liver 2008  . GERD (gastroesophageal reflux disease)   . Hiatal hernia   . Hiatal hernia   . Hyperlipidemia   . Hypertension   . NASH (nonalcoholic steatohepatitis)   . Nephrolithiasis   . PONV (postoperative nausea and vomiting)    heel surgery  . Pulmonary nodule    no change-1995  . Rosacea   . Ulcer    gastric ulcer, antritis-egd 09/06/08  . Vitamin D deficiency     Past Surgical History:  Procedure Laterality Date  . BREAST SURGERY     benign breast nodules 2005  . CHOLECYSTECTOMY    . COLONOSCOPY  2008   Dr Raford Pitcher  . ESOPHAGOGASTRODUODENOSCOPY    . FRACTURE SURGERY    . MALONEY DILATION  02/08/2012   Procedure: MALONEY DILATION;  Surgeon: Danie Binder, MD;  [facility];  Service: Endoscopy;  Laterality: N/A;  . SAVORY DILATION  02/08/2012   Procedure: SAVORY DILATION;  Surgeon: Danie Binder, MD;  [facility]: [facility];  Service: Endoscopy;  Laterality: N/A;  . SPINE SURGERY       Allergies  Allergen Reactions  . Codeine Nausea And Vomiting  . Penicillins Rash     Current Outpatient Medications:  .  calcium gluconate 500 MG tablet, Take 1 tablet (500 mg  total) 2 (two) times daily by mouth., Disp: 60 tablet, Rfl: 11 .  Cholecalciferol (VITAMIN D) 2000 units tablet, Take 1 tablet (2,000 Units total) by mouth daily., Disp: 90 tablet, Rfl: 2 .  diazepam (VALIUM) 2 MG tablet, Take 1 tablet (2 mg total) by mouth every 6 (six) hours as needed for anxiety., Disp: 30 tablet, Rfl: 0 .  metFORMIN (GLUCOPHAGE) 1000 MG tablet, TAKE 1 TABLET BY MOUTH TWO  TIMES DAILY WITH MEALS, Disp: 180 tablet, Rfl: 0 .  metoprolol succinate (TOPROL-XL) 25 MG 24 hr tablet, TAKE 1 TABLET BY MOUTH  DAILY, Disp: 90 tablet, Rfl: 0 .  metroNIDAZOLE (METROGEL) 0.75 % gel, Apply 1 application topically 2 (two) times daily., Disp: 45 g, Rfl: 5 .  Omega-3 Fatty Acids (FISH OIL) 1000 MG CAPS, Take 1 capsule (1,000 mg total) by mouth 2 (two) times daily., Disp: , Rfl: 0 .  omeprazole (PRILOSEC) 20 MG capsule, Take 1 capsule (20 mg total) by mouth daily., Disp: 90 capsule, Rfl: 2 .  pioglitazone (ACTOS) 30 MG tablet, TAKE 1 TABLET BY MOUTH  DAILY, Disp: 90 tablet, Rfl: 3 .  ramipril (ALTACE) 10 MG capsule, TAKE 1 CAPSULE BY MOUTH  DAILY, Disp: 90 capsule, Rfl: 0 .  simvastatin (ZOCOR) 20 MG tablet, TAKE 1 TABLET BY MOUTH AT  BEDTIME, Disp: 90 tablet, Rfl: 1 .  vitamin  B-12 (CYANOCOBALAMIN) 100 MCG tablet, Take 1 tablet (100 mcg total) by mouth daily., Disp: , Rfl:  .  hydrochlorothiazide (HYDRODIURIL) 25 MG tablet, Take 1 tablet (25 mg total) by mouth every morning. (Patient not taking: Reported on 02/10/2019), Disp: 90 tablet, Rfl: 3   PHYSICAL EXAM SECTION: 1) BP (!) 120/55   Pulse 61   Temp (!) 96.9 F (36.1 C)   Ht 5' (1.524 m)   Wt 189 lb (85.7 kg)   BMI 36.91 kg/m   Body mass index is 36.91 kg/m. General appearance: Well-developed well-nourished no gross deformities  2) Cardiovascular normal pulse and perfusion normal color with no edema right and left upper extremity 3) Neurologically deep tendon reflexes are equal and normal, no sensation loss or deficits no pathologic  reflexes  4) Psychological: Awake alert and oriented x3 mood and affect normal  5) Skin no lacerations or ulcerations no nodularity no palpable masses, no erythema or nodularity  6) Musculoskeletal:   Left wrist tenderness radial styloid swelling of the wrist alignment normal painful range of motion limited stability deferred but x-ray shows no instability muscle tone normal skin ecchymotic   MEDICAL DECISION SECTION:  Encounter Diagnosis  Name Primary?  . Closed nondisplaced fracture of styloid process of left radius, initial encounter Yes   Cpt 25600  Imaging Outside images 3 views of the left wrist nondisplaced radial styloid fracture is seen  Report is read as nondisplaced radial styloid fracture  Plan:  (Rx., Inj., surg., Frx, MRI/CT, XR:2)  Immobilization for 6 weeks CUS   X-rays in 6 weeks  9:54 AM Arther Abbott, MD  02/13/2019

## 2019-03-01 NOTE — Progress Notes (Signed)
Cardiology Office Note:    Date:  03/02/2019   ID:  Denise Meadows, DOB [birth year], MRN [id number]  PCP:  Alycia Rossetti, MD  Cardiologist:  No primary care provider on file. Denise Meadows patient Electrophysiologist:  None   Referring MD: Alycia Rossetti, MD   Chief complain: DOE  History of Present Illness:    Denise Meadows is a 68 y.o. female with a hx of HTN, HLP, previously very active, now with new DOE when walking uphill or stairs. Used to walk 2.5 miles/day. No chest pain. BP controlled, occasional LE edema controlled with PRN HCTZ. No falls, syncope. Complaint with meds, tolerates simvastatin well.   Past Medical History:  Diagnosis Date  . Complication of anesthesia   . DDD (degenerative disc disease)   . Diabetes mellitus   . Elevated liver function tests    eval by dr Leonides Grills to fatty liver 2008  . GERD (gastroesophageal reflux disease)   . Hiatal hernia   . Hiatal hernia   . Hyperlipidemia   . Hypertension   . NASH (nonalcoholic steatohepatitis)   . Nephrolithiasis   . PONV (postoperative nausea and vomiting)    heel surgery  . Pulmonary nodule    no change-1995  . Rosacea   . Ulcer    gastric ulcer, antritis-egd 09/06/08  . Vitamin D deficiency     Past Surgical History:  Procedure Laterality Date  . BREAST SURGERY     benign breast nodules 2005  . CHOLECYSTECTOMY    . COLONOSCOPY  2008   Dr Raford Pitcher  . ESOPHAGOGASTRODUODENOSCOPY    . FRACTURE SURGERY    . MALONEY DILATION  02/08/2012   Procedure: MALONEY DILATION;  Surgeon: Danie Binder, MD;  [facility];  Service: Endoscopy;  Laterality: N/A;  . SAVORY DILATION  02/08/2012   Procedure: SAVORY DILATION;  Surgeon: Danie Binder, MD;  [facility];  Service: Endoscopy;  Laterality: N/A;  . SPINE SURGERY      Current Medications: Current Meds  Medication Sig  . calcium gluconate 500 MG tablet Take 1 tablet (500 mg total) 2 (two) times daily by mouth.  .  Cholecalciferol (VITAMIN D) 2000 units tablet Take 1 tablet (2,000 Units total) by mouth daily.  . diazepam (VALIUM) 2 MG tablet Take 1 tablet (2 mg total) by mouth every 6 (six) hours as needed for anxiety.  . metFORMIN (GLUCOPHAGE) 1000 MG tablet TAKE 1 TABLET BY MOUTH TWO  TIMES DAILY WITH MEALS  . metoprolol succinate (TOPROL-XL) 25 MG 24 hr tablet TAKE 1 TABLET BY MOUTH  DAILY  . metroNIDAZOLE (METROGEL) 0.75 % gel Apply 1 application topically as needed.  . Omega-3 Fatty Acids (FISH OIL) 1000 MG CAPS Take 1 capsule (1,000 mg total) by mouth 2 (two) times daily.  Marland Kitchen omeprazole (PRILOSEC) 20 MG capsule Take 1 capsule (20 mg total) by mouth daily.  . pioglitazone (ACTOS) 30 MG tablet TAKE 1 TABLET BY MOUTH  DAILY  . ramipril (ALTACE) 10 MG capsule TAKE 1 CAPSULE BY MOUTH  DAILY  . simvastatin (ZOCOR) 20 MG tablet TAKE 1 TABLET BY MOUTH AT  BEDTIME  . vitamin B-12 (CYANOCOBALAMIN) 100 MCG tablet Take 1 tablet (100 mcg total) by mouth daily.     Allergies:   Codeine and Penicillins   Social History   Socioeconomic History  . Marital status: Married    Spouse name: Not on file  . Number of children: 2  . Years of education:  Not on file  . Highest education level: Not on file  Occupational History  . Occupation: homemaker  Social Needs  . Financial resource strain: Not on file  . Food insecurity    Worry: Not on file    Inability: Not on file  . Transportation needs    Medical: Not on file    Non-medical: Not on file  Tobacco Use  . Smoking status: Former Smoker    Types: Cigarettes    Quit date: 07/06/1978    Years since quitting: 40.6  . Smokeless tobacco: Never Used  . Tobacco comment: intermittent for a few months  Substance and Sexual Activity  . Alcohol use: Yes    Comment: occasional mixed drink couple per mo  . Drug use: No  . Sexual activity: Not on file  Lifestyle  . Physical activity    Days per week: Not on file    Minutes per session: Not on file  . Stress:  Not on file  Relationships  . Social Herbalist on phone: Not on file    Gets together: Not on file    Attends religious service: Not on file    Active member of club or organization: Not on file    Attends meetings of clubs or organizations: Not on file    Relationship status: Not on file  Other Topics Concern  . Not on file  Social History Narrative   Lives w/ husband     Family History: The patient's family history includes COPD in her father; Colon cancer in her maternal aunt and maternal grandfather; Hyperlipidemia in her mother. There is no history of Heart disease.  ROS:   Please see the history of present illness.    All other systems reviewed and are negative.  EKGs/Labs/Other Studies Reviewed:    The following studies were reviewed today:  EKG:  EKG is ordered today.  The ekg ordered today demonstrates SR, iRBBB  TTE: 07/26/2018  - Left ventricle: The cavity size was normal. Systolic function was   normal. The estimated ejection fraction was in the range of 60%   to 65%. Wall motion was normal; there were no regional wall   motion abnormalities. Features are consistent with a pseudonormal   left ventricular filling pattern, with concomitant abnormal   relaxation and increased filling pressure (grade 2 diastolic   dysfunction). Doppler parameters are consistent with   indeterminate ventricular filling pressure. - Aortic valve: Transvalvular velocity was within the normal range.   There was no stenosis. There was no regurgitation. - Mitral valve: Transvalvular velocity was within the normal range.   There was no evidence for stenosis. There was trivial   regurgitation. - Right ventricle: The cavity size was normal. Wall thickness was   normal. Systolic function was normal. - Atrial septum: No defect or patent foramen ovale was identified   by color flow Doppler. - Tricuspid valve: There was mild regurgitation. - Pulmonary arteries: Systolic pressure  was within the normal   range. PA peak pressure: 25 mm Hg (S).  Recent Labs: 07/20/2018: TSH 2.69 11/30/2018: ALT 13 12/14/2018: Hemoglobin 11.5; Platelets 246 12/23/2018: BUN 22; Creat 1.28; Potassium 4.8; Sodium 140  Recent Lipid Panel    Component Value Date/Time   CHOL 167 11/30/2018 0929   TRIG 213 (H) 11/30/2018 0929   HDL 43 (L) 11/30/2018 0929   CHOLHDL 3.9 11/30/2018 0929   VLDL 35 (H) 10/07/2016 0846   LDLCALC 93 11/30/2018 0929  Physical Exam:    VS:  BP 122/68   Pulse 69   Ht 5' (1.524 m)   Wt 194 lb (88 kg)   SpO2 96%   BMI 37.89 kg/m     Wt Readings from Last 3 Encounters:  03/02/19 194 lb (88 kg)  02/13/19 189 lb (85.7 kg)  02/10/19 193 lb (87.5 kg)     GEN:  Well nourished, well developed in no acute distress HEENT: Normal NECK: No JVD; No carotid bruits LYMPHATICS: No lymphadenopathy CARDIAC: RRR, no murmurs, rubs, gallops RESPIRATORY:  Clear to auscultation without rales, wheezing or rhonchi  ABDOMEN: Soft, non-tender, non-distended MUSCULOSKELETAL:  No edema; No deformity  SKIN: Warm and dry NEUROLOGIC:  Alert and oriented x 3 PSYCHIATRIC:  Normal affect   ASSESSMENT:    1. DOE (dyspnea on exertion)   2. Precordial pain   3. Essential hypertension   4. Hyperlipidemia, unspecified hyperlipidemia type   5. Coronary artery calcification seen on CT scan    PLAN:    In order of problems listed above:  1. DOE - Risk factors include HTN, HLP, DM, I have reviewed her chest CTA and she has evidence of coronary calcifications in LAD, and RCA. I will obtain coronary CTA to further evaluate. If non-obstructive she is advised to start walking 5x/week. We will consider adding ASA 81 mg po daily (prior GI ulcer and bleeding). 2. Hypertension - controlled 3. HLP - elevated TG, consider adding fish oild based on CT results   Follow up in 6 months.  Medication Adjustments/Labs and Tests Ordered: Current medicines are reviewed at length with the patient  today.  Concerns regarding medicines are outlined above.  Orders Placed This Encounter  Procedures  . CT CORONARY MORPH W/CTA COR W/SCORE W/CA W/CM &/OR WO/CM  . CT CORONARY FRACTIONAL FLOW RESERVE DATA PREP  . CT CORONARY FRACTIONAL FLOW RESERVE FLUID ANALYSIS  . Basic metabolic panel  . EKG 12-Lead   No orders of the defined types were placed in this encounter.   There are no Patient Instructions on file for this visit.   Signed, Ena Dawley, MD  03/02/2019 9:28 AM    [place]

## 2019-03-02 ENCOUNTER — Ambulatory Visit (INDEPENDENT_AMBULATORY_CARE_PROVIDER_SITE_OTHER): Payer: Medicare Other | Admitting: Cardiology

## 2019-03-02 ENCOUNTER — Other Ambulatory Visit: Payer: Self-pay

## 2019-03-02 ENCOUNTER — Encounter: Payer: Self-pay | Admitting: Cardiology

## 2019-03-02 VITALS — BP 122/68 | HR 69 | Ht 60.0 in | Wt 194.0 lb

## 2019-03-02 DIAGNOSIS — I10 Essential (primary) hypertension: Secondary | ICD-10-CM

## 2019-03-02 DIAGNOSIS — R0600 Dyspnea, unspecified: Secondary | ICD-10-CM

## 2019-03-02 DIAGNOSIS — I2510 Atherosclerotic heart disease of native coronary artery without angina pectoris: Secondary | ICD-10-CM | POA: Diagnosis not present

## 2019-03-02 DIAGNOSIS — R072 Precordial pain: Secondary | ICD-10-CM

## 2019-03-02 DIAGNOSIS — E785 Hyperlipidemia, unspecified: Secondary | ICD-10-CM | POA: Diagnosis not present

## 2019-03-02 DIAGNOSIS — R0609 Other forms of dyspnea: Secondary | ICD-10-CM | POA: Diagnosis not present

## 2019-03-02 NOTE — Patient Instructions (Signed)
Medication Instructions:   Your physician recommends that you continue on your current medications as directed. Please refer to the Current Medication list given to you today.  If you need a refill on your cardiac medications before your next appointment, please call your pharmacy.    Lab work:  BMET TO BE DONE IN THE NEAR FUTURE PRIOR TO WHEN YOUR CORONARY CT IS SCHEDULED PER PROTOCOL.  If you have labs (blood work) drawn today and your tests are completely normal, you will receive your results only by: Marland Kitchen MyChart Message (if you have MyChart) OR . A paper copy in the mail If you have any lab test that is abnormal or we need to change your treatment, we will call you to review the results.    Testing/Procedures:  CORONARY CT Your cardiac CT will be scheduled at one of the below locations:   [facility] [address] [place], [place] [address] [phone]   Please arrive at the [facility] main entrance of [facility] 30-45 minutes prior to test start time. Proceed to the [facility] Radiology Department (first floor) to check-in and test prep.  Please follow these instructions carefully (unless otherwise directed):   On the Night Before the Test: . Be sure to Drink plenty of water. . Do not consume any caffeinated/decaffeinated beverages or chocolate 12 hours prior to your test. . Do not take any antihistamines 12 hours prior to your test. . If you take Metformin and Actos do not take 24 hours prior to test.   On the Day of the Test: . Drink plenty of water. Do not drink any water within one hour of the test. . Do not eat any food 4 hours prior to the test. . You may take your regular medications prior to the test.   . FEMALES- please wear underwire-free bra if available       After the Test: . Drink plenty of water. . After receiving IV contrast, you may experience a mild flushed feeling. This is normal. . On occasion, you may experience a  mild rash up to 24 hours after the test. This is not dangerous. If this occurs, you can take Benadryl 25 mg and increase your fluid intake. . If you experience trouble breathing, this can be serious. If it is severe call 911 IMMEDIATELY. If it is mild, please call our office. . If you take any of these medications: Glipizide/Metformin, and Actos, please do not take 48 hours after completing test.   Please contact the cardiac imaging nurse navigator should you have any questions/concerns Marchia Bond, RN Navigator Cardiac Imaging [facility] [phone] Office  [phone] Cell     Follow-Up: At [facility], you and your health needs are our priority.  As part of our continuing mission to provide you with exceptional heart care, we have created designated Provider Care Teams.  These Care Teams include your primary Cardiologist (physician) and Advanced Practice Providers (APPs -  Physician Assistants and Nurse Practitioners) who all work together to provide you with the care you need, when you need it. You will need a follow up appointment in 6 months with Dr. Meda Coffee.  Please call our office 2 months in advance to schedule this appointment.  You may see  or one of the following Advanced Practice Providers on your designated Care Team:   Lyda Jester, PA-C Melina Copa, PA-C . Ermalinda Barrios, PA-C

## 2019-03-07 ENCOUNTER — Other Ambulatory Visit: Payer: Self-pay | Admitting: Family Medicine

## 2019-03-24 LAB — HM DIABETES EYE EXAM

## 2019-03-27 ENCOUNTER — Telehealth (HOSPITAL_COMMUNITY): Payer: Self-pay | Admitting: Emergency Medicine

## 2019-03-27 ENCOUNTER — Other Ambulatory Visit: Payer: Self-pay | Admitting: Family Medicine

## 2019-03-27 ENCOUNTER — Ambulatory Visit: Payer: Medicare Other

## 2019-03-27 ENCOUNTER — Other Ambulatory Visit: Payer: Medicare Other | Admitting: *Deleted

## 2019-03-27 ENCOUNTER — Encounter: Payer: Self-pay | Admitting: Orthopedic Surgery

## 2019-03-27 ENCOUNTER — Other Ambulatory Visit: Payer: Self-pay

## 2019-03-27 ENCOUNTER — Ambulatory Visit (INDEPENDENT_AMBULATORY_CARE_PROVIDER_SITE_OTHER): Payer: Medicare Other | Admitting: Orthopedic Surgery

## 2019-03-27 DIAGNOSIS — E785 Hyperlipidemia, unspecified: Secondary | ICD-10-CM

## 2019-03-27 DIAGNOSIS — I10 Essential (primary) hypertension: Secondary | ICD-10-CM | POA: Diagnosis not present

## 2019-03-27 DIAGNOSIS — R0609 Other forms of dyspnea: Secondary | ICD-10-CM

## 2019-03-27 DIAGNOSIS — R072 Precordial pain: Secondary | ICD-10-CM | POA: Diagnosis not present

## 2019-03-27 DIAGNOSIS — S52515D Nondisplaced fracture of left radial styloid process, subsequent encounter for closed fracture with routine healing: Secondary | ICD-10-CM

## 2019-03-27 DIAGNOSIS — I2510 Atherosclerotic heart disease of native coronary artery without angina pectoris: Secondary | ICD-10-CM

## 2019-03-27 DIAGNOSIS — R0600 Dyspnea, unspecified: Secondary | ICD-10-CM

## 2019-03-27 LAB — BASIC METABOLIC PANEL
BUN/Creatinine Ratio: 24 (ref 12–28)
BUN: 27 mg/dL (ref 8–27)
CO2: 24 mmol/L (ref 20–29)
Calcium: 10.3 mg/dL (ref 8.7–10.3)
Chloride: 102 mmol/L (ref 96–106)
Creatinine, Ser: 1.14 mg/dL — ABNORMAL HIGH (ref 0.57–1.00)
GFR calc Af Amer: 57 mL/min/{1.73_m2} — ABNORMAL LOW (ref >59)
GFR calc non Af Amer: 50 mL/min/{1.73_m2} — ABNORMAL LOW (ref >59)
Glucose: 125 mg/dL — ABNORMAL HIGH (ref 65–99)
Potassium: 4.7 mmol/L (ref 3.5–5.2)
Sodium: 139 mmol/L (ref 134–144)

## 2019-03-27 NOTE — Patient Instructions (Signed)
Brace weaning  8 hrs this week 4 hrs next week Then remove

## 2019-03-27 NOTE — Progress Notes (Signed)
Chief Complaint  Patient presents with  . Wrist Injury    02/10/2019 left wrist fracture/ improving    FRACTURE CARE   MILD TENDERNESS LEFT WRIST   XRAYS HEALED NICELY   DC

## 2019-03-27 NOTE — Telephone Encounter (Signed)
Pt returning phone call regarding upcoming cardiac imaging study; pt verbalizes understanding of appt date/time, parking situation and where to check in, pre-test NPO status and medications ordered, and verified current allergies; name and call back number provided for further questions should they arise Trenden Hazelrigg RN Navigator Cardiac Imaging [facility] [phone] office [phone] cell   

## 2019-03-27 NOTE — Telephone Encounter (Signed)
Left message on voicemail with name and callback number Torsten Weniger RN Navigator Cardiac Imaging [facility] [phone] Office [phone] Cell  

## 2019-03-29 ENCOUNTER — Encounter (HOSPITAL_COMMUNITY): Payer: Self-pay

## 2019-03-29 ENCOUNTER — Ambulatory Visit (HOSPITAL_COMMUNITY)
Admission: RE | Admit: 2019-03-29 | Discharge: 2019-03-29 | Disposition: A | Discharge status: Home / Self Care | Payer: Medicare Other | Source: Ambulatory Visit | Attending: Cardiology | Admitting: Cardiology

## 2019-03-29 ENCOUNTER — Other Ambulatory Visit: Payer: Self-pay

## 2019-03-29 DIAGNOSIS — R072 Precordial pain: Secondary | ICD-10-CM | POA: Insufficient documentation

## 2019-03-29 DIAGNOSIS — I10 Essential (primary) hypertension: Secondary | ICD-10-CM | POA: Diagnosis not present

## 2019-03-29 DIAGNOSIS — R0609 Other forms of dyspnea: Secondary | ICD-10-CM | POA: Diagnosis not present

## 2019-03-29 DIAGNOSIS — E785 Hyperlipidemia, unspecified: Secondary | ICD-10-CM | POA: Diagnosis not present

## 2019-03-29 DIAGNOSIS — I2510 Atherosclerotic heart disease of native coronary artery without angina pectoris: Secondary | ICD-10-CM | POA: Diagnosis not present

## 2019-03-29 DIAGNOSIS — Z006 Encounter for examination for normal comparison and control in clinical research program: Secondary | ICD-10-CM

## 2019-03-29 DIAGNOSIS — R0600 Dyspnea, unspecified: Secondary | ICD-10-CM

## 2019-03-29 MED ORDER — NITROGLYCERIN 0.4 MG SL SUBL
SUBLINGUAL_TABLET | SUBLINGUAL | Status: AC
  Filled 2019-03-29: qty 2

## 2019-03-29 MED ORDER — NITROGLYCERIN 0.4 MG SL SUBL
0.8000 mg | SUBLINGUAL_TABLET | Freq: Once | SUBLINGUAL | Status: AC
  Administered 2019-03-29: 0.8 mg via SUBLINGUAL
  Filled 2019-03-29: qty 25

## 2019-03-29 MED ORDER — IOHEXOL 350 MG/ML SOLN
80.0000 mL | Freq: Once | INTRAVENOUS | Status: AC | PRN
  Administered 2019-03-29: 80 mL via INTRAVENOUS

## 2019-03-29 NOTE — Progress Notes (Signed)
Pt tolerated exam without incident.  Caffeine beverage provided to patient after exam.  PIV removed and dressing applied.  Discharge instructions discussed, all questions answered

## 2019-03-29 NOTE — Research (Signed)
Cadfem Informed Consent    Patient Name: Denise Meadows    Subject met inclusion and exclusion criteria.  The informed consent form, study requirements and expectations were reviewed with the subject and questions and concerns were addressed prior to the signing of the consent form.  The subject verbalized understanding of the trail requirements.  The subject agreed to participate in the CADFEM trial and signed the informed consent.  The informed consent was obtained prior to performance of any protocol-specific procedures for the subject.  A copy of the signed informed consent was given to the subject and a copy was placed in the subject's medical record.   Neva Seat

## 2019-03-29 NOTE — Discharge Instructions (Signed)
Testing With IV Contrast Material °IV contrast material is a fluid that is used with some imaging tests. It is injected into your body through a vein. Contrast material is used when your health care providers need a detailed look at organs, tissues, or blood vessels that may not show up with the standard test. The material may be used when an X-ray, an MRI, a CT scan, or an ultrasound is done. °IV contrast material may be used for imaging tests that check: °· Muscles, skin, and fat. °· Breasts. °· Brain. °· Digestive tract. °· Heart. °· Organs such as the liver, kidneys, lungs, bladder, and many others. °· Arteries and veins. °Tell a health care provider about: °· Any allergies you have, especially an allergy to contrast material. °· All medicines you are taking, including metformin, beta blockers, NSAIDs (such as ibuprofen), interleukin-2, vitamins, herbs, eye drops, creams, and over-the-counter medicines. °· Any problems you or family members have had with the use of contrast material. °· Any blood disorders you have, such as sickle cell anemia. °· Any surgeries you have had. °· Any medical conditions you have or have had, especially alcohol abuse, dehydration, asthma, or kidney, liver, or heart problems. °· Whether you are pregnant or may be pregnant. °· Whether you are breastfeeding. Most contrast materials are safe for use in breastfeeding women. °What are the risks? °Generally, this is a safe procedure. However, problems may occur, including: °· Headache. °· Itching, skin rash, and hives. °· Nausea and vomiting. °· Allergic reactions. °· Wheezing or difficulty breathing. °· Abnormal heart rate. °· Changes in blood pressure. °· Throat swelling. °· Kidney damage. °What happens before the procedure? °Medicines °Ask your health care provider about: °· Changing or stopping your regular medicines. This is especially important if you are taking diabetes medicines or blood thinners. °· Taking medicines such as aspirin  and ibuprofen. These medicines can thin your blood. Do not take these medicines unless your health care provider tells you to take them. °· Taking over-the-counter medicines, vitamins, herbs, and supplements. °If you are at risk of having a reaction to the IV contrast material, you may be asked to take medicine before the procedure to prevent a reaction. °General instructions °· Follow instructions from your health care provider about eating or drinking restrictions. °· You may have an exam or lab tests to make sure that you can safely get IV contrast material. °· Ask if you will be given a medicine to help you relax (sedative) during the procedure. If so, plan to have someone take you home from the hospital or clinic. °What happens during the procedure? °· You may be given a sedative to help you relax. °· An IV will be inserted into one of your veins. °· Contrast material will be injected into your IV. °· You may feel warmth or flushing as the contrast material enters your bloodstream. °· You may have a metallic taste in your mouth for a few minutes. °· The needle may cause some discomfort and bruising. °· After the contrast material is in your body, the imaging test will be done. °The procedure may vary among health care providers and hospitals. °What can I expect after the procedure? °· The IV will be removed. °· You may be taken to a recovery area if sedation medicines were used. Your blood pressure, heart rate, breathing rate, and blood oxygen level will be monitored until you leave the hospital or clinic. °Follow these instructions at home: ° °· Take over-the-counter and   prescription medicines only as told by your health care provider. °? Your health care provider may tell you to not take certain medicines for a couple of days after the procedure. This is especially important if you are taking diabetes medicines. °· If you are told, drink enough fluid to keep your urine pale yellow. This will help to remove  the contrast material out of your body. °· Do not drive for 24 hours if you were given a sedative during your procedure. °· It is up to you to get the results of your procedure. Ask your health care provider, or the department that is doing the procedure, when your results will be ready. °· Keep all follow-up visits as told by your health care provider. This is important. °Contact a health care provider if: °· You have redness, swelling, or pain near your IV site. °Get help right away if: °· You have an abnormal heart rhythm. °· You have trouble breathing. °· You have: °? Chest pain. °? Pain in your back, neck, arm, jaw, or stomach. °? Nausea or sweating. °? Hives or a rash. °· You start shaking and cannot stop. °These symptoms may represent a serious problem that is an emergency. Do not wait to see if the symptoms will go away. Get medical help right away. Call your local emergency services (911 in the [place] Do not drive yourself to the hospital. °Summary °· IV contrast material may be used for imaging tests to help your health care providers see your organs and tissues more clearly. °· Tell your health care provider if you are pregnant or may be pregnant. °· During the procedure, you may feel warmth or flushing as the contrast material enters your bloodstream. °· After the procedure, drink enough fluid to keep your urine pale yellow. °This information is not intended to replace advice given to you by your health care provider. Make sure you discuss any questions you have with your health care provider. °Document Released: 06/10/2009 Document Revised: 09/08/2018 Document Reviewed: 09/08/2018 °Elsevier Patient Education © 2020 [employer] ° ° °Cardiac CT Angiogram ° °A cardiac CT angiogram is a procedure to look at the heart and the area around the heart. It may be done to help find the cause of chest pains or other symptoms of heart disease. During this procedure, a large X-ray machine, called a CT scanner,  takes detailed pictures of the heart and the surrounding area after a dye (contrast material) has been injected into blood vessels in the area. The procedure is also sometimes called a coronary CT angiogram, coronary artery scanning, or CTA. °A cardiac CT angiogram allows the health care provider to see how well blood is flowing to and from the heart. The health care provider will be able to see if there are any problems, such as: °· Blockage or narrowing of the coronary arteries in the heart. °· Fluid around the heart. °· Signs of weakness or disease in the muscles, valves, and tissues of the heart. °Tell a health care provider about: °· Any allergies you have. This is especially important if you have had a previous allergic reaction to contrast dye. °· All medicines you are taking, including vitamins, herbs, eye drops, creams, and over-the-counter medicines. °· Any blood disorders you have. °· Any surgeries you have had. °· Any medical conditions you have. °· Whether you are pregnant or may be pregnant. °· Any anxiety disorders, chronic pain, or other conditions you have that may increase your stress or prevent   you from lying still. °What are the risks? °Generally, this is a safe procedure. However, problems may occur, including: °· Bleeding. °· Infection. °· Allergic reactions to medicines or dyes. °· Damage to other structures or organs. °· Kidney damage from the dye or contrast that is used. °· Increased risk of cancer from radiation exposure. This risk is low. Talk with your health care provider about: °? The risks and benefits of testing. °? How you can receive the lowest dose of radiation. °What happens before the procedure? °· Wear comfortable clothing and remove any jewelry, glasses, dentures, and hearing aids. °· Follow instructions from your health care provider about eating and drinking. This may include: °? For 12 hours before the test -- avoid caffeine. This includes tea, coffee, soda, energy drinks,  and diet pills. Drink plenty of water or other fluids that do not have caffeine in them. Being well-hydrated can prevent complications. °? For 4-6 hours before the test -- stop eating and drinking. The contrast dye can cause nausea, but this is less likely if your stomach is empty. °· Ask your health care provider about changing or stopping your regular medicines. This is especially important if you are taking diabetes medicines, blood thinners, or medicines to treat erectile dysfunction. °What happens during the procedure? °· Hair on your chest may need to be removed so that small sticky patches called electrodes can be placed on your chest. These will transmit information that helps to monitor your heart during the test. °· An IV tube will be inserted into one of your veins. °· You might be given a medicine to control your heart rate during the test. This will help to ensure that good images are obtained. °· You will be asked to lie on an exam table. This table will slide in and out of the CT machine during the procedure. °· Contrast dye will be injected into the IV tube. You might feel warm, or you may get a metallic taste in your mouth. °· You will be given a medicine (nitroglycerin) to relax (dilate) the arteries in your heart. °· The table that you are lying on will move into the CT machine tunnel for the scan. °· The person running the machine will give you instructions while the scans are being done. You may be asked to: °? Keep your arms above your head. °? Hold your breath. °? Stay very still, even if the table is moving. °· When the scanning is complete, you will be moved out of the machine. °· The IV tube will be removed. °The procedure may vary among health care providers and hospitals. °What happens after the procedure? °· You might feel warm, or you may get a metallic taste in your mouth from the contrast dye. °· You may have a headache from the nitroglycerin. °· After the procedure, drink water or  other fluids to wash (flush) the contrast material out of your body. °· Contact a health care provider if you have any symptoms of allergy to the contrast. These symptoms include: °? Shortness of breath. °? Rash or hives. °? A racing heartbeat. °· Most people can return to their normal activities right after the procedure. Ask your health care provider what activities are safe for you. °· It is up to you to get the results of your procedure. Ask your health care provider, or the department that is doing the procedure, when your results will be ready. °Summary °· A cardiac CT angiogram is a procedure to   look at the heart and the area around the heart. It may be done to help find the cause of chest pains or other symptoms of heart disease. °· During this procedure, a large X-ray machine, called a CT scanner, takes detailed pictures of the heart and the surrounding area after a dye (contrast material) has been injected into blood vessels in the area. °· Ask your health care provider about changing or stopping your regular medicines before the procedure. This is especially important if you are taking diabetes medicines, blood thinners, or medicines to treat erectile dysfunction. °· After the procedure, drink water or other fluids to wash (flush) the contrast material out of your body. °This information is not intended to replace advice given to you by your health care provider. Make sure you discuss any questions you have with your health care provider. °Document Released: 06/04/2008 Document Revised: 06/04/2017 Document Reviewed: 05/11/2016 °Elsevier Patient Education © 2020 Else[employer] Inc. ° °

## 2019-04-04 ENCOUNTER — Encounter: Payer: Medicare Other | Admitting: Family Medicine

## 2019-04-05 ENCOUNTER — Other Ambulatory Visit: Payer: Self-pay | Admitting: *Deleted

## 2019-04-05 DIAGNOSIS — F410 Panic disorder [episodic paroxysmal anxiety] without agoraphobia: Secondary | ICD-10-CM

## 2019-04-05 MED ORDER — DIAZEPAM 2 MG PO TABS: 2.0000 mg | ORAL_TABLET | Freq: Four times a day (QID) | ORAL | 0 refills | Status: DC | PRN

## 2019-04-05 NOTE — Telephone Encounter (Signed)
Received call from patient.   Requested refill on Diazepam.   Ok to refill??  Last office visit 11/30/2018.  Last refill 4/4/ 2018.

## 2019-04-14 ENCOUNTER — Encounter: Payer: Self-pay | Admitting: *Deleted

## 2019-07-26 ENCOUNTER — Ambulatory Visit: Payer: Medicare Other | Attending: Family Medicine

## 2019-07-26 ENCOUNTER — Other Ambulatory Visit: Payer: Self-pay | Admitting: Family Medicine

## 2019-07-26 DIAGNOSIS — Z23 Encounter for immunization: Secondary | ICD-10-CM

## 2019-07-26 NOTE — Progress Notes (Signed)
   Covid-19 Vaccination Clinic  Name:  Denise Meadows    MRN: [id number] DOB: 04-15-1951  07/26/2019  Ms. [name] was observed post Covid-19 immunization for 15 minutes without incidence. She was provided with Vaccine Information Sheet and instruction to access the V-Safe system.   Ms. [name] was instructed to call 911 with any severe reactions post vaccine: Marland Kitchen Difficulty breathing  . Swelling of your face and throat  . A fast heartbeat  . A bad rash all over your body  . Dizziness and weakness    Immunizations Administered    Name Date Dose VIS Date Route   Pfizer COVID-19 Vaccine 07/26/2019 11:53 AM 0.3 mL 06/16/2019 Intramuscular   Manufacturer: [place]   Lot: [id number]   [place]: [id number]

## 2019-08-15 ENCOUNTER — Ambulatory Visit: Payer: Medicare Other | Attending: Internal Medicine

## 2019-08-15 DIAGNOSIS — Z23 Encounter for immunization: Secondary | ICD-10-CM

## 2019-08-15 NOTE — Progress Notes (Signed)
   Covid-19 Vaccination Clinic  Name:  EMILYROSE LANTEIGNE    MRN: [id number] DOB: [birth year]  08/15/2019  Ms. Dettloff was observed post Covid-19 immunization for 15 minutes without incidence. She was provided with Vaccine Information Sheet and instruction to access the V-Safe system.   Ms. Harju was instructed to call 911 with any severe reactions post vaccine: Marland Kitchen Difficulty breathing  . Swelling of your face and throat  . A fast heartbeat  . A bad rash all over your body  . Dizziness and weakness    Immunizations Administered    Name Date Dose VIS Date Route   Pfizer COVID-19 Vaccine 08/15/2019 11:23 AM 0.3 mL 06/16/2019 Intramuscular   Manufacturer: [place]   Lot: [id number]   [place]: [id number]

## 2019-09-05 ENCOUNTER — Ambulatory Visit: Payer: Medicare Other | Admitting: Physician Assistant

## 2019-11-08 ENCOUNTER — Telehealth: Payer: Self-pay | Admitting: Family Medicine

## 2019-11-08 NOTE — Chronic Care Management (AMB) (Signed)
  Chronic Care Management   Outreach Note  11/08/2019 Name: Denise Meadows MRN: [id number] DOB: 1950-08-16  Referred by: Alycia Rossetti, MD Reason for referral : Chronic Care Management   An unsuccessful telephone outreach was attempted today. The patient was referred to the pharmacist for assistance with care management and care coordination.   Follow Up Plan:   [place]

## 2019-11-21 ENCOUNTER — Ambulatory Visit (INDEPENDENT_AMBULATORY_CARE_PROVIDER_SITE_OTHER): Payer: Medicare Other | Admitting: Family Medicine

## 2019-11-21 ENCOUNTER — Other Ambulatory Visit: Payer: Self-pay

## 2019-11-21 ENCOUNTER — Encounter: Payer: Self-pay | Admitting: Family Medicine

## 2019-11-21 VITALS — BP 126/80 | HR 82 | Temp 98.1°F | Resp 16 | Ht 60.0 in | Wt 191.0 lb

## 2019-11-21 DIAGNOSIS — E559 Vitamin D deficiency, unspecified: Secondary | ICD-10-CM

## 2019-11-21 DIAGNOSIS — J208 Acute bronchitis due to other specified organisms: Secondary | ICD-10-CM | POA: Diagnosis not present

## 2019-11-21 DIAGNOSIS — K219 Gastro-esophageal reflux disease without esophagitis: Secondary | ICD-10-CM

## 2019-11-21 DIAGNOSIS — I10 Essential (primary) hypertension: Secondary | ICD-10-CM | POA: Diagnosis not present

## 2019-11-21 DIAGNOSIS — E119 Type 2 diabetes mellitus without complications: Secondary | ICD-10-CM

## 2019-11-21 DIAGNOSIS — E785 Hyperlipidemia, unspecified: Secondary | ICD-10-CM

## 2019-11-21 DIAGNOSIS — E669 Obesity, unspecified: Secondary | ICD-10-CM

## 2019-11-21 DIAGNOSIS — J302 Other seasonal allergic rhinitis: Secondary | ICD-10-CM

## 2019-11-21 MED ORDER — BENZONATATE 100 MG PO CAPS: 100.0000 mg | ORAL_CAPSULE | Freq: Three times a day (TID) | ORAL | 0 refills | Status: DC | PRN

## 2019-11-21 MED ORDER — PREDNISONE 20 MG PO TABS: 40.0000 mg | ORAL_TABLET | Freq: Every day | ORAL | 0 refills | Status: DC

## 2019-11-21 MED ORDER — OMEPRAZOLE 20 MG PO CPDR: 20.0000 mg | DELAYED_RELEASE_CAPSULE | Freq: Every day | ORAL | 2 refills | Status: DC | PRN

## 2019-11-21 NOTE — Progress Notes (Signed)
Subjective:    Patient ID: Denise Meadows, female    DOB: 1950/09/23, 69 y.o.   MRN: [id number]  Patient presents for Cough (x2 weeks- nonproductive cough with chest congestion, SOB- sore throat and sinus pressure that has resolved- worsens when she is outside)   Pt here with cough with congestion for the past 2 weeks. Initially started after her morning walk. She had sore throat initially and sinus pressure. SHe drank hot liquids which helped some. She never had runny nose. When coughing never had production  she used vicks vapor rub with no improvement.  The sore throat and sinus pressure resolved. No lost in taste or smell  no fever  No sick contacts   when she goes outside she feels like she stops up more  She took benadryl one night and felt very groggy  She she has coughing fits feels SOB    She is overdue for f/u on chronic medical problems and is fasting today    DM- taking actos, metformin, fasting 90-120, last A1C in May  2020 6%   Hyperlipidemia taking simivastatin 20mg  at beditme    GERD- omeprazole takes 20mg  once a day as needed, does not take daily    HTN-  Taking metoprolol and ramipril without any difficulty     h/o of Panic attacks has not needed valium for  > 6  months but keeps on hand       Had COVID-19 vaccine in jan 2021   Review Of Systems:  GEN- denies fatigue, fever, weight loss,weakness, recent illness HEENT- denies eye drainage, change in vision, nasal discharge, CVS- denies chest pain, palpitations RESP- denies SOB,+ cough, wheeze ABD- denies N/V, change in stools, abd pain GU- denies dysuria, hematuria, dribbling, incontinence MSK- denies joint pain, muscle aches, injury Neuro- denies headache, dizziness, syncope, seizure activity       Objective:    BP 126/80   Pulse 82   Temp 98.1 F (36.7 C) (Temporal)   Resp 16   Ht 5' (1.524 m)   Wt 191 lb (86.6 kg)   SpO2 96%   BMI 37.30 kg/m  GEN- NAD, alert and oriented x3 HEENT- PERRL,  EOMI, non injected sclera, pink conjunctiva, MMM, oropharynx clear Neck- Supple, no thyromegaly CVS- RRR, no murmur RESP-CTAB, harsh cough, no wheeze, normal WOB at rest  ABD-NABS,soft,NT,ND EXT- trace ankle  edema Pulses- Radial, DP- 2+        Assessment & Plan:      Problem List Items Addressed This Visit      Unprioritized   Class 2 obesity   Diabetes mellitus type 2, uncomplicated (HCC) (Chronic)    Has been controlled on oralmeds Recheck A1C, goal < 7%      Relevant Orders   [facility] DIABETES FOOT EXAM (Completed)   CBC with Differential/Platelet   Comprehensive metabolic panel   Hemoglobin A1c   HLD (hyperlipidemia) (Chronic)    Continue zocor       Relevant Orders   Lipid panel   HTN (hypertension) - Primary (Chronic)    Controlled no changes Check renal function       Vitamin D deficiency   Relevant Orders   Vitamin D, 25-hydroxy    Other Visit Diagnoses    Acute bronchitis due to other specified organisms       bronchitic cough, no red flags, normal oxygen sat, will give burst of steroids, add claritin or zyrtec, tessalon , no imaging needed at this time  Seasonal allergies       Gastroesophageal reflux disease       Relevant Medications   omeprazole (PRILOSEC) 20 MG capsule      Note: This dictation was prepared with Dragon dictation along with smaller phrase technology. Any transcriptional errors that result from this process are unintentional.

## 2019-11-21 NOTE — Patient Instructions (Addendum)
Take allergy medicine such as claritin or zyrtec  Take prednisone  Take tesslon perrles  We will call with lab results  F/U 6 months for Physical

## 2019-11-21 NOTE — Assessment & Plan Note (Signed)
Has been controlled on oralmeds Recheck A1C, goal < 7%

## 2019-11-21 NOTE — Assessment & Plan Note (Signed)
Controlled no changes Check renal function 

## 2019-11-21 NOTE — Assessment & Plan Note (Signed)
Continue zocor 

## 2019-11-30 ENCOUNTER — Other Ambulatory Visit: Payer: Self-pay

## 2019-11-30 ENCOUNTER — Other Ambulatory Visit: Payer: Medicare Other

## 2019-12-01 LAB — LIPID PANEL
Cholesterol: 191 mg/dL (ref <200)
HDL: 55 mg/dL (ref >=50)
LDL Cholesterol (Calc): 95 mg/dL (calc)
Non-HDL Cholesterol (Calc): 136 mg/dL (calc) — ABNORMAL HIGH (ref <130)
Total CHOL/HDL Ratio: 3.5 (calc) (ref <5.0)
Triglycerides: 288 mg/dL — ABNORMAL HIGH (ref <150)

## 2019-12-01 LAB — COMPREHENSIVE METABOLIC PANEL
AG Ratio: 1.5 (calc) (ref 1.0–2.5)
ALT: 14 U/L (ref 6–29)
AST: 16 U/L (ref 10–35)
Albumin: 4.3 g/dL (ref 3.6–5.1)
Alkaline phosphatase (APISO): 80 U/L (ref 37–153)
BUN/Creatinine Ratio: 26 (calc) — ABNORMAL HIGH (ref 6–22)
BUN: 32 mg/dL — ABNORMAL HIGH (ref 7–25)
CO2: 25 mmol/L (ref 20–32)
Calcium: 9.8 mg/dL (ref 8.6–10.4)
Chloride: 98 mmol/L (ref 98–110)
Creat: 1.25 mg/dL — ABNORMAL HIGH (ref 0.50–0.99)
Globulin: 2.8 g/dL (calc) (ref 1.9–3.7)
Glucose, Bld: 109 mg/dL — ABNORMAL HIGH (ref 65–99)
Potassium: 4.2 mmol/L (ref 3.5–5.3)
Sodium: 134 mmol/L — ABNORMAL LOW (ref 135–146)
Total Bilirubin: 0.5 mg/dL (ref 0.2–1.2)
Total Protein: 7.1 g/dL (ref 6.1–8.1)

## 2019-12-01 LAB — CBC WITH DIFFERENTIAL/PLATELET
Absolute Monocytes: 762 cells/uL (ref 200–950)
Basophils Absolute: 51 cells/uL (ref 0–200)
Basophils Relative: 0.8 %
Eosinophils Absolute: 198 cells/uL (ref 15–500)
Eosinophils Relative: 3.1 %
HCT: 42.3 % (ref 35.0–45.0)
Hemoglobin: 14 g/dL (ref 11.7–15.5)
Lymphs Abs: 1939 cells/uL (ref 850–3900)
MCH: 31.6 pg (ref 27.0–33.0)
MCHC: 33.1 g/dL (ref 32.0–36.0)
MCV: 95.5 fL (ref 80.0–100.0)
MPV: 12.8 fL — ABNORMAL HIGH (ref 7.5–12.5)
Monocytes Relative: 11.9 %
Neutro Abs: 3450 cells/uL (ref 1500–7800)
Neutrophils Relative %: 53.9 %
Platelets: 260 10*3/uL (ref 140–400)
RBC: 4.43 10*6/uL (ref 3.80–5.10)
RDW: 12.8 % (ref 11.0–15.0)
Total Lymphocyte: 30.3 %
WBC: 6.4 10*3/uL (ref 3.8–10.8)

## 2019-12-01 LAB — HEMOGLOBIN A1C
Hgb A1c MFr Bld: 6 % of total Hgb — ABNORMAL HIGH (ref <5.7)
Mean Plasma Glucose: 126 (calc)
eAG (mmol/L): 7 (calc)

## 2019-12-01 LAB — VITAMIN D 25 HYDROXY (VIT D DEFICIENCY, FRACTURES): Vit D, 25-Hydroxy: 29 ng/mL — ABNORMAL LOW (ref 30–100)

## 2020-01-18 ENCOUNTER — Other Ambulatory Visit: Payer: Self-pay | Admitting: Family Medicine

## 2020-02-22 ENCOUNTER — Other Ambulatory Visit: Payer: Self-pay | Admitting: *Deleted

## 2020-02-22 MED ORDER — GLUCOSE BLOOD VI STRP: ORAL_STRIP | 3 refills | Status: DC

## 2020-02-22 MED ORDER — ONETOUCH ULTRASOFT LANCETS MISC: 3 refills | Status: DC

## 2020-02-22 MED ORDER — ONETOUCH ULTRA 2 W/DEVICE KIT: PACK | 1 refills | Status: DC

## 2020-03-07 ENCOUNTER — Telehealth: Payer: Self-pay | Admitting: Family Medicine

## 2020-03-07 NOTE — Progress Notes (Signed)
  Chronic Care Management   Outreach Note  03/07/2020 Name: Denise Meadows MRN: [id number] DOB: December 08, 1950  Referred by: Alycia Rossetti, MD Reason for referral : No chief complaint on file.   An unsuccessful telephone outreach was attempted today. The patient was referred to the pharmacist for assistance with care management and care coordination.   Follow Up Plan:   Carley Perdue [employer] [facility]

## 2020-03-14 ENCOUNTER — Telehealth: Payer: Self-pay | Admitting: Family Medicine

## 2020-03-14 NOTE — Progress Notes (Signed)
  Chronic Care Management   Outreach Note  03/14/2020 Name: Denise Meadows MRN: [id number] DOB: [birth year]  Referred by: Alycia Rossetti, MD Reason for referral : No chief complaint on file.   A second unsuccessful telephone outreach was attempted today. The patient was referred to pharmacist for assistance with care management and care coordination.  Follow Up Plan:   Carley Perdue [employer] Sched[facility]

## 2020-03-22 ENCOUNTER — Telehealth: Payer: Self-pay | Admitting: Family Medicine

## 2020-03-22 NOTE — Progress Notes (Signed)
  Chronic Care Management   Outreach Note  03/22/2020 Name: Denise Meadows MRN: [id number] DOB: Apr 19, 1951  Referred by: Alycia Rossetti, MD Reason for referral : No chief complaint on file.   Third unsuccessful telephone outreach was attempted today. The patient was referred to the pharmacist for assistance with care management and care coordination.   Follow Up Plan:   Carley Perdue [employer] Sched[facility]

## 2020-03-23 ENCOUNTER — Other Ambulatory Visit: Payer: Self-pay | Admitting: Family Medicine

## 2020-04-17 ENCOUNTER — Encounter: Payer: Self-pay | Admitting: Family Medicine

## 2020-04-17 ENCOUNTER — Ambulatory Visit (INDEPENDENT_AMBULATORY_CARE_PROVIDER_SITE_OTHER): Payer: Medicare Other | Admitting: Family Medicine

## 2020-04-17 ENCOUNTER — Other Ambulatory Visit: Payer: Self-pay

## 2020-04-17 VITALS — BP 128/66 | HR 66 | Temp 97.8°F | Resp 16 | Ht 60.0 in | Wt 185.0 lb

## 2020-04-17 DIAGNOSIS — E119 Type 2 diabetes mellitus without complications: Secondary | ICD-10-CM

## 2020-04-17 DIAGNOSIS — E785 Hyperlipidemia, unspecified: Secondary | ICD-10-CM | POA: Diagnosis not present

## 2020-04-17 DIAGNOSIS — E669 Obesity, unspecified: Secondary | ICD-10-CM

## 2020-04-17 DIAGNOSIS — I10 Essential (primary) hypertension: Secondary | ICD-10-CM | POA: Diagnosis not present

## 2020-04-17 DIAGNOSIS — I5189 Other ill-defined heart diseases: Secondary | ICD-10-CM

## 2020-04-17 MED ORDER — METFORMIN HCL 1000 MG PO TABS
1000.0000 mg | ORAL_TABLET | Freq: Every day | ORAL | 1 refills | Status: DC
Start: 2020-04-17 — End: 2020-10-23

## 2020-04-17 NOTE — Assessment & Plan Note (Signed)
Pressure control no change in medication.

## 2020-04-17 NOTE — Assessment & Plan Note (Signed)
A1c has been controlled on reduced dose of Metformin.  She will continue this along with Actos goal is less than 7%.  On statin drug.  She is also on ACE inhibitor.  She will schedule follow-up with her ophthalmologist.

## 2020-04-17 NOTE — Assessment & Plan Note (Signed)
Currently compensated does not have any symptoms.  No fluid overload noted.

## 2020-04-17 NOTE — Progress Notes (Signed)
   Subjective:    Patient ID: Denise Meadows, female    DOB: 10-09-1950, 70 y.o.   MRN: [id number]  Patient presents for Follow Up (is fasting)  Pt here to f/u chronic medical problems     DM- taking actos 30mg  , metformin 1000mg   Once a day he has been doing this for over a year  she actually reduced the dose on her own, CBG weekly 90 - 120's , last A1C in May  20021- 6%  due for eye exam    Hyperlipidemia taking simivastatin 20mg  at beditme    GERD- omeprazole takes 20mg  once a day as needed, does not take daily    HTN-  Taking metoprolol and ramipril without any difficulty     h/o of Panic attacks has not needed valium for  > 6  months but keeps on hand    She had recent Covid-19 booster , will wait 3 weeks for Booster    Has rosacea prn metrogel   She is due for mammogram   Review Of Systems:  GEN- denies fatigue, fever, weight loss,weakness, recent illness HEENT- denies eye drainage, change in vision, nasal discharge, CVS- denies chest pain, palpitations RESP- denies SOB, cough, wheeze ABD- denies N/V, change in stools, abd pain GU- denies dysuria, hematuria, dribbling, incontinence MSK- denies joint pain, muscle aches, injury Neuro- denies headache, dizziness, syncope, seizure activity       Objective:    BP 128/66   Pulse 66   Temp 97.8 F (36.6 C) (Temporal)   Resp 16   Ht 5' (1.524 m)   Wt 185 lb (83.9 kg)   SpO2 92%   BMI 36.13 kg/m  GEN- NAD, alert and oriented x3 HEENT- PERRL, EOMI, non injected sclera, pink conjunctiva, MMM, oropharynx clear Neck- Supple, no thyromegaly CVS- RRR, no murmur RESP-CTAB ABD-NABS,soft,NT,ND EXT- No edema Pulses- Radial, DP- 2+        Assessment & Plan:      Problem List Items Addressed This Visit      Unprioritized   Class 2 obesity   Diabetes mellitus type 2, uncomplicated (HCC) (Chronic)    A1c has been controlled on reduced dose of Metformin.  She will continue this along with Actos goal is less  than 7%.  On statin drug.  She is also on ACE inhibitor.  She will schedule follow-up with her ophthalmologist.      Relevant Medications   metFORMIN (GLUCOPHAGE) 1000 MG tablet   Other Relevant Orders   Hemoglobin T2W   Diastolic dysfunction    Currently compensated does not have any symptoms.  No fluid overload noted.      HLD (hyperlipidemia) (Chronic)   Relevant Orders   Lipid panel   HTN (hypertension) - Primary (Chronic)    Pressure control no change in medication.      Relevant Orders   CBC with Differential/Platelet   Comprehensive metabolic panel      Note: This dictation was prepared with Dragon dictation along with smaller phrase technology. Any transcriptional errors that result from this process are unintentional.

## 2020-04-17 NOTE — Patient Instructions (Signed)
F/U 4 months for wellness visit  

## 2020-04-18 LAB — COMPREHENSIVE METABOLIC PANEL
AG Ratio: 1.5 (calc) (ref 1.0–2.5)
ALT: 12 U/L (ref 6–29)
AST: 17 U/L (ref 10–35)
Albumin: 4.1 g/dL (ref 3.6–5.1)
Alkaline phosphatase (APISO): 68 U/L (ref 37–153)
BUN/Creatinine Ratio: 23 (calc) — ABNORMAL HIGH (ref 6–22)
BUN: 26 mg/dL — ABNORMAL HIGH (ref 7–25)
CO2: 24 mmol/L (ref 20–32)
Calcium: 9.6 mg/dL (ref 8.6–10.4)
Chloride: 103 mmol/L (ref 98–110)
Creat: 1.14 mg/dL — ABNORMAL HIGH (ref 0.50–0.99)
Globulin: 2.7 g/dL (calc) (ref 1.9–3.7)
Glucose, Bld: 96 mg/dL (ref 65–99)
Potassium: 4.7 mmol/L (ref 3.5–5.3)
Sodium: 136 mmol/L (ref 135–146)
Total Bilirubin: 0.4 mg/dL (ref 0.2–1.2)
Total Protein: 6.8 g/dL (ref 6.1–8.1)

## 2020-04-18 LAB — LIPID PANEL
Cholesterol: 164 mg/dL (ref <200)
HDL: 46 mg/dL — ABNORMAL LOW (ref >=50)
LDL Cholesterol (Calc): 91 mg/dL (calc)
Non-HDL Cholesterol (Calc): 118 mg/dL (calc) (ref <130)
Total CHOL/HDL Ratio: 3.6 (calc) (ref <5.0)
Triglycerides: 176 mg/dL — ABNORMAL HIGH (ref <150)

## 2020-04-18 LAB — CBC WITH DIFFERENTIAL/PLATELET
Absolute Monocytes: 655 cells/uL (ref 200–950)
Basophils Absolute: 50 cells/uL (ref 0–200)
Basophils Relative: 1 %
Eosinophils Absolute: 365 cells/uL (ref 15–500)
Eosinophils Relative: 7.3 %
HCT: 37.9 % (ref 35.0–45.0)
Hemoglobin: 12.4 g/dL (ref 11.7–15.5)
Lymphs Abs: 1425 cells/uL (ref 850–3900)
MCH: 31.3 pg (ref 27.0–33.0)
MCHC: 32.7 g/dL (ref 32.0–36.0)
MCV: 95.7 fL (ref 80.0–100.0)
MPV: 12.3 fL (ref 7.5–12.5)
Monocytes Relative: 13.1 %
Neutro Abs: 2505 cells/uL (ref 1500–7800)
Neutrophils Relative %: 50.1 %
Platelets: 243 10*3/uL (ref 140–400)
RBC: 3.96 10*6/uL (ref 3.80–5.10)
RDW: 12.6 % (ref 11.0–15.0)
Total Lymphocyte: 28.5 %
WBC: 5 10*3/uL (ref 3.8–10.8)

## 2020-04-18 LAB — HEMOGLOBIN A1C
Hgb A1c MFr Bld: 5.9 % of total Hgb — ABNORMAL HIGH (ref <5.7)
Mean Plasma Glucose: 123 (calc)
eAG (mmol/L): 6.8 (calc)

## 2020-04-22 ENCOUNTER — Encounter: Payer: Self-pay | Admitting: *Deleted

## 2020-08-25 ENCOUNTER — Other Ambulatory Visit: Payer: Self-pay | Admitting: Family Medicine

## 2020-10-23 ENCOUNTER — Encounter: Payer: Self-pay | Admitting: Family Medicine

## 2020-10-23 ENCOUNTER — Ambulatory Visit (INDEPENDENT_AMBULATORY_CARE_PROVIDER_SITE_OTHER): Payer: Medicare Other | Admitting: Family Medicine

## 2020-10-23 ENCOUNTER — Other Ambulatory Visit: Payer: Self-pay

## 2020-10-23 VITALS — BP 139/68 | HR 52 | Temp 96.9°F | Ht 60.0 in | Wt 184.6 lb

## 2020-10-23 DIAGNOSIS — F410 Panic disorder [episodic paroxysmal anxiety] without agoraphobia: Secondary | ICD-10-CM

## 2020-10-23 DIAGNOSIS — Z79899 Other long term (current) drug therapy: Secondary | ICD-10-CM | POA: Diagnosis not present

## 2020-10-23 DIAGNOSIS — E119 Type 2 diabetes mellitus without complications: Secondary | ICD-10-CM | POA: Diagnosis not present

## 2020-10-23 DIAGNOSIS — R6889 Other general symptoms and signs: Secondary | ICD-10-CM | POA: Diagnosis not present

## 2020-10-23 DIAGNOSIS — I10 Essential (primary) hypertension: Secondary | ICD-10-CM | POA: Diagnosis not present

## 2020-10-23 DIAGNOSIS — E782 Mixed hyperlipidemia: Secondary | ICD-10-CM

## 2020-10-23 DIAGNOSIS — E559 Vitamin D deficiency, unspecified: Secondary | ICD-10-CM

## 2020-10-23 DIAGNOSIS — K219 Gastro-esophageal reflux disease without esophagitis: Secondary | ICD-10-CM

## 2020-10-23 DIAGNOSIS — E1165 Type 2 diabetes mellitus with hyperglycemia: Secondary | ICD-10-CM

## 2020-10-23 MED ORDER — SIMVASTATIN 20 MG PO TABS: 20.0000 mg | ORAL_TABLET | Freq: Every day | ORAL | 1 refills | Status: DC

## 2020-10-23 MED ORDER — METOPROLOL SUCCINATE ER 25 MG PO TB24: 1.0000 | ORAL_TABLET | Freq: Every day | ORAL | 1 refills | Status: DC

## 2020-10-23 MED ORDER — OMEPRAZOLE 20 MG PO CPDR: 20.0000 mg | DELAYED_RELEASE_CAPSULE | Freq: Every day | ORAL | 1 refills | Status: DC | PRN

## 2020-10-23 MED ORDER — RAMIPRIL 10 MG PO CAPS: 10.0000 mg | ORAL_CAPSULE | Freq: Every day | ORAL | 1 refills | Status: DC

## 2020-10-23 MED ORDER — METFORMIN HCL 1000 MG PO TABS: 1000.0000 mg | ORAL_TABLET | Freq: Every day | ORAL | 1 refills | Status: DC

## 2020-10-23 MED ORDER — PIOGLITAZONE HCL 30 MG PO TABS: 30.0000 mg | ORAL_TABLET | Freq: Every day | ORAL | 1 refills | Status: DC

## 2020-10-23 NOTE — Progress Notes (Signed)
New Patient Office Visit  Assessment & Plan:  1. Type 2 diabetes mellitus with hyperglycemia, without long-term current use of insulin (HCC) Lab Results  Component Value Date   HGBA1C WILL FOLLOW 10/23/2020   HGBA1C 5.9 (H) 04/17/2020   HGBA1C 6.0 (H) 11/21/2019    A1c pending - Medications: continue current medications.  Discussed if A1c is well controlled we will try to get her off some medications starting with Actos. - Home glucose monitoring: Continue monitoring - Patient is currently taking a statin. Patient is taking an ACE-inhibitor/ARB.  - Instruction/counseling given: reminded to get eye exam  Diabetes Health Maintenance Due  Topic Date Due  . OPHTHALMOLOGY EXAM  03/23/2020  . HEMOGLOBIN A1C  10/16/2020  . FOOT EXAM  11/20/2020    Lab Results  Component Value Date   MICROALBUR 4.4 07/20/2018   MICROALBUR 1.2 04/08/2017   - metFORMIN (GLUCOPHAGE) 1000 MG tablet; Take 1 tablet (1,000 mg total) by mouth daily with breakfast.  Dispense: 90 tablet; Refill: 1 - pioglitazone (ACTOS) 30 MG tablet; Take 1 tablet (30 mg total) by mouth daily.  Dispense: 90 tablet; Refill: 1 - CBC with Differential/Platelet - CMP14+EGFR - Lipid panel - Vitamin B12 - Hgb A1c w/o eAG - Specimen status report  2. Primary hypertension Well controlled on current regimen.  - metoprolol succinate (TOPROL-XL) 25 MG 24 hr tablet; Take 1 tablet (25 mg total) by mouth daily.  Dispense: 90 tablet; Refill: 1 - ramipril (ALTACE) 10 MG capsule; Take 1 capsule (10 mg total) by mouth daily.  Dispense: 90 capsule; Refill: 1 - CBC with Differential/Platelet - CMP14+EGFR - Lipid panel  3. Mixed hyperlipidemia Labs to assess. - simvastatin (ZOCOR) 20 MG tablet; Take 1 tablet (20 mg total) by mouth at bedtime.  Dispense: 90 tablet; Refill: 1 - CMP14+EGFR - Lipid panel  4. Panic attacks Well controlled on current regimen.  - Compliance Drug Analysis, Ur  5. Controlled substance agreement  signed Controlled substance agreement signed today.  Urine drug screen collected today.  PDMP reviewed with no concerning findings. - Compliance Drug Analysis, Ur  6. Vitamin D deficiency Labs to assess. - VITAMIN D 25 Hydroxy (Vit-D Deficiency, Fractures)  7. Gastroesophageal reflux disease Well controlled on current regimen.  - omeprazole (PRILOSEC) 20 MG capsule; Take 1 capsule (20 mg total) by mouth daily as needed.  Dispense: 90 capsule; Refill: 1 - CMP14+EGFR  8. Morbid obesity ([place]) Diet and exercise encouraged.   Follow-up: Return as directed after labs result.   Hendricks Limes, MSN, APRN, FNP-C [facility]  Subjective:  Patient ID: CHANYA CHRISLEY, female    DOB: 1950-08-27  Age: 70 y.o. MRN: [id number]  Patient Care Team: Loman Brooklyn, FNP as PCP - General (Family Medicine) Danie Binder, MD (Inactive) (Gastroenterology) Edythe Clarity, [facility] as Pharmacist (Pharmacist)  CC:  Chief Complaint  Patient presents with  . New Patient (Initial Visit)    [facility]  . Establish Care         HPI MELANNY WIRE presents to establish care.   Diabetes: Patient presents for follow up of diabetes. Current symptoms include: none. Known diabetic complications: none. Medication compliance: Taking Actos 30 mg once daily and metformin 1000 mg once daily. Current diet: somewhat healthy. Current exercise: walking. Home blood sugar records: BGs range between 90 and 120. Is she  on ACE inhibitor or angiotensin II receptor blocker? Yes. Is she on a statin? Yes.   Panic  attacks: Patient reports 2 to 3 years ago she started having panic attacks.  This is the only time she takes a Valium.  She has not had to take one in the past year and a half.   Review of Systems  Constitutional: Negative for chills, fever, malaise/fatigue and weight loss.  HENT: Negative for congestion, ear discharge, ear pain, nosebleeds, sinus pain, sore throat and  tinnitus.   Eyes: Negative for blurred vision, double vision, pain, discharge and redness.  Respiratory: Negative for cough, shortness of breath and wheezing.   Cardiovascular: Negative for chest pain, palpitations and leg swelling.  Gastrointestinal: Negative for abdominal pain, constipation, diarrhea, heartburn, nausea and vomiting.  Genitourinary: Negative for dysuria, frequency and urgency.  Musculoskeletal: Negative for myalgias.  Skin: Negative for rash.  Neurological: Negative for dizziness, seizures, weakness and headaches.  Psychiatric/Behavioral: Negative for depression, substance abuse and suicidal ideas. The patient is not nervous/anxious.     Current Outpatient Medications:  .  calcium gluconate 500 MG tablet, Take 1 tablet (500 mg total) 2 (two) times daily by mouth., Disp: 60 tablet, Rfl: 11 .  Cholecalciferol (VITAMIN D) 2000 units tablet, Take 1 tablet (2,000 Units total) by mouth daily., Disp: 90 tablet, Rfl: 2 .  diazepam (VALIUM) 2 MG tablet, Take 1 tablet (2 mg total) by mouth every 6 (six) hours as needed for anxiety., Disp: 20 tablet, Rfl: 0 .  glucose blood test strip, Please dispense as OneTouch Ultra 2. Use as directed to monitor FSBS 1x weekly. Dx: E11.9., Disp: 100 each, Rfl: 3 .  Lancets (ONETOUCH ULTRASOFT) lancets, Use as directed to monitor FSBS 1x weekly. Dx: E11.9., Disp: 100 each, Rfl: 3 .  metFORMIN (GLUCOPHAGE) 1000 MG tablet, Take 1 tablet (1,000 mg total) by mouth daily with breakfast., Disp: 180 tablet, Rfl: 1 .  metoprolol succinate (TOPROL-XL) 25 MG 24 hr tablet, TAKE 1 TABLET BY MOUTH  DAILY, Disp: 90 tablet, Rfl: 1 .  metroNIDAZOLE (METROGEL) 0.75 % gel, Apply 1 application topically as needed., Disp: , Rfl:  .  Multiple Vitamin (MULTIVITAMIN WITH MINERALS) TABS tablet, Take 1 tablet by mouth daily., Disp: , Rfl:  .  Omega-3 Fatty Acids (FISH OIL) 1000 MG CAPS, Take 1 capsule (1,000 mg total) by mouth 2 (two) times daily., Disp: , Rfl: 0 .   omeprazole (PRILOSEC) 20 MG capsule, Take 1 capsule (20 mg total) by mouth daily as needed., Disp: 90 capsule, Rfl: 2 .  pioglitazone (ACTOS) 30 MG tablet, TAKE 1 TABLET BY MOUTH  DAILY, Disp: 90 tablet, Rfl: 3 .  ramipril (ALTACE) 10 MG capsule, TAKE 1 CAPSULE BY MOUTH  DAILY, Disp: 90 capsule, Rfl: 1 .  simvastatin (ZOCOR) 20 MG tablet, TAKE 1 TABLET BY MOUTH AT  BEDTIME, Disp: 90 tablet, Rfl: 3 .  vitamin B-12 (CYANOCOBALAMIN) 100 MCG tablet, Take 1 tablet (100 mcg total) by mouth daily., Disp: , Rfl:   Allergies  Allergen Reactions  . Codeine Nausea And Vomiting  . Penicillins Rash    Past Medical History:  Diagnosis Date  . Complication of anesthesia   . DDD (degenerative disc disease)   . Diabetes mellitus   . Elevated liver function tests    eval by dr Leonides Grills to fatty liver 2008  . GERD (gastroesophageal reflux disease)   . Hiatal hernia   . Hyperlipidemia   . Hypertension   . NASH (nonalcoholic steatohepatitis)   . Nephrolithiasis   . PONV (postoperative nausea and vomiting)    heel surgery  .  Pulmonary nodule    no change-1995  . Rosacea   . Ulcer    gastric ulcer, antritis-egd 09/06/08  . Vitamin D deficiency     Past Surgical History:  Procedure Laterality Date  . BREAST SURGERY     benign breast nodules 2005  . CHOLECYSTECTOMY    . COLONOSCOPY  2008   Dr Raford Pitcher  . DILATION AND CURETTAGE, DIAGNOSTIC / THERAPEUTIC    . ESOPHAGOGASTRODUODENOSCOPY    . FRACTURE SURGERY    . MALONEY DILATION  02/08/2012   Procedure: MALONEY DILATION;  Surgeon: Danie Binder, MD;  [facility];  Service: Endoscopy;  Laterality: N/A;  . SAVORY DILATION  02/08/2012   Procedure: SAVORY DILATION;  Surgeon: Danie Binder, MD;  [facility];  Service: Endoscopy;  Laterality: N/A;  . SPINE SURGERY      Family History  Problem Relation Age of Onset  . Hyperlipidemia Mother   . COPD Father   . Colon cancer Maternal Aunt   . Colon cancer Maternal  Grandfather   . Heart disease Neg Hx     Social History   Socioeconomic History  . Marital status: Married    Spouse name: Not on file  . Number of children: 2  . Years of education: Not on file  . Highest education level: Not on file  Occupational History  . Occupation: homemaker  Tobacco Use  . Smoking status: Former Smoker    Types: Cigarettes    Quit date: 07/06/1988    Years since quitting: 32.3  . Smokeless tobacco: Never Used  . Tobacco comment: intermittent for a few months  Vaping Use  . Vaping Use: Never used  Substance and Sexual Activity  . Alcohol use: Not Currently    Comment: occasional mixed drink couple per mo  . Drug use: No  . Sexual activity: Not on file  Other Topics Concern  . Not on file  Social History Narrative   Lives w/ husband   Social Determinants of Health   Financial Resource Strain: Not on file  Food Insecurity: Not on file  Transportation Needs: Not on file  Physical Activity: Not on file  Stress: Not on file  Social Connections: Not on file  Intimate Partner Violence: Not on file    Objective:   Today's Vitals: BP 139/68   Pulse (!) 52   Temp (!) 96.9 F (36.1 C) (Temporal)   Ht 5' (1.524 m)   Wt 184 lb 9.6 oz (83.7 kg)   SpO2 96%   BMI 36.05 kg/m   Physical Exam Vitals reviewed.  Constitutional:      General: She is not in acute distress.    Appearance: Normal appearance. She is morbidly obese. She is not ill-appearing, toxic-appearing or diaphoretic.  HENT:     Head: Normocephalic and atraumatic.  Eyes:     General: No scleral icterus.       Right eye: No discharge.        Left eye: No discharge.     Conjunctiva/sclera: Conjunctivae normal.  Cardiovascular:     Rate and Rhythm: Normal rate and regular rhythm.     Heart sounds: Normal heart sounds. No murmur heard. No friction rub. No gallop.   Pulmonary:     Effort: Pulmonary effort is normal. No respiratory distress.     Breath sounds: Normal breath sounds.  No stridor. No wheezing, rhonchi or rales.  Musculoskeletal:        General: Normal range  of motion.     Cervical back: Normal range of motion.  Skin:    General: Skin is warm and dry.     Capillary Refill: Capillary refill takes less than 2 seconds.  Neurological:     General: No focal deficit present.     Mental Status: She is alert and oriented to person, place, and time. Mental status is at baseline.  Psychiatric:        Mood and Affect: Mood normal.        Behavior: Behavior normal.        Thought Content: Thought content normal.        Judgment: Judgment normal.

## 2020-10-24 LAB — CBC WITH DIFFERENTIAL/PLATELET
Basophils Absolute: 0.1 10*3/uL (ref 0.0–0.2)
Basos: 1 %
EOS (ABSOLUTE): 0.3 10*3/uL (ref 0.0–0.4)
Eos: 5 %
Hematocrit: 39.2 % (ref 34.0–46.6)
Hemoglobin: 12.8 g/dL (ref 11.1–15.9)
Immature Grans (Abs): 0 10*3/uL (ref 0.0–0.1)
Immature Granulocytes: 0 %
Lymphocytes Absolute: 1.7 10*3/uL (ref 0.7–3.1)
Lymphs: 27 %
MCH: 31.2 pg (ref 26.6–33.0)
MCHC: 32.7 g/dL (ref 31.5–35.7)
MCV: 96 fL (ref 79–97)
Monocytes Absolute: 0.8 10*3/uL (ref 0.1–0.9)
Monocytes: 13 %
Neutrophils Absolute: 3.4 10*3/uL (ref 1.4–7.0)
Neutrophils: 54 %
Platelets: 295 10*3/uL (ref 150–450)
RBC: 4.1 x10E6/uL (ref 3.77–5.28)
RDW: 12.3 % (ref 11.7–15.4)
WBC: 6.3 10*3/uL (ref 3.4–10.8)

## 2020-10-24 LAB — VITAMIN B12: Vitamin B-12: 1498 pg/mL — ABNORMAL HIGH (ref 232–1245)

## 2020-10-24 LAB — LIPID PANEL
Chol/HDL Ratio: 3.7 ratio (ref 0.0–4.4)
Cholesterol, Total: 191 mg/dL (ref 100–199)
HDL: 51 mg/dL (ref >39)
LDL Chol Calc (NIH): 112 mg/dL — ABNORMAL HIGH (ref 0–99)
Triglycerides: 161 mg/dL — ABNORMAL HIGH (ref 0–149)
VLDL Cholesterol Cal: 28 mg/dL (ref 5–40)

## 2020-10-24 LAB — CMP14+EGFR
ALT: 15 IU/L (ref 0–32)
AST: 23 IU/L (ref 0–40)
Albumin/Globulin Ratio: 1.6 (ref 1.2–2.2)
Albumin: 4.5 g/dL (ref 3.8–4.8)
Alkaline Phosphatase: 87 IU/L (ref 44–121)
BUN/Creatinine Ratio: 20 (ref 12–28)
BUN: 23 mg/dL (ref 8–27)
Bilirubin Total: 0.3 mg/dL (ref 0.0–1.2)
CO2: 24 mmol/L (ref 20–29)
Calcium: 9.9 mg/dL (ref 8.7–10.3)
Chloride: 98 mmol/L (ref 96–106)
Creatinine, Ser: 1.16 mg/dL — ABNORMAL HIGH (ref 0.57–1.00)
Globulin, Total: 2.8 g/dL (ref 1.5–4.5)
Glucose: 103 mg/dL — ABNORMAL HIGH (ref 65–99)
Potassium: 4.8 mmol/L (ref 3.5–5.2)
Sodium: 137 mmol/L (ref 134–144)
Total Protein: 7.3 g/dL (ref 6.0–8.5)
eGFR: 51 mL/min/{1.73_m2} — ABNORMAL LOW (ref >59)

## 2020-10-24 LAB — VITAMIN D 25 HYDROXY (VIT D DEFICIENCY, FRACTURES): Vit D, 25-Hydroxy: 33.9 ng/mL (ref 30.0–100.0)

## 2020-10-25 ENCOUNTER — Telehealth: Payer: Self-pay

## 2020-10-25 NOTE — Telephone Encounter (Signed)
A1c added ?

## 2020-10-25 NOTE — Telephone Encounter (Signed)
Wanting result of A1C- looks like one was not ordered  Do you know why we did not order a A1C?

## 2020-10-25 NOTE — Telephone Encounter (Signed)
It must have got missed on accident. Can we add one on?

## 2020-10-28 ENCOUNTER — Encounter: Payer: Self-pay | Admitting: Family Medicine

## 2020-10-28 DIAGNOSIS — E66811 Obesity, class 1: Secondary | ICD-10-CM | POA: Insufficient documentation

## 2020-10-28 DIAGNOSIS — Z79899 Other long term (current) drug therapy: Secondary | ICD-10-CM | POA: Insufficient documentation

## 2020-10-28 DIAGNOSIS — F410 Panic disorder [episodic paroxysmal anxiety] without agoraphobia: Secondary | ICD-10-CM | POA: Insufficient documentation

## 2020-10-28 DIAGNOSIS — E669 Obesity, unspecified: Secondary | ICD-10-CM | POA: Insufficient documentation

## 2020-10-28 LAB — SPECIMEN STATUS REPORT

## 2020-10-28 LAB — HGB A1C W/O EAG: Hgb A1c MFr Bld: 5.9 % — ABNORMAL HIGH (ref 4.8–5.6)

## 2020-10-30 ENCOUNTER — Other Ambulatory Visit: Payer: Self-pay | Admitting: Family Medicine

## 2020-10-30 DIAGNOSIS — E1165 Type 2 diabetes mellitus with hyperglycemia: Secondary | ICD-10-CM

## 2020-10-30 MED ORDER — PIOGLITAZONE HCL 30 MG PO TABS
15.0000 mg | ORAL_TABLET | Freq: Every day | ORAL | 1 refills | Status: DC
Start: 2020-10-30 — End: 2021-01-23

## 2020-10-31 LAB — COMPLIANCE DRUG ANALYSIS, UR

## 2020-11-29 ENCOUNTER — Other Ambulatory Visit: Payer: Self-pay | Admitting: Family Medicine

## 2020-11-29 DIAGNOSIS — Z1231 Encounter for screening mammogram for malignant neoplasm of breast: Secondary | ICD-10-CM

## 2021-01-22 ENCOUNTER — Ambulatory Visit (INDEPENDENT_AMBULATORY_CARE_PROVIDER_SITE_OTHER): Payer: Medicare Other | Admitting: *Deleted

## 2021-01-22 DIAGNOSIS — Z0000 Encounter for general adult medical examination without abnormal findings: Secondary | ICD-10-CM

## 2021-01-22 NOTE — Progress Notes (Signed)
MEDICARE ANNUAL WELLNESS VISIT  01/22/2021  Telephone Visit Disclaimer This Medicare AWV was conducted by telephone due to national recommendations for restrictions regarding the COVID-19 Pandemic (e.g. social distancing).  I verified, using two identifiers, that I am speaking with Denise Meadows or their authorized healthcare agent. I discussed the limitations, risks, security, and privacy concerns of performing an evaluation and management service by telephone and the potential availability of an in-person appointment in the future. The patient expressed understanding and agreed to proceed.  Location of Patient: Home  Location of Provider (nurse):  [facility]  Subjective:    Denise Meadows is a 70 y.o. female patient of Denise Brooklyn, FNP who had a Medicare Annual Wellness Visit today via telephone. Denise Meadows is Retired and lives with their spouse. she has 2 children. she reports that she is socially active and does interact with friends/family regularly. she is minimally physically active due to being restricted from past back surgery and enjoys traveling.  Patient Care Team: Denise Brooklyn, FNP as PCP - General (Family Medicine) Denise Binder, MD (Inactive) (Gastroenterology) Edythe Meadows, [facility] as Pharmacist (Pharmacist) Denise Labs, MD as Referring Physician (Optometry)  Advanced Directives 01/22/2021 02/08/2012  Does Patient Have a Medical Advance Directive? No Patient does not have advance directive  Would patient like information on creating a medical advance directive? No - Patient declined -  Pre-existing out of facility DNR order (yellow form or pink MOST form) - No    Hospital Utilization Over the Past 12 Months: # of hospitalizations or ER visits: 0 # of surgeries: 0  Review of Systems    Patient reports that her overall health is unchanged compared to last year.  Patient has no complaints or concerns today  Patient Reported  Readings (BP, Pulse, CBG, Weight, etc) none  Pain Assessment Pain : No/denies pain     Current Medications & Allergies (verified) Allergies as of 01/22/2021       Reactions   Codeine Nausea And Vomiting   Penicillins Rash        Medication List        Accurate as of January 22, 2021 10:33 AM. If you have any questions, ask your nurse or doctor.          calcium gluconate 500 MG tablet Take 1 tablet (500 mg total) 2 (two) times daily by mouth.   diazepam 2 MG tablet Commonly known as: Valium Take 1 tablet (2 mg total) by mouth every 6 (six) hours as needed for anxiety.   Fish Oil 1000 MG Caps Take 1 capsule (1,000 mg total) by mouth 2 (two) times daily.   glucose blood test strip Please dispense as OneTouch Ultra 2. Use as directed to monitor FSBS 1x weekly. Dx: E11.9.   metFORMIN 1000 MG tablet Commonly known as: GLUCOPHAGE Take 1 tablet (1,000 mg total) by mouth daily with breakfast.   metoprolol succinate 25 MG 24 hr tablet Commonly known as: TOPROL-XL Take 1 tablet (25 mg total) by mouth daily.   metroNIDAZOLE 0.75 % gel Commonly known as: METROGEL Apply 1 application topically as needed.   multivitamin with minerals Tabs tablet Take 1 tablet by mouth daily.   omeprazole 20 MG capsule Commonly known as: PRILOSEC Take 1 capsule (20 mg total) by mouth daily as needed.   onetouch ultrasoft lancets Use as directed to monitor FSBS 1x weekly. Dx: E11.9.   pioglitazone 30 MG tablet Commonly known  as: ACTOS Take 0.5 tablets (15 mg total) by mouth daily.   ramipril 10 MG capsule Commonly known as: ALTACE Take 1 capsule (10 mg total) by mouth daily.   simvastatin 20 MG tablet Commonly known as: ZOCOR Take 1 tablet (20 mg total) by mouth at bedtime.   vitamin B-12 100 MCG tablet Commonly known as: CYANOCOBALAMIN Take 1 tablet (100 mcg total) by mouth daily.   Vitamin D 50 MCG (2000 UT) tablet Take 1 tablet (2,000 Units total) by mouth daily.         History (reviewed): Past Medical History:  Diagnosis Date   Complication of anesthesia    DDD (degenerative disc disease)    Diabetes mellitus    Diastolic dysfunction    Elevated liver function tests    eval by dr Leonides Grills to fatty liver 2008   GERD (gastroesophageal reflux disease)    Hiatal hernia    Hyperlipidemia    Hypertension    NASH (nonalcoholic steatohepatitis)    Nephrolithiasis    PONV (postoperative nausea and vomiting)    heel surgery   Pulmonary nodule    no change-1995   Rosacea    Ulcer    gastric ulcer, antritis-egd 09/06/08   Vitamin D deficiency    Past Surgical History:  Procedure Laterality Date   BREAST SURGERY     benign breast nodules 2005   CHOLECYSTECTOMY     COLONOSCOPY  2008   Dr Medoff-Normal   DILATION AND CURETTAGE, DIAGNOSTIC / THERAPEUTIC     ESOPHAGOGASTRODUODENOSCOPY     FRACTURE SURGERY     MALONEY DILATION  02/08/2012   Procedure: MALONEY DILATION;  Surgeon: Denise Binder, MD;  [facility];  Service: Endoscopy;  Laterality: N/A;   SAVORY DILATION  02/08/2012   Procedure: SAVORY DILATION;  Surgeon: Denise Binder, MD;  [facility];  Service: Endoscopy;  Laterality: N/A;   SPINE SURGERY     Family History  Problem Relation Age of Onset   Hyperlipidemia Mother    Osteoarthritis Mother    COPD Father    Scoliosis Son    Colon cancer Maternal Aunt    Colon cancer Maternal Grandfather    Heart disease Neg Hx    Social History   Socioeconomic History   Marital status: Married    Spouse name: Not on file   Number of children: 2   Years of education: Not on file   Highest education level: Not on file  Occupational History   Occupation: homemaker  Tobacco Use   Smoking status: Former    Types: Cigarettes    Quit date: 07/06/1988    Years since quitting: 32.5   Smokeless tobacco: Never   Tobacco comments:    intermittent for a few months  Vaping Use   Vaping Use: Never used  Substance and  Sexual Activity   Alcohol use: Not Currently    Comment: occasional mixed drink couple per mo   Drug use: No   Sexual activity: Not on file  Other Topics Concern   Not on file  Social History Narrative   Lives w/ husband   Social Determinants of Health   Financial Resource Strain: Not on file  Food Insecurity: Not on file  Transportation Needs: Not on file  Physical Activity: Not on file  Stress: Not on file  Social Connections: Not on file    Activities of Daily Living In your present state of health, do you have any difficulty performing  the following activities: 01/22/2021  Hearing? Y  Comment At times  Vision? Y  Comment Wears glasses when reads and has trouble seeing far away  Difficulty concentrating or making decisions? N  Walking or climbing stairs? Y  Comment climbing steps due to past injury in left heel  Dressing or bathing? N  Doing errands, shopping? N  Preparing Food and eating ? N  Using the Toilet? N  In the past six months, have you accidently leaked urine? N  Do you have problems with loss of bowel control? N  Managing your Medications? N  Managing your Finances? N  Housekeeping or managing your Housekeeping? N  Some recent data might be hidden    Patient Education/ Literacy How often do you need to have someone help you when you read instructions, pamphlets, or other written materials from your doctor or pharmacy?: 1 - Never What is the last grade level you completed in school?: 12th  Exercise Current Exercise Habits: Home exercise routine, Type of exercise: walking, Time (Minutes): 30, Frequency (Times/Week): 3, Weekly Exercise (Minutes/Week): 90  Diet Patient reports consuming 3 meals a day and 2 snack(s) a day Patient reports that her primary diet is: Regular Patient reports that she does have regular access to food.   Depression Screen PHQ 2/9 Scores 01/22/2021 10/23/2020 11/21/2019 07/20/2018 01/12/2018 07/14/2017 05/24/2017  PHQ - 2 Score 0 0 0  0 0 0 3  PHQ- 9 Score - 1 - - - 0 9     Fall Risk Fall Risk  01/22/2021 10/23/2020 11/21/2019 07/20/2018 01/12/2018  Falls in the past year? 0 1 0 0 Yes  Number falls in past yr: 0 0 - - 1  Injury with Fall? 0 1 - - No  Comment - - - - -  Risk for fall due to : No Fall Risks - No Fall Risks - -  Follow up Falls prevention discussed Falls prevention discussed Falls evaluation completed Falls evaluation completed -     Objective:  Denise Meadows seemed alert and oriented and she participated appropriately during our telephone visit.  Blood Pressure Weight BMI  BP Readings from Last 3 Encounters:  10/23/20 139/68  04/17/20 128/66  11/21/19 126/80   Wt Readings from Last 3 Encounters:  10/23/20 184 lb 9.6 oz (83.7 kg)  04/17/20 185 lb (83.9 kg)  11/21/19 191 lb (86.6 kg)   BMI Readings from Last 1 Encounters:  10/23/20 36.05 kg/m    *Unable to obtain current vital signs, weight, and BMI due to telephone visit type  Hearing/Vision  Zyrah did not seem to have difficulty with hearing/understanding during the telephone conversation Reports that she has not had a formal eye exam by an eye care professional within the past year Reports that she has not had a formal hearing evaluation within the past year *Unable to fully assess hearing and vision during telephone visit type  Cognitive Function: 6CIT Screen 01/22/2021  What Year? 0 points  What month? 0 points  What time? 0 points  Count back from 20 0 points  Months in reverse 0 points  Repeat phrase 0 points  Total Score 0   (Normal:0-7, Significant for Dysfunction: >8)  Normal Cognitive Function Screening: Yes   Immunization & Health Maintenance Record Immunization History  Administered Date(s) Administered   Influenza, High Dose Seasonal PF 04/24/2017   Influenza,inj,Quad PF,6+ Mos 04/08/2016   Influenza-Unspecified 04/24/2017, 04/05/2018   PFIZER(Purple Top)SARS-COV-2 Vaccination 07/26/2019, 08/15/2019, 04/12/2020    Pneumococcal Conjugate-13 06/03/2016  Pneumococcal Polysaccharide-23 06/07/2017   Tdap 09/20/2014   Zoster, Live 03/25/2015    Health Maintenance  Topic Date Due   Zoster Vaccines- Shingrix (1 of 2) Never done   OPHTHALMOLOGY EXAM  03/23/2020   COVID-19 Vaccine (4 - Booster for Pfizer series) 07/13/2020   MAMMOGRAM  08/18/2020   FOOT EXAM  11/20/2020   INFLUENZA VACCINE  02/03/2021   HEMOGLOBIN A1C  04/24/2021   COLONOSCOPY (Pts 45-56yrs Insurance coverage will need to be confirmed)  02/07/2022   TETANUS/TDAP  09/19/2024   DEXA SCAN  Completed   Hepatitis C Screening  Completed   PNA vac Low Risk Adult  Completed   HPV VACCINES  Aged Out       Assessment  This is a routine wellness examination for Denise Meadows.  Health Maintenance: Due or Overdue Health Maintenance Due  Topic Date Due   Zoster Vaccines- Shingrix (1 of 2) Never done   OPHTHALMOLOGY EXAM  03/23/2020   COVID-19 Vaccine (4 - Booster for [place] series) 07/13/2020   MAMMOGRAM  08/18/2020   FOOT EXAM  11/20/2020    Santa Lighter Ramberg does not need a referral for Community Assistance: Care Management:   no Social Work:    no Prescription Assistance:  no Nutrition/Diabetes Education:  no   Plan:  Personalized Goals  Goals Addressed             This Visit's Progress    AWV       01/22/2021 AWV Goal: Diabetes Management  Patient will maintain an A1C level below 8.0 Patient will not develop any diabetic foot complications Patient will not experience any hypoglycemic episodes over the next 3 months Patient will notify our office of any CBG readings outside of the provider recommended range by calling [phone] Patient will adhere to provider recommendations for diabetes management  Patient Self Management Activities take all medications as prescribed and report any negative side effects monitor and record blood sugar readings as directed adhere to a low carbohydrate diet that incorporates lean  proteins, vegetables, whole grains, low glycemic fruits check feet daily noting any sores, cracks, injuries, or callous formations see PCP or podiatrist if she notices any changes in her legs, feet, or toenails Patient will visit PCP and have an A1C level checked every 3 to 6 months as directed  have a yearly eye exam to monitor for vascular changes associated with diabetes and will request that the report be sent to her pcp.  consult with her PCP regarding any changes in her health or new or worsening symptoms        Personalized Health Maintenance & Screening Recommendations  Bone densitometry screening Eye Exam  Hearing Exam Diabetic Foot Exam  Lung Cancer Screening Recommended: no (Low Dose CT Chest recommended if Age 4-80 years, 30 pack-year currently smoking OR have quit w/in past 15 years) Hepatitis C Screening recommended: no HIV Screening recommended: no  Advanced Directives: Written information was not prepared per patient's request.  Referrals & Orders No orders of the defined types were placed in this encounter.   Follow-up Plan Follow-up with Denise Brooklyn, FNP as planned Schedule dexa scan, eye exam, and hearing exam. Foot exam will be completed at next appointment  AVS printed and mailed to patient    I have personally reviewed and noted the following in the patient's chart:   Medical and social history Use of alcohol, tobacco or illicit drugs  Current medications and supplements Functional ability and status Nutritional  status Physical activity Advanced directives List of other physicians Hospitalizations, surgeries, and ER visits in previous 12 months Vitals Screenings to include cognitive, depression, and falls Referrals and appointments  In addition, I have reviewed and discussed with Denise Meadows certain preventive protocols, quality metrics, and best practice recommendations. A written personalized care plan for preventive services as well as  general preventive health recommendations is available and can be mailed to the patient at her request.      Lynnea Ferrier, LPN  01/30/6183

## 2021-01-22 NOTE — Patient Instructions (Addendum)
  [place] Maintenance Summary and Written Plan of Care  Ms. Nishiyama ,  Thank you for allowing me to perform your Medicare Annual Wellness Visit and for your ongoing commitment to your health.   Health Maintenance & Immunization History Health Maintenance  Topic Date Due   Zoster Vaccines- Shingrix (1 of 2) Never done   DEXA SCAN  05/15/2019   OPHTHALMOLOGY EXAM  03/23/2020   COVID-19 Vaccine (4 - Booster for Pfizer series) 07/13/2020   MAMMOGRAM  08/18/2020   FOOT EXAM  11/20/2020   INFLUENZA VACCINE  02/03/2021   HEMOGLOBIN A1C  04/24/2021   COLONOSCOPY (Pts 45-42yrs Insurance coverage will need to be confirmed)  02/07/2022   TETANUS/TDAP  09/19/2024   Hepatitis C Screening  Completed   PNA vac Low Risk Adult  Completed   HPV VACCINES  Aged Out   Immunization History  Administered Date(s) Administered   Influenza, High Dose Seasonal PF 04/24/2017   Influenza,inj,Quad PF,6+ Mos 04/08/2016   Influenza-Unspecified 04/24/2017, 04/05/2018   PFIZER(Purple Top)SARS-COV-2 Vaccination 07/26/2019, 08/15/2019, 04/12/2020   Pneumococcal Conjugate-13 06/03/2016   Pneumococcal Polysaccharide-23 06/07/2017   Tdap 09/20/2014   Zoster, Live 03/25/2015    These are the patient goals that we discussed:  Goals Addressed             This Visit's Progress    AWV       01/22/2021 AWV Goal: Diabetes Management  Patient will maintain an A1C level below 8.0 Patient will not develop any diabetic foot complications Patient will not experience any hypoglycemic episodes over the next 3 months Patient will notify our office of any CBG readings outside of the provider recommended range by calling [phone] Patient will adhere to provider recommendations for diabetes management  Patient Self Management Activities take all medications as prescribed and report any negative side effects monitor and record blood sugar readings as directed adhere to a low  carbohydrate diet that incorporates lean proteins, vegetables, whole grains, low glycemic fruits check feet daily noting any sores, cracks, injuries, or callous formations see PCP or podiatrist if she notices any changes in her legs, feet, or toenails Patient will visit PCP and have an A1C level checked every 3 to 6 months as directed  have a yearly eye exam to monitor for vascular changes associated with diabetes and will request that the report be sent to her pcp.  consult with her PCP regarding any changes in her health or new or worsening symptoms          This is a list of Health Maintenance Items that are overdue or due now: Health Maintenance Due  Topic Date Due   Zoster Vaccines- Shingrix (1 of 2) Never done   DEXA SCAN  05/15/2019   OPHTHALMOLOGY EXAM  03/23/2020   COVID-19 Vaccine (4 - Booster for [place] series) 07/13/2020   MAMMOGRAM  08/18/2020   FOOT EXAM  11/20/2020     Orders/Referrals Placed Today: No orders of the defined types were placed in this encounter.  (Contact our referral department at [phone] if you have not spoken with someone about your referral appointment within the next 5 days)    Follow-up Plan  Follow-up with Loman Brooklyn, FNP as planned Schedule dexa scan, eye exam, and hearing exam. Foot exam will be completed at next appointment

## 2021-01-23 ENCOUNTER — Encounter: Payer: Self-pay | Admitting: Family Medicine

## 2021-01-23 ENCOUNTER — Other Ambulatory Visit: Payer: Self-pay

## 2021-01-23 ENCOUNTER — Ambulatory Visit (INDEPENDENT_AMBULATORY_CARE_PROVIDER_SITE_OTHER): Payer: Medicare Other | Admitting: Family Medicine

## 2021-01-23 VITALS — BP 124/65 | HR 72 | Temp 97.5°F | Ht 61.0 in | Wt 180.6 lb

## 2021-01-23 DIAGNOSIS — E782 Mixed hyperlipidemia: Secondary | ICD-10-CM | POA: Diagnosis not present

## 2021-01-23 DIAGNOSIS — E1165 Type 2 diabetes mellitus with hyperglycemia: Secondary | ICD-10-CM | POA: Diagnosis not present

## 2021-01-23 DIAGNOSIS — Z780 Asymptomatic menopausal state: Secondary | ICD-10-CM | POA: Diagnosis not present

## 2021-01-23 DIAGNOSIS — E669 Obesity, unspecified: Secondary | ICD-10-CM | POA: Diagnosis not present

## 2021-01-23 DIAGNOSIS — I10 Essential (primary) hypertension: Secondary | ICD-10-CM | POA: Diagnosis not present

## 2021-01-23 LAB — BAYER DCA HB A1C WAIVED: HB A1C (BAYER DCA - WAIVED): 6.1 % (ref <7.0)

## 2021-01-23 NOTE — Progress Notes (Signed)
Assessment & Plan:  1. Type 2 diabetes mellitus with hyperglycemia, without long-term current use of insulin (HCC) Lab Results  Component Value Date   HGBA1C 6.1 01/23/2021   HGBA1C 5.9 (H) 10/23/2020   HGBA1C 5.9 (H) 04/17/2020    - Diabetes is at goal of A1c < 7. - Medications:  continue Metformin. Stop Actos. - Home glucose monitoring: continue monitoring - Patient is currently taking a statin. Patient is taking an ACE-inhibitor/ARB.  - Instruction/counseling given: reminded to get eye exam, discussed foot care, discussed diet, and provided printed educational material  Diabetes Health Maintenance Due  Topic Date Due   OPHTHALMOLOGY EXAM  03/23/2020   HEMOGLOBIN A1C  07/26/2021   FOOT EXAM  01/23/2022    - Lipid panel - CBC with Differential/Platelet - CMP14+EGFR - Bayer DCA Hb A1c Waived  2. Primary hypertension Well controlled on current regimen.  - Lipid panel - CBC with Differential/Platelet - CMP14+EGFR  3. Mixed hyperlipidemia Well controlled on current regimen.  - Lipid panel - CMP14+EGFR  4. Obesity (BMI 30.0-34.9) Patient has lost 4 lbs in the past 3 months. Encouraged healthy eating and exercise. - Lipid panel - CBC with Differential/Platelet - CMP14+EGFR  5. Postmenopausal estrogen deficiency - DG [facility] DEXA; Future   Return in about 3 months (around 04/25/2021) for DM.  Hendricks Limes, MSN, APRN, FNP-C [facility]  Subjective:    Patient ID: Denise Meadows, female    DOB: 1951/04/18, 70 y.o.   MRN: [id number]  Patient Care Team: Loman Brooklyn, FNP as PCP - General (Family Medicine) Danie Binder, MD (Inactive) (Gastroenterology) Edythe Clarity, [facility] as Pharmacist (Pharmacist) Harlen Labs, MD as Referring Physician (Optometry)   Chief Complaint:  Chief Complaint  Patient presents with   Diabetes    3 month follow up     HPI: Denise Meadows is a 70 y.o. female presenting on 01/23/2021 for Diabetes (3  month follow up )  Patient presents for follow up of diabetes. Current symptoms include: none. Known diabetic complications: none. Medication compliance: Yes. Actis was decreased at her last visit from 30 mg to 15 mg in an effort to get her off of it. Current diet:  some healthy, some cheating . Current exercise: walking. Home blood sugar records: BGs range between 90 and 120 . Is she  on ACE inhibitor or angiotensin II receptor blocker? Yes. Is she on a statin? Yes.   New complaints: None   Social history:  Relevant past medical, surgical, family and social history reviewed and updated as indicated. Interim medical history since our last visit reviewed.  Allergies and medications reviewed and updated.  DATA REVIEWED: CHART IN EPIC  ROS: Negative unless specifically indicated above in HPI.    Current Outpatient Medications:    calcium gluconate 500 MG tablet, Take 1 tablet (500 mg total) 2 (two) times daily by mouth., Disp: 60 tablet, Rfl: 11   Cholecalciferol (VITAMIN D) 2000 units tablet, Take 1 tablet (2,000 Units total) by mouth daily., Disp: 90 tablet, Rfl: 2   diazepam (VALIUM) 2 MG tablet, Take 1 tablet (2 mg total) by mouth every 6 (six) hours as needed for anxiety., Disp: 20 tablet, Rfl: 0   glucose blood test strip, Please dispense as OneTouch Ultra 2. Use as directed to monitor FSBS 1x weekly. Dx: E11.9., Disp: 100 each, Rfl: 3   Lancets (ONETOUCH ULTRASOFT) lancets, Use as directed to monitor FSBS 1x weekly. Dx: E11.9., Disp: 100  each, Rfl: 3   metFORMIN (GLUCOPHAGE) 1000 MG tablet, Take 1 tablet (1,000 mg total) by mouth daily with breakfast., Disp: 90 tablet, Rfl: 1   metoprolol succinate (TOPROL-XL) 25 MG 24 hr tablet, Take 1 tablet (25 mg total) by mouth daily., Disp: 90 tablet, Rfl: 1   metroNIDAZOLE (METROGEL) 0.75 % gel, Apply 1 application topically as needed., Disp: , Rfl:    Multiple Vitamin (MULTIVITAMIN WITH MINERALS) TABS tablet, Take 1 tablet by mouth daily.,  Disp: , Rfl:    Omega-3 Fatty Acids (FISH OIL) 1000 MG CAPS, Take 1 capsule (1,000 mg total) by mouth 2 (two) times daily., Disp: , Rfl: 0   omeprazole (PRILOSEC) 20 MG capsule, Take 1 capsule (20 mg total) by mouth daily as needed., Disp: 90 capsule, Rfl: 1   ramipril (ALTACE) 10 MG capsule, Take 1 capsule (10 mg total) by mouth daily., Disp: 90 capsule, Rfl: 1   simvastatin (ZOCOR) 20 MG tablet, Take 1 tablet (20 mg total) by mouth at bedtime., Disp: 90 tablet, Rfl: 1   vitamin B-12 (CYANOCOBALAMIN) 100 MCG tablet, Take 1 tablet (100 mcg total) by mouth daily., Disp: , Rfl:    Allergies  Allergen Reactions   Codeine Nausea And Vomiting   Penicillins Rash   Past Medical History:  Diagnosis Date   Complication of anesthesia    DDD (degenerative disc disease)    Diabetes mellitus    Diastolic dysfunction    Elevated liver function tests    eval by dr Leonides Grills to fatty liver 2008   GERD (gastroesophageal reflux disease)    Hiatal hernia    Hyperlipidemia    Hypertension    NASH (nonalcoholic steatohepatitis)    Nephrolithiasis    PONV (postoperative nausea and vomiting)    heel surgery   Pulmonary nodule    no change-1995   Rosacea    Ulcer    gastric ulcer, antritis-egd 09/06/08   Vitamin D deficiency     Past Surgical History:  Procedure Laterality Date   BREAST SURGERY     benign breast nodules 2005   CHOLECYSTECTOMY     COLONOSCOPY  2008   Dr Medoff-Normal   DILATION AND CURETTAGE, DIAGNOSTIC / THERAPEUTIC     ESOPHAGOGASTRODUODENOSCOPY     FRACTURE SURGERY     MALONEY DILATION  02/08/2012   Procedure: MALONEY DILATION;  Surgeon: Danie Binder, MD;  [facility];  Service: Endoscopy;  Laterality: N/A;   SAVORY DILATION  02/08/2012   Procedure: SAVORY DILATION;  Surgeon: Danie Binder, MD;  [facility];  Service: Endoscopy;  Laterality: N/A;   SPINE SURGERY      Social History   Socioeconomic History   Marital status: Married     Spouse name: Not on file   Number of children: 2   Years of education: Not on file   Highest education level: Not on file  Occupational History   Occupation: homemaker  Tobacco Use   Smoking status: Former    Types: Cigarettes    Quit date: 07/06/1988    Years since quitting: 32.5   Smokeless tobacco: Never   Tobacco comments:    intermittent for a few months  Vaping Use   Vaping Use: Never used  Substance and Sexual Activity   Alcohol use: Not Currently    Comment: occasional mixed drink couple per mo   Drug use: No   Sexual activity: Not on file  Other Topics Concern   Not on file  Social History Narrative   Lives w/ husband   Social Determinants of Health   Financial Resource Strain: Not on file  Food Insecurity: Not on file  Transportation Needs: Not on file  Physical Activity: Not on file  Stress: Not on file  Social Connections: Not on file  Intimate Partner Violence: Not on file        Objective:    BP 124/65   Pulse 72   Temp (!) 97.5 F (36.4 C) (Temporal)   Ht 5' 1" (1.549 m)   Wt 180 lb 9.6 oz (81.9 kg)   SpO2 97%   BMI 34.12 kg/m   Wt Readings from Last 3 Encounters:  01/23/21 180 lb 9.6 oz (81.9 kg)  10/23/20 184 lb 9.6 oz (83.7 kg)  04/17/20 185 lb (83.9 kg)    Physical Exam Vitals reviewed.  Constitutional:      General: She is not in acute distress.    Appearance: Normal appearance. She is obese. She is not ill-appearing, toxic-appearing or diaphoretic.  HENT:     Head: Normocephalic and atraumatic.  Eyes:     General: No scleral icterus.       Right eye: No discharge.        Left eye: No discharge.     Conjunctiva/sclera: Conjunctivae normal.  Cardiovascular:     Rate and Rhythm: Normal rate and regular rhythm.     Heart sounds: Normal heart sounds. No murmur heard.   No friction rub. No gallop.  Pulmonary:     Effort: Pulmonary effort is normal. No respiratory distress.     Breath sounds: Normal breath sounds. No stridor. No  wheezing, rhonchi or rales.  Musculoskeletal:        General: Normal range of motion.     Cervical back: Normal range of motion.  Skin:    General: Skin is warm and dry.     Capillary Refill: Capillary refill takes less than 2 seconds.  Neurological:     General: No focal deficit present.     Mental Status: She is alert and oriented to person, place, and time. Mental status is at baseline.  Psychiatric:        Mood and Affect: Mood normal.        Behavior: Behavior normal.        Thought Content: Thought content normal.        Judgment: Judgment normal.    Lab Results  Component Value Date   TSH 2.69 07/20/2018   Lab Results  Component Value Date   WBC 6.3 10/23/2020   HGB 12.8 10/23/2020   HCT 39.2 10/23/2020   MCV 96 10/23/2020   PLT 295 10/23/2020   Lab Results  Component Value Date   NA 137 10/23/2020   K 4.8 10/23/2020   CO2 24 10/23/2020   GLUCOSE 103 (H) 10/23/2020   BUN 23 10/23/2020   CREATININE 1.16 (H) 10/23/2020   BILITOT 0.3 10/23/2020   ALKPHOS 87 10/23/2020   AST 23 10/23/2020   ALT 15 10/23/2020   PROT 7.3 10/23/2020   ALBUMIN 4.5 10/23/2020   CALCIUM 9.9 10/23/2020   EGFR 51 (L) 10/23/2020   Lab Results  Component Value Date   CHOL 191 10/23/2020   Lab Results  Component Value Date   HDL 51 10/23/2020   Lab Results  Component Value Date   LDLCALC 112 (H) 10/23/2020   Lab Results  Component Value Date   TRIG 161 (H) 10/23/2020   Lab Results  Component Value Date   CHOLHDL 3.7 10/23/2020   Lab Results  Component Value Date   HGBA1C 5.9 (H) 10/23/2020

## 2021-01-24 ENCOUNTER — Encounter: Payer: Self-pay | Admitting: Cardiology

## 2021-01-24 LAB — CMP14+EGFR
ALT: 15 IU/L (ref 0–32)
AST: 21 IU/L (ref 0–40)
Albumin/Globulin Ratio: 1.8 (ref 1.2–2.2)
Albumin: 4.5 g/dL (ref 3.8–4.8)
Alkaline Phosphatase: 83 IU/L (ref 44–121)
BUN/Creatinine Ratio: 21 (ref 12–28)
BUN: 24 mg/dL (ref 8–27)
Bilirubin Total: 0.3 mg/dL (ref 0.0–1.2)
CO2: 24 mmol/L (ref 20–29)
Calcium: 9.9 mg/dL (ref 8.7–10.3)
Chloride: 104 mmol/L (ref 96–106)
Creatinine, Ser: 1.13 mg/dL — ABNORMAL HIGH (ref 0.57–1.00)
Globulin, Total: 2.5 g/dL (ref 1.5–4.5)
Glucose: 105 mg/dL — ABNORMAL HIGH (ref 65–99)
Potassium: 4.5 mmol/L (ref 3.5–5.2)
Sodium: 143 mmol/L (ref 134–144)
Total Protein: 7 g/dL (ref 6.0–8.5)
eGFR: 53 mL/min/{1.73_m2} — ABNORMAL LOW (ref >59)

## 2021-01-24 LAB — CBC WITH DIFFERENTIAL/PLATELET
Basophils Absolute: 0 10*3/uL (ref 0.0–0.2)
Basos: 1 %
EOS (ABSOLUTE): 0.2 10*3/uL (ref 0.0–0.4)
Eos: 5 %
Hematocrit: 38.4 % (ref 34.0–46.6)
Hemoglobin: 12.7 g/dL (ref 11.1–15.9)
Immature Grans (Abs): 0 10*3/uL (ref 0.0–0.1)
Immature Granulocytes: 0 %
Lymphocytes Absolute: 1.8 10*3/uL (ref 0.7–3.1)
Lymphs: 37 %
MCH: 31.2 pg (ref 26.6–33.0)
MCHC: 33.1 g/dL (ref 31.5–35.7)
MCV: 94 fL (ref 79–97)
Monocytes Absolute: 0.6 10*3/uL (ref 0.1–0.9)
Monocytes: 13 %
Neutrophils Absolute: 2.2 10*3/uL (ref 1.4–7.0)
Neutrophils: 44 %
Platelets: 260 10*3/uL (ref 150–450)
RBC: 4.07 x10E6/uL (ref 3.77–5.28)
RDW: 12.3 % (ref 11.7–15.4)
WBC: 4.9 10*3/uL (ref 3.4–10.8)

## 2021-01-24 LAB — LIPID PANEL
Chol/HDL Ratio: 2.9 ratio (ref 0.0–4.4)
Cholesterol, Total: 151 mg/dL (ref 100–199)
HDL: 52 mg/dL (ref >39)
LDL Chol Calc (NIH): 75 mg/dL (ref 0–99)
Triglycerides: 138 mg/dL (ref 0–149)
VLDL Cholesterol Cal: 24 mg/dL (ref 5–40)

## 2021-01-29 ENCOUNTER — Other Ambulatory Visit: Payer: Self-pay

## 2021-01-29 ENCOUNTER — Ambulatory Visit (INDEPENDENT_AMBULATORY_CARE_PROVIDER_SITE_OTHER): Payer: Medicare Other

## 2021-01-29 ENCOUNTER — Ambulatory Visit
Admission: RE | Admit: 2021-01-29 | Discharge: 2021-01-29 | Disposition: A | Discharge status: Home / Self Care | Payer: Medicare Other | Source: Ambulatory Visit | Attending: Family Medicine | Admitting: Family Medicine

## 2021-01-29 DIAGNOSIS — Z780 Asymptomatic menopausal state: Secondary | ICD-10-CM | POA: Diagnosis not present

## 2021-01-29 DIAGNOSIS — M810 Age-related osteoporosis without current pathological fracture: Secondary | ICD-10-CM

## 2021-01-29 DIAGNOSIS — Z1231 Encounter for screening mammogram for malignant neoplasm of breast: Secondary | ICD-10-CM

## 2021-01-29 DIAGNOSIS — M8588 Other specified disorders of bone density and structure, other site: Secondary | ICD-10-CM | POA: Diagnosis not present

## 2021-01-29 HISTORY — DX: Age-related osteoporosis without current pathological fracture: M81.0

## 2021-01-30 ENCOUNTER — Encounter: Payer: Self-pay | Admitting: Family Medicine

## 2021-02-17 ENCOUNTER — Ambulatory Visit: Payer: Medicare Other | Admitting: Cardiology

## 2021-03-06 ENCOUNTER — Ambulatory Visit (HOSPITAL_BASED_OUTPATIENT_CLINIC_OR_DEPARTMENT_OTHER): Payer: Medicare Other | Admitting: Family

## 2021-04-03 ENCOUNTER — Other Ambulatory Visit: Payer: Self-pay

## 2021-04-03 ENCOUNTER — Ambulatory Visit (HOSPITAL_BASED_OUTPATIENT_CLINIC_OR_DEPARTMENT_OTHER): Payer: Medicare Other | Admitting: Family

## 2021-04-03 ENCOUNTER — Ambulatory Visit (INDEPENDENT_AMBULATORY_CARE_PROVIDER_SITE_OTHER): Payer: Medicare Other | Admitting: Family Medicine

## 2021-04-03 ENCOUNTER — Encounter: Payer: Self-pay | Admitting: Family Medicine

## 2021-04-03 ENCOUNTER — Ambulatory Visit (INDEPENDENT_AMBULATORY_CARE_PROVIDER_SITE_OTHER): Payer: Medicare Other

## 2021-04-03 VITALS — BP 126/86 | HR 82 | Temp 97.6°F | Ht 61.0 in | Wt 177.1 lb

## 2021-04-03 DIAGNOSIS — M25531 Pain in right wrist: Secondary | ICD-10-CM | POA: Diagnosis not present

## 2021-04-03 DIAGNOSIS — W19XXXA Unspecified fall, initial encounter: Secondary | ICD-10-CM | POA: Diagnosis not present

## 2021-04-03 DIAGNOSIS — Z23 Encounter for immunization: Secondary | ICD-10-CM | POA: Diagnosis not present

## 2021-04-03 NOTE — Progress Notes (Signed)
Acute Office Visit  Subjective:    Patient ID: Denise Meadows, female    DOB: 22-Jul-1950, 70 y.o.   MRN: [id number]  Chief Complaint  Patient presents with   Fall    HPI Patient is in today for a fall that occurred yesterday. She was walking into a restaurant when the tripped over a rolled up rug. She pushed a table out of her way with her hands on the way down and then landed flat on her stomach on the floor. She did not hit her head. Denies LOC. She did have some swelling to the inside of her right wrist yesterday although this has resolved. She reports bruising to the inside of her wrist. The area is tender if touch, otherwise she does not have much pain. She has full ROM and denies numbness or tingling. She has been taking ibuprofen as needed. She has seen an orthopedic Aline Brochure) for a fracture in her left hand. She would like to see him again if needed.   Past Medical History:  Diagnosis Date   Complication of anesthesia    DDD (degenerative disc disease)    Diabetes mellitus    Diastolic dysfunction    Elevated liver function tests    eval by dr Leonides Grills to fatty liver 2008   GERD (gastroesophageal reflux disease)    Hiatal hernia    Hyperlipidemia    Hypertension    NASH (nonalcoholic steatohepatitis)    Nephrolithiasis    Osteoporosis 01/29/2021   PONV (postoperative nausea and vomiting)    heel surgery   Pulmonary nodule    no change-1995   Rosacea    Ulcer    gastric ulcer, antritis-egd 09/06/08   Vitamin D deficiency     Past Surgical History:  Procedure Laterality Date   BREAST CYST ASPIRATION Left 2005   BREAST SURGERY     benign breast nodules 2005   CHOLECYSTECTOMY     COLONOSCOPY  2008   Dr Medoff-Normal   DILATION AND CURETTAGE, DIAGNOSTIC / THERAPEUTIC     ESOPHAGOGASTRODUODENOSCOPY     FRACTURE SURGERY     MALONEY DILATION  02/08/2012   Procedure: MALONEY DILATION;  Surgeon: Danie Binder, MD;  [facility];  Service:  Endoscopy;  Laterality: N/A;   SAVORY DILATION  02/08/2012   Procedure: SAVORY DILATION;  Surgeon: Danie Binder, MD;  [facility];  Service: Endoscopy;  Laterality: N/A;   SPINE SURGERY      Family History  Problem Relation Age of Onset   Hyperlipidemia Mother    Osteoarthritis Mother    COPD Father    Colon cancer Maternal Aunt    Colon cancer Maternal Grandfather    Scoliosis Son    Heart disease Neg Hx    Breast cancer Neg Hx     Social History   Socioeconomic History   Marital status: Married    Spouse name: Not on file   Number of children: 2   Years of education: Not on file   Highest education level: Not on file  Occupational History   Occupation: homemaker  Tobacco Use   Smoking status: Former    Types: Cigarettes    Quit date: 07/06/1988    Years since quitting: 32.7   Smokeless tobacco: Never   Tobacco comments:    intermittent for a few months  Vaping Use   Vaping Use: Never used  Substance and Sexual Activity   Alcohol use: Not Currently  Comment: occasional mixed drink couple per mo   Drug use: No   Sexual activity: Not on file  Other Topics Concern   Not on file  Social History Narrative   Lives w/ husband   Social Determinants of Health   Financial Resource Strain: Not on file  Food Insecurity: Not on file  Transportation Needs: Not on file  Physical Activity: Not on file  Stress: Not on file  Social Connections: Not on file  Intimate Partner Violence: Not on file    Outpatient Medications Prior to Visit  Medication Sig Dispense Refill   calcium gluconate 500 MG tablet Take 1 tablet (500 mg total) 2 (two) times daily by mouth. 60 tablet 11   Cholecalciferol (VITAMIN D) 2000 units tablet Take 1 tablet (2,000 Units total) by mouth daily. 90 tablet 2   diazepam (VALIUM) 2 MG tablet Take 1 tablet (2 mg total) by mouth every 6 (six) hours as needed for anxiety. 20 tablet 0   glucose blood test strip Please dispense as OneTouch  Ultra 2. Use as directed to monitor FSBS 1x weekly. Dx: E11.9. 100 each 3   Lancets (ONETOUCH ULTRASOFT) lancets Use as directed to monitor FSBS 1x weekly. Dx: E11.9. 100 each 3   metFORMIN (GLUCOPHAGE) 1000 MG tablet Take 1 tablet (1,000 mg total) by mouth daily with breakfast. 90 tablet 1   metoprolol succinate (TOPROL-XL) 25 MG 24 hr tablet Take 1 tablet (25 mg total) by mouth daily. 90 tablet 1   metroNIDAZOLE (METROGEL) 0.75 % gel Apply 1 application topically as needed.     Multiple Vitamin (MULTIVITAMIN WITH MINERALS) TABS tablet Take 1 tablet by mouth daily.     Omega-3 Fatty Acids (FISH OIL) 1000 MG CAPS Take 1 capsule (1,000 mg total) by mouth 2 (two) times daily.  0   omeprazole (PRILOSEC) 20 MG capsule Take 1 capsule (20 mg total) by mouth daily as needed. 90 capsule 1   ramipril (ALTACE) 10 MG capsule Take 1 capsule (10 mg total) by mouth daily. 90 capsule 1   simvastatin (ZOCOR) 20 MG tablet Take 1 tablet (20 mg total) by mouth at bedtime. 90 tablet 1   vitamin B-12 (CYANOCOBALAMIN) 100 MCG tablet Take 1 tablet (100 mcg total) by mouth daily.     No facility-administered medications prior to visit.    Allergies  Allergen Reactions   Codeine Nausea And Vomiting   Penicillins Rash    Review of Systems As per HPI.    Objective:    Physical Exam Vitals and nursing note reviewed.  Constitutional:      General: She is not in acute distress.    Appearance: She is not ill-appearing, toxic-appearing or diaphoretic.  Pulmonary:     Effort: Pulmonary effort is normal. No respiratory distress.  Musculoskeletal:     Right wrist: Tenderness and bony tenderness (radial aspect of palmar side) present. No swelling, deformity, effusion, lacerations, snuff box tenderness or crepitus. Normal range of motion.     Comments: Bruising to palmer aspect of right wrist.  Skin:    General: Skin is warm and dry.  Neurological:     Mental Status: She is alert and oriented to person, place, and  time.  Psychiatric:        Mood and Affect: Mood normal.        Behavior: Behavior normal.    BP 126/86   Pulse 82   Temp 97.6 F (36.4 C) (Temporal)   Ht _0  (1.549 m)  Wt 177 lb 2 oz (80.3 kg)   BMI 33.47 kg/m  Wt Readings from Last 3 Encounters:  04/03/21 177 lb 2 oz (80.3 kg)  01/23/21 180 lb 9.6 oz (81.9 kg)  10/23/20 184 lb 9.6 oz (83.7 kg)    Health Maintenance Due  Topic Date Due   OPHTHALMOLOGY EXAM  03/23/2020   COVID-19 Vaccine (4 - Booster for Pfizer series) 07/05/2020    There are no preventive care reminders to display for this patient.   Lab Results  Component Value Date   TSH 2.69 07/20/2018   Lab Results  Component Value Date   WBC 4.9 01/23/2021   HGB 12.7 01/23/2021   HCT 38.4 01/23/2021   MCV 94 01/23/2021   PLT 260 01/23/2021   Lab Results  Component Value Date   NA 143 01/23/2021   K 4.5 01/23/2021   CO2 24 01/23/2021   GLUCOSE 105 (H) 01/23/2021   BUN 24 01/23/2021   CREATININE 1.13 (H) 01/23/2021   BILITOT 0.3 01/23/2021   ALKPHOS 83 01/23/2021   AST 21 01/23/2021   ALT 15 01/23/2021   PROT 7.0 01/23/2021   ALBUMIN 4.5 01/23/2021   CALCIUM 9.9 01/23/2021   EGFR 53 (L) 01/23/2021   Lab Results  Component Value Date   CHOL 151 01/23/2021   Lab Results  Component Value Date   HDL 52 01/23/2021   Lab Results  Component Value Date   LDLCALC 75 01/23/2021   Lab Results  Component Value Date   TRIG 138 01/23/2021   Lab Results  Component Value Date   CHOLHDL 2.9 01/23/2021   Lab Results  Component Value Date   HGBA1C 6.1 01/23/2021       Assessment & Plan:   Solange was seen today for fall.  Diagnoses and all orders for this visit:  Acute pain of right wrist Fall, initial encounter Xray today in office, radiology report pending. Will notify patient of results. Discussed NSAIDs and RICE therapy for treatment.  -     DG Wrist Complete Right; Future  Need for immunization against influenza -     Flu Vaccine  QUAD High Dose(Fluad)   Return to office for new or worsening symptoms, or if symptoms persist.   The patient indicates understanding of these issues and agrees with the plan.  Gwenlyn Perking, FNP

## 2021-04-25 ENCOUNTER — Other Ambulatory Visit: Payer: Self-pay | Admitting: Family Medicine

## 2021-04-25 DIAGNOSIS — E782 Mixed hyperlipidemia: Secondary | ICD-10-CM

## 2021-04-25 DIAGNOSIS — I10 Essential (primary) hypertension: Secondary | ICD-10-CM

## 2021-04-25 DIAGNOSIS — E1165 Type 2 diabetes mellitus with hyperglycemia: Secondary | ICD-10-CM

## 2021-04-29 ENCOUNTER — Ambulatory Visit: Payer: Medicare Other | Admitting: Family Medicine

## 2021-05-01 ENCOUNTER — Ambulatory Visit (HOSPITAL_BASED_OUTPATIENT_CLINIC_OR_DEPARTMENT_OTHER): Payer: Medicare Other | Admitting: Family

## 2021-05-14 ENCOUNTER — Encounter: Payer: Self-pay | Admitting: Family Medicine

## 2021-05-14 ENCOUNTER — Other Ambulatory Visit: Payer: Self-pay

## 2021-05-14 ENCOUNTER — Ambulatory Visit (INDEPENDENT_AMBULATORY_CARE_PROVIDER_SITE_OTHER): Payer: Medicare Other | Admitting: Family Medicine

## 2021-05-14 VITALS — BP 135/80 | HR 66 | Temp 97.2°F | Ht 61.0 in | Wt 175.4 lb

## 2021-05-14 DIAGNOSIS — E66811 Obesity, class 1: Secondary | ICD-10-CM

## 2021-05-14 DIAGNOSIS — E669 Obesity, unspecified: Secondary | ICD-10-CM

## 2021-05-14 DIAGNOSIS — N1831 Chronic kidney disease, stage 3a: Secondary | ICD-10-CM | POA: Diagnosis not present

## 2021-05-14 DIAGNOSIS — E1165 Type 2 diabetes mellitus with hyperglycemia: Secondary | ICD-10-CM | POA: Diagnosis not present

## 2021-05-14 DIAGNOSIS — M810 Age-related osteoporosis without current pathological fracture: Secondary | ICD-10-CM

## 2021-05-14 DIAGNOSIS — E782 Mixed hyperlipidemia: Secondary | ICD-10-CM | POA: Diagnosis not present

## 2021-05-14 DIAGNOSIS — I10 Essential (primary) hypertension: Secondary | ICD-10-CM | POA: Diagnosis not present

## 2021-05-14 DIAGNOSIS — I700 Atherosclerosis of aorta: Secondary | ICD-10-CM

## 2021-05-14 LAB — CBC WITH DIFFERENTIAL/PLATELET
Basophils Absolute: 0.1 10*3/uL (ref 0.0–0.2)
Basos: 1 %
EOS (ABSOLUTE): 0.2 10*3/uL (ref 0.0–0.4)
Eos: 4 %
Hematocrit: 40.2 % (ref 34.0–46.6)
Hemoglobin: 13.2 g/dL (ref 11.1–15.9)
Immature Grans (Abs): 0 10*3/uL (ref 0.0–0.1)
Immature Granulocytes: 0 %
Lymphocytes Absolute: 1.9 10*3/uL (ref 0.7–3.1)
Lymphs: 31 %
MCH: 31.1 pg (ref 26.6–33.0)
MCHC: 32.8 g/dL (ref 31.5–35.7)
MCV: 95 fL (ref 79–97)
Monocytes Absolute: 0.6 10*3/uL (ref 0.1–0.9)
Monocytes: 10 %
Neutrophils Absolute: 3.3 10*3/uL (ref 1.4–7.0)
Neutrophils: 54 %
Platelets: 261 10*3/uL (ref 150–450)
RBC: 4.24 x10E6/uL (ref 3.77–5.28)
RDW: 12.3 % (ref 11.7–15.4)
WBC: 6.1 10*3/uL (ref 3.4–10.8)

## 2021-05-14 LAB — LIPID PANEL
Chol/HDL Ratio: 3.2 ratio (ref 0.0–4.4)
Cholesterol, Total: 152 mg/dL (ref 100–199)
HDL: 48 mg/dL (ref >39)
LDL Chol Calc (NIH): 75 mg/dL (ref 0–99)
Triglycerides: 171 mg/dL — ABNORMAL HIGH (ref 0–149)
VLDL Cholesterol Cal: 29 mg/dL (ref 5–40)

## 2021-05-14 LAB — BAYER DCA HB A1C WAIVED: HB A1C (BAYER DCA - WAIVED): 6.3 % — ABNORMAL HIGH (ref 4.8–5.6)

## 2021-05-14 LAB — CMP14+EGFR
ALT: 14 IU/L (ref 0–32)
AST: 17 IU/L (ref 0–40)
Albumin/Globulin Ratio: 2.4 — ABNORMAL HIGH (ref 1.2–2.2)
Albumin: 4.7 g/dL (ref 3.8–4.8)
Alkaline Phosphatase: 95 IU/L (ref 44–121)
BUN/Creatinine Ratio: 16 (ref 12–28)
BUN: 18 mg/dL (ref 8–27)
Bilirubin Total: 0.4 mg/dL (ref 0.0–1.2)
CO2: 22 mmol/L (ref 20–29)
Calcium: 9.8 mg/dL (ref 8.7–10.3)
Chloride: 103 mmol/L (ref 96–106)
Creatinine, Ser: 1.12 mg/dL — ABNORMAL HIGH (ref 0.57–1.00)
Globulin, Total: 2 g/dL (ref 1.5–4.5)
Glucose: 115 mg/dL — ABNORMAL HIGH (ref 70–99)
Potassium: 4.7 mmol/L (ref 3.5–5.2)
Sodium: 139 mmol/L (ref 134–144)
Total Protein: 6.7 g/dL (ref 6.0–8.5)
eGFR: 53 mL/min/{1.73_m2} — ABNORMAL LOW (ref >59)

## 2021-05-14 MED ORDER — ASPIRIN 81 MG PO TBEC: 81.0000 mg | DELAYED_RELEASE_TABLET | Freq: Every day | ORAL | 12 refills | Status: DC

## 2021-05-14 NOTE — Progress Notes (Signed)
Assessment & Plan:  1. Type 2 diabetes mellitus with hyperglycemia, without long-term current use of insulin (HCC) Lab Results  Component Value Date   HGBA1C 6.1 01/23/2021   HGBA1C 5.9 (H) 10/23/2020   HGBA1C 5.9 (H) 04/17/2020  A1c 6.3 today - Diabetes is at goal of A1c < 7. - Medications: continue current medications - Home glucose monitoring: 99-125 - Patient is currently taking a statin. Patient is taking an ACE-inhibitor/ARB.  - Instruction/counseling given: reminded to get eye exam, discussed foot care, discussed diet, and provided printed educational material  Diabetes Health Maintenance Due  Topic Date Due   OPHTHALMOLOGY EXAM  03/23/2020   HEMOGLOBIN A1C  07/26/2021   FOOT EXAM  01/23/2022    Lab Results  Component Value Date   MICROALBUR 4.4 07/20/2018   MICROALBUR 1.2 04/08/2017   - Lipid panel - CBC with Differential/Platelet - CMP14+EGFR - Bayer DCA Hb A1c Waived - Microalbumin / creatinine urine ratio  2. Primary hypertension - controlled with metoprolol and ramipril - encouraged to take BP at home and keep a log.  - Lipid panel - CBC with Differential/Platelet - CMP14+EGFR  3. Mixed hyperlipidemia - continue statin and fish oil supplement - Lipid panel - CMP14+EGFR  4. Aortic atherosclerosis (HCC) - continue statin and fish oil supplement - Lipid panel  5. Stage 3a chronic kidney disease ([place]) - printed information about kidney disease provided - discussed adding Farxiga, patient is considering pending lab results from today  6. Age-related osteoporosis without current pathological fracture - printed information about osteoporosis provided - Discussed adding Fosamax, patient is considering  7. Obesity (BMI 30.0-34.9) - encouraged healthy diet and exercise   Return in about 4 months (around 09/11/2021) for annual physical.  Lucile Crater, NP Student  I personally was present during the history, physical exam, and medical decision-making  activities of this service and have verified that the service and findings are accurately documented in the nurse practitioner student's note.  Hendricks Limes, MSN, APRN, FNP-C [facility]   Subjective:    Patient ID: Denise Meadows, female    DOB: Dec 16, 1950, 70 y.o.   MRN: [id number]  Patient Care Team: Loman Brooklyn, FNP as PCP - General (Family Medicine) Danie Binder, MD (Inactive) (Gastroenterology) Edythe Clarity, [facility] as Pharmacist (Pharmacist) Harlen Labs, MD as Referring Physician (Optometry)   Chief Complaint:  Chief Complaint  Patient presents with   Diabetes    3 month follow up     HPI: Meadows Denise is a 70 y.o. female presenting on 05/14/2021 for Diabetes (3 month follow up )  Diabetes: Current symptoms include: none. Known diabetic complications: none. Medication compliance: Yes. Actos was discontinued at her last visit. Current diet:  some healthy, some cheating . Current exercise: walking. Home blood sugar records: BGs range between 90 and 120 . Is she  on ACE inhibitor or angiotensin II receptor blocker? Yes (Ramipril). Is she on a statin? Yes (Simvastatin).   Hypertension: She does not take her BP often at home, but when she does, she usually gets readings around 120/60. She is taking ramipril and metoprolol. She has an appointment with her cardiologist on 11/17.   CKD: She is at stage 3a, currently not on any medication to prevent decline. She is potentially agreeable to starting [place] but is hesitant as she is not sure what her insurance will pay.   Osteoporosis: Noted on her last DEXA scan in July. Currently taking calcium  and vitamin D. She is not agreeable to initiating Fosamax at this time. Her FRAX score is 23% major osteoporotic fracture risk and 5.9% for hip fracture risk.   Atherosclerosis: controlled with fish oil and simvastatin. She is not on a daily aspirin due to previous peptic ulcer.   New  complaints: None   Social history:  Relevant past medical, surgical, family and social history reviewed and updated as indicated. Interim medical history since our last visit reviewed.  Allergies and medications reviewed and updated.  DATA REVIEWED: CHART IN EPIC  ROS: Negative unless specifically indicated above in HPI.    Current Outpatient Medications:    aspirin 81 MG EC tablet, Take 1 tablet (81 mg total) by mouth daily. Swallow whole., Disp: 30 tablet, Rfl: 12   calcium gluconate 500 MG tablet, Take 1 tablet (500 mg total) 2 (two) times daily by mouth., Disp: 60 tablet, Rfl: 11   Cholecalciferol (VITAMIN D) 2000 units tablet, Take 1 tablet (2,000 Units total) by mouth daily., Disp: 90 tablet, Rfl: 2   diazepam (VALIUM) 2 MG tablet, Take 1 tablet (2 mg total) by mouth every 6 (six) hours as needed for anxiety., Disp: 20 tablet, Rfl: 0   glucose blood test strip, Please dispense as OneTouch Ultra 2. Use as directed to monitor FSBS 1x weekly. Dx: E11.9., Disp: 100 each, Rfl: 3   Lancets (ONETOUCH ULTRASOFT) lancets, Use as directed to monitor FSBS 1x weekly. Dx: E11.9., Disp: 100 each, Rfl: 3   metFORMIN (GLUCOPHAGE) 1000 MG tablet, TAKE 1 TABLET BY MOUTH  DAILY WITH BREAKFAST, Disp: 90 tablet, Rfl: 0   metoprolol succinate (TOPROL-XL) 25 MG 24 hr tablet, TAKE 1 TABLET BY MOUTH  DAILY, Disp: 90 tablet, Rfl: 0   metroNIDAZOLE (METROGEL) 0.75 % gel, Apply 1 application topically as needed., Disp: , Rfl:    Multiple Vitamin (MULTIVITAMIN WITH MINERALS) TABS tablet, Take 1 tablet by mouth daily., Disp: , Rfl:    Omega-3 Fatty Acids (FISH OIL) 1000 MG CAPS, Take 1 capsule (1,000 mg total) by mouth 2 (two) times daily., Disp: , Rfl: 0   omeprazole (PRILOSEC) 20 MG capsule, Take 1 capsule (20 mg total) by mouth daily as needed., Disp: 90 capsule, Rfl: 1   ramipril (ALTACE) 10 MG capsule, TAKE 1 CAPSULE BY MOUTH  DAILY, Disp: 90 capsule, Rfl: 0   simvastatin (ZOCOR) 20 MG tablet, TAKE 1  TABLET BY MOUTH AT  BEDTIME, Disp: 90 tablet, Rfl: 0   vitamin B-12 (CYANOCOBALAMIN) 100 MCG tablet, Take 1 tablet (100 mcg total) by mouth daily., Disp: , Rfl:    Allergies  Allergen Reactions   Codeine Nausea And Vomiting   Penicillins Rash   Past Medical History:  Diagnosis Date   Aortic atherosclerosis ([place])    Complication of anesthesia    DDD (degenerative disc disease)    Diabetes mellitus    Diastolic dysfunction    Elevated liver function tests    eval by dr Leonides Grills to fatty liver 2008   GERD (gastroesophageal reflux disease)    Hiatal hernia    Hyperlipidemia    Hypertension    NASH (nonalcoholic steatohepatitis)    Nephrolithiasis    Osteoporosis 01/29/2021   PONV (postoperative nausea and vomiting)    heel surgery   Pulmonary nodule    no change-1995   Rosacea    Ulcer    gastric ulcer, antritis-egd 09/06/08   Vitamin D deficiency     Past Surgical History:  Procedure Laterality Date  BREAST CYST ASPIRATION Left 2005   BREAST SURGERY     benign breast nodules 2005   CHOLECYSTECTOMY     COLONOSCOPY  2008   Dr Medoff-Normal   DILATION AND CURETTAGE, DIAGNOSTIC / THERAPEUTIC     ESOPHAGOGASTRODUODENOSCOPY     FRACTURE SURGERY     MALONEY DILATION  02/08/2012   Procedure: MALONEY DILATION;  Surgeon: Danie Binder, MD;  Location: [facility];  Service: Endoscopy;  Laterality: N/A;   SAVORY DILATION  02/08/2012   Procedure: SAVORY DILATION;  Surgeon: Danie Binder, MD;  Location: [facility];  Service: Endoscopy;  Laterality: N/A;   SPINE SURGERY      Social History   Socioeconomic History   Marital status: Married    Spouse name: Not on file   Number of children: 2   Years of education: Not on file   Highest education level: Not on file  Occupational History   Occupation: homemaker  Tobacco Use   Smoking status: Former    Types: Cigarettes    Quit date: 07/06/1988    Years since quitting: 32.8   Smokeless tobacco: Never   Tobacco  comments:    intermittent for a few months  Vaping Use   Vaping Use: Never used  Substance and Sexual Activity   Alcohol use: Not Currently    Comment: occasional mixed drink couple per mo   Drug use: No   Sexual activity: Not on file  Other Topics Concern   Not on file  Social History Narrative   Lives w/ husband   Social Determinants of Health   Financial Resource Strain: Not on file  Food Insecurity: Not on file  Transportation Needs: Not on file  Physical Activity: Not on file  Stress: Not on file  Social Connections: Not on file  Intimate Partner Violence: Not on file        Objective:    BP 135/80   Pulse 66   Temp (!) 97.2 F (36.2 C) (Temporal)   Ht '5\' 1"'  (1.549 m)   Wt 79.6 kg   SpO2 97%   BMI 33.14 kg/m   Wt Readings from Last 3 Encounters:  05/14/21 175 lb 6.4 oz (79.6 kg)  04/03/21 177 lb 2 oz (80.3 kg)  01/23/21 180 lb 9.6 oz (81.9 kg)    Physical Exam Vitals reviewed.  Constitutional:      General: She is not in acute distress.    Appearance: Normal appearance. She is obese. She is not ill-appearing, toxic-appearing or diaphoretic.  HENT:     Head: Normocephalic and atraumatic.  Eyes:     General: No scleral icterus.       Right eye: No discharge.        Left eye: No discharge.     Conjunctiva/sclera: Conjunctivae normal.  Cardiovascular:     Rate and Rhythm: Normal rate and regular rhythm.     Heart sounds: Normal heart sounds. No murmur heard.   No friction rub. No gallop.  Pulmonary:     Effort: Pulmonary effort is normal. No respiratory distress.     Breath sounds: Normal breath sounds. No stridor. No wheezing, rhonchi or rales.  Musculoskeletal:        General: Normal range of motion.     Cervical back: Normal range of motion.  Skin:    General: Skin is warm and dry.     Capillary Refill: Capillary refill takes less than 2 seconds.  Neurological:  General: No focal deficit present.     Mental Status: She is alert and  oriented to person, place, and time. Mental status is at baseline.  Psychiatric:        Mood and Affect: Mood normal.        Behavior: Behavior normal.        Thought Content: Thought content normal.        Judgment: Judgment normal.    Lab Results  Component Value Date   TSH 2.69 07/20/2018   Lab Results  Component Value Date   WBC 4.9 01/23/2021   HGB 12.7 01/23/2021   HCT 38.4 01/23/2021   MCV 94 01/23/2021   PLT 260 01/23/2021   Lab Results  Component Value Date   NA 143 01/23/2021   K 4.5 01/23/2021   CO2 24 01/23/2021   GLUCOSE 105 (H) 01/23/2021   BUN 24 01/23/2021   CREATININE 1.13 (H) 01/23/2021   BILITOT 0.3 01/23/2021   ALKPHOS 83 01/23/2021   AST 21 01/23/2021   ALT 15 01/23/2021   PROT 7.0 01/23/2021   ALBUMIN 4.5 01/23/2021   CALCIUM 9.9 01/23/2021   EGFR 53 (L) 01/23/2021   Lab Results  Component Value Date   CHOL 151 01/23/2021   Lab Results  Component Value Date   HDL 52 01/23/2021   Lab Results  Component Value Date   LDLCALC 75 01/23/2021   Lab Results  Component Value Date   TRIG 138 01/23/2021   Lab Results  Component Value Date   CHOLHDL 2.9 01/23/2021   Lab Results  Component Value Date   HGBA1C 6.1 01/23/2021

## 2021-05-15 ENCOUNTER — Telehealth: Payer: Self-pay | Admitting: Family Medicine

## 2021-05-15 DIAGNOSIS — N1831 Chronic kidney disease, stage 3a: Secondary | ICD-10-CM

## 2021-05-15 LAB — MICROALBUMIN / CREATININE URINE RATIO
Creatinine, Urine: 168.9 mg/dL
Microalb/Creat Ratio: 7 mg/g creat (ref 0–29)
Microalbumin, Urine: 12.2 ug/mL

## 2021-05-16 ENCOUNTER — Telehealth: Payer: Self-pay

## 2021-05-16 NOTE — Telephone Encounter (Signed)
Referral placed to our clinical pharmacist. Someone from her team will contact her to get her scheduled to see [name].

## 2021-05-16 NOTE — Telephone Encounter (Signed)
Lmtcb.

## 2021-05-16 NOTE — Chronic Care Management (AMB) (Signed)
  Chronic Care Management   Note  05/16/2021 Name: Denise Meadows MRN: [id number] DOB: 1950-10-15  Denise Meadows is a 70 y.o. year old female who is a primary care patient of Loman Brooklyn, FNP. I reached out to [employer] by phone today in response to a referral sent by [name]'s PCP.  [name] was given information about Chronic Care Management services today including:  CCM service includes personalized support from designated clinical staff supervised by her physician, including individualized plan of care and coordination with other care providers 24/7 contact phone numbers for assistance for urgent and routine care needs. Service will only be billed when office clinical staff spend 20 minutes or more in a month to coordinate care. Only one practitioner may furnish and bill the service in a calendar month. The patient may stop CCM services at any time (effective at the end of the month) by phone call to the office staff. The patient is responsible for co-pay (up to 20% after annual deductible is met) if co-pay is required by the individual health plan.   Patient agreed to services and verbal consent obtained.   Follow up plan: Telephone appointment with care management team member scheduled for:05/26/2021  Noreene Larsson, [place], [place], [place] [address] Direct Dial: [phone] [email or web address]: [email or web address]

## 2021-05-23 ENCOUNTER — Encounter (HOSPITAL_BASED_OUTPATIENT_CLINIC_OR_DEPARTMENT_OTHER): Payer: Self-pay

## 2021-05-23 ENCOUNTER — Ambulatory Visit (HOSPITAL_BASED_OUTPATIENT_CLINIC_OR_DEPARTMENT_OTHER): Payer: Medicare Other | Admitting: Family

## 2021-05-26 ENCOUNTER — Encounter: Payer: Medicare Other | Admitting: *Deleted

## 2021-05-28 NOTE — Telephone Encounter (Signed)
No return call made

## 2021-07-03 ENCOUNTER — Telehealth: Payer: Self-pay | Admitting: Family Medicine

## 2021-07-03 NOTE — Telephone Encounter (Signed)
Left message to call back and schedule AWV

## 2021-07-04 ENCOUNTER — Telehealth: Payer: Self-pay | Admitting: Family Medicine

## 2021-07-07 ENCOUNTER — Other Ambulatory Visit: Payer: Self-pay | Admitting: Family Medicine

## 2021-07-07 DIAGNOSIS — E782 Mixed hyperlipidemia: Secondary | ICD-10-CM

## 2021-07-07 DIAGNOSIS — E1165 Type 2 diabetes mellitus with hyperglycemia: Secondary | ICD-10-CM

## 2021-07-07 DIAGNOSIS — I10 Essential (primary) hypertension: Secondary | ICD-10-CM

## 2021-07-24 ENCOUNTER — Telehealth: Payer: Medicare Other

## 2021-09-11 ENCOUNTER — Telehealth: Payer: Self-pay | Admitting: Pharmacist

## 2021-09-11 ENCOUNTER — Ambulatory Visit (INDEPENDENT_AMBULATORY_CARE_PROVIDER_SITE_OTHER): Payer: Medicare Other

## 2021-09-11 ENCOUNTER — Encounter: Payer: Self-pay | Admitting: Family Medicine

## 2021-09-11 ENCOUNTER — Ambulatory Visit (INDEPENDENT_AMBULATORY_CARE_PROVIDER_SITE_OTHER): Payer: Medicare Other | Admitting: Family Medicine

## 2021-09-11 VITALS — BP 122/60 | HR 66 | Temp 97.3°F | Ht 61.0 in | Wt 173.2 lb

## 2021-09-11 DIAGNOSIS — M25361 Other instability, right knee: Secondary | ICD-10-CM

## 2021-09-11 DIAGNOSIS — M1711 Unilateral primary osteoarthritis, right knee: Secondary | ICD-10-CM | POA: Diagnosis not present

## 2021-09-11 DIAGNOSIS — M810 Age-related osteoporosis without current pathological fracture: Secondary | ICD-10-CM | POA: Diagnosis not present

## 2021-09-11 DIAGNOSIS — E1165 Type 2 diabetes mellitus with hyperglycemia: Secondary | ICD-10-CM

## 2021-09-11 DIAGNOSIS — Z79899 Other long term (current) drug therapy: Secondary | ICD-10-CM | POA: Diagnosis not present

## 2021-09-11 DIAGNOSIS — E782 Mixed hyperlipidemia: Secondary | ICD-10-CM | POA: Diagnosis not present

## 2021-09-11 DIAGNOSIS — N1831 Chronic kidney disease, stage 3a: Secondary | ICD-10-CM | POA: Diagnosis not present

## 2021-09-11 DIAGNOSIS — E559 Vitamin D deficiency, unspecified: Secondary | ICD-10-CM | POA: Diagnosis not present

## 2021-09-11 DIAGNOSIS — M11261 Other chondrocalcinosis, right knee: Secondary | ICD-10-CM

## 2021-09-11 DIAGNOSIS — I10 Essential (primary) hypertension: Secondary | ICD-10-CM | POA: Diagnosis not present

## 2021-09-11 DIAGNOSIS — M5431 Sciatica, right side: Secondary | ICD-10-CM | POA: Diagnosis not present

## 2021-09-11 DIAGNOSIS — F410 Panic disorder [episodic paroxysmal anxiety] without agoraphobia: Secondary | ICD-10-CM

## 2021-09-11 DIAGNOSIS — E669 Obesity, unspecified: Secondary | ICD-10-CM

## 2021-09-11 DIAGNOSIS — I700 Atherosclerosis of aorta: Secondary | ICD-10-CM

## 2021-09-11 DIAGNOSIS — K219 Gastro-esophageal reflux disease without esophagitis: Secondary | ICD-10-CM | POA: Diagnosis not present

## 2021-09-11 LAB — BAYER DCA HB A1C WAIVED: HB A1C (BAYER DCA - WAIVED): 6.3 % — ABNORMAL HIGH (ref 4.8–5.6)

## 2021-09-11 LAB — HM DIABETES EYE EXAM

## 2021-09-11 LAB — LIPID PANEL

## 2021-09-11 MED ORDER — DAPAGLIFLOZIN PROPANEDIOL 10 MG PO TABS: 10.0000 mg | ORAL_TABLET | Freq: Every day | ORAL | 6 refills | Status: DC

## 2021-09-11 MED ORDER — PREDNISONE 10 MG (21) PO TBPK: ORAL_TABLET | ORAL | 0 refills | Status: DC

## 2021-09-11 NOTE — Telephone Encounter (Signed)
Application filled out for farxiga/AZ&me patient assistance program ?Escribed to [facility] (az&me patient assistance pharmacy) ?

## 2021-09-11 NOTE — Progress Notes (Signed)
Assessment & Plan:  1. Type 2 diabetes mellitus with hyperglycemia, without long-term current use of insulin (HCC) Lab Results  Component Value Date   HGBA1C 6.3 (H) 09/11/2021   HGBA1C 6.3 (H) 05/14/2021   HGBA1C 6.1 01/23/2021    - Diabetes is at goal of A1c < 7. - Medications: continue current medications - Home glucose monitoring: continue monitoring. - Patient is currently taking a statin. Patient is taking an ACE-inhibitor/ARB.  - Diabetic retinal images obtained today in office with [facility].  Diabetes Health Maintenance Due  Topic Date Due   OPHTHALMOLOGY EXAM  03/23/2020   HEMOGLOBIN A1C  11/11/2021   FOOT EXAM  01/23/2022    Lab Results  Component Value Date   LABMICR 12.2 05/14/2021   MICROALBUR 4.4 07/20/2018   MICROALBUR 1.2 04/08/2017   - Lipid panel - CBC with Differential/Platelet - CMP14+EGFR - Bayer DCA Hb A1c Waived - Vitamin B12  2. Primary hypertension Well controlled on current regimen.  - Lipid panel - CBC with Differential/Platelet - CMP14+EGFR  3. Mixed hyperlipidemia Well controlled on current regimen.  - Lipid panel - CMP14+EGFR  4. Aortic atherosclerosis (HCC) Continue statin. Unable to take aspirin due to personal history of peptic ulcers. - Lipid panel - CMP14+EGFR  5. Gastroesophageal reflux disease, unspecified whether esophagitis present Well controlled on current regimen.  - CMP14+EGFR  6. Age-related osteoporosis without current pathological fracture Continue calcium and vitamin D supplements. Dietary information provided on food sources high in calcium and vitamin D. Patient is not agreeable in starting Fosamax.  7. Stage 3a chronic kidney disease ([place]) Patient assistance form for Farxiga completed by our clinical pharmacist again today since they seem to have been misplaced previously. - CMP14+EGFR  8. Obesity (BMI 30.0-34.9) Encouraged healthy eating and exercise. - Lipid panel - CBC with Differential/Platelet -  CMP14+EGFR  9. Vitamin D deficiency Labs to assess. - VITAMIN D 25 Hydroxy (Vit-D Deficiency, Fractures)  10-11. Panic attacks/Controlled substance agreement signed Patient has not had any recent panic attacks. Controlled substance agreement in place for PRN Valium. Urine drug screen as expected. PDMP reviewed with no concerning findings.   12. Right sided sciatica - predniSONE (STERAPRED UNI-PAK 21 TAB) 10 MG (21) TBPK tablet; As directed x 6 days  Dispense: 21 tablet; Refill: 0  13. Right knee gives way We did discuss possible referral to PT depending on her x-ray results. - DG Knee 1-2 Views Right   Return in about 6 months (around 03/14/2022) for annual physical.  Hendricks Limes, MSN, APRN, FNP-C [facility]  Subjective:    Patient ID: Denise Meadows, female    DOB: 03/11/1951, 71 y.o.   MRN: [id number]  Patient Care Team: Loman Brooklyn, FNP as PCP - General (Family Medicine) Danie Binder, MD (Inactive) (Gastroenterology) Edythe Clarity, [facility] as Pharmacist (Pharmacist) Harlen Labs, MD as Referring Physician (Optometry) Blanca Friend Royce Macadamia, [facility] as [place] Management (Pharmacist) Ilean China, RN as [place] Management Hudy, Delice Bison, RN as Case Scientist, physiological Complaint:  Chief Complaint  Patient presents with   Medical Management of Chronic Issues   Leg Pain    Patient states that she has been having right leg pain that runs down leg and has been on and off. Also, states that right knee feels like it is going to give out.     HPI: Denise Meadows is a 71 y.o. female presenting on 09/11/2021  for Medical Management of Chronic Issues and Leg Pain (Patient states that she has been having right leg pain that runs down leg and has been on and off. Also, states that right knee feels like it is going to give out. )  Diabetes: Current symptoms include: none. Known diabetic complications: none. Medication  compliance: Yes. Actos was previously discontinued; she continues to take metformin 1,000 mg daily. Current diet:  some healthy, some cheating . Current exercise: walking. Home blood sugar records: BGs range between 90 and 120 . Is she  on ACE inhibitor or angiotensin II receptor blocker? Yes (Ramipril). Is she on a statin? Yes (Simvastatin).   Hypertension: She does not take her BP often at home, but when she does, she usually gets readings around 120/60. She is taking ramipril and metoprolol. She had an appointment with her cardiologist in November which was canceled; she is now rescheduled for 10/23/2021.  CKD: She is at stage 3a, currently not on any medication to prevent decline. She has completed prescription assistance forms for [facility], but has never heard back from them, this was in November.  Osteoporosis: Noted on her last DEXA scan in July. Currently taking calcium and vitamin D. She is not agreeable to initiating Fosamax at this time.   Atherosclerosis: taking fish oil and simvastatin. She is not on a daily aspirin due to previous peptic ulcer.   GERD: taking omeprazole daily as needed which is helpful.  Vitamin D Insufficiency: taking a vitamin D supplement. Her last vitamin D level was normal in April 2022.  Panic attacks: Patient has a history of having panic attacks.  This is the only time she takes a Valium.  She has not had to take one in the past two and a half years. She believes her prescription may now be expired.  New complaints: Patient reports right leg pain that starts around her hip and runs down her leg to her ankle. The has been intermittent, but has been worsening over the past month and a half. She had back surgery years ago. She is unable to stand for long periods of time due to the pain. Also, her right knee feels like it is going to give away. It has been giving her a problem since she fell on it two years ago. She did have a negative knee x-ray in August 2020. She  has never completed any physical therapy.    Social history:  Relevant past medical, surgical, family and social history reviewed and updated as indicated. Interim medical history since our last visit reviewed.  Allergies and medications reviewed and updated.  DATA REVIEWED: CHART IN EPIC  ROS: Negative unless specifically indicated above in HPI.    Current Outpatient Medications:    calcium gluconate 500 MG tablet, Take 1 tablet (500 mg total) 2 (two) times daily by mouth., Disp: 60 tablet, Rfl: 11   Cholecalciferol (VITAMIN D) 2000 units tablet, Take 1 tablet (2,000 Units total) by mouth daily., Disp: 90 tablet, Rfl: 2   diazepam (VALIUM) 2 MG tablet, Take 1 tablet (2 mg total) by mouth every 6 (six) hours as needed for anxiety., Disp: 20 tablet, Rfl: 0   glucose blood test strip, Please dispense as OneTouch Ultra 2. Use as directed to monitor FSBS 1x weekly. Dx: E11.9., Disp: 100 each, Rfl: 3   Lancets (ONETOUCH ULTRASOFT) lancets, Use as directed to monitor FSBS 1x weekly. Dx: E11.9., Disp: 100 each, Rfl: 3   metFORMIN (GLUCOPHAGE) 1000 MG tablet, TAKE 1  TABLET BY MOUTH  DAILY WITH BREAKFAST, Disp: 90 tablet, Rfl: 3   metoprolol succinate (TOPROL-XL) 25 MG 24 hr tablet, TAKE 1 TABLET BY MOUTH ONCE DAILY, Disp: 90 tablet, Rfl: 3   metroNIDAZOLE (METROGEL) 0.75 % gel, Apply 1 application topically as needed., Disp: , Rfl:    Multiple Vitamin (MULTIVITAMIN WITH MINERALS) TABS tablet, Take 1 tablet by mouth daily., Disp: , Rfl:    Omega-3 Fatty Acids (FISH OIL) 1000 MG CAPS, Take 1 capsule (1,000 mg total) by mouth 2 (two) times daily., Disp: , Rfl: 0   omeprazole (PRILOSEC) 20 MG capsule, Take 1 capsule (20 mg total) by mouth daily as needed., Disp: 90 capsule, Rfl: 1   ramipril (ALTACE) 10 MG capsule, TAKE 1 CAPSULE BY MOUTH  DAILY, Disp: 90 capsule, Rfl: 3   simvastatin (ZOCOR) 20 MG tablet, TAKE 1 TABLET BY MOUTH AT  BEDTIME, Disp: 90 tablet, Rfl: 3   vitamin B-12 (CYANOCOBALAMIN) 100  MCG tablet, Take 1 tablet (100 mcg total) by mouth daily., Disp: , Rfl:    Allergies  Allergen Reactions   Codeine Nausea And Vomiting   Penicillins Rash   Past Medical History:  Diagnosis Date   Aortic atherosclerosis ([place])    Complication of anesthesia    DDD (degenerative disc disease)    Diabetes mellitus    Diastolic dysfunction    Elevated liver function tests    eval by dr Leonides Grills to fatty liver 2008   GERD (gastroesophageal reflux disease)    Hiatal hernia    Hyperlipidemia    Hypertension    NASH (nonalcoholic steatohepatitis)    Nephrolithiasis    Osteoporosis 01/29/2021   PONV (postoperative nausea and vomiting)    heel surgery   Pulmonary nodule    no change-1995   Rosacea    Ulcer    gastric ulcer, antritis-egd 09/06/08   Vitamin D deficiency     Past Surgical History:  Procedure Laterality Date   BREAST CYST ASPIRATION Left 2005   BREAST SURGERY     benign breast nodules 2005   CHOLECYSTECTOMY     COLONOSCOPY  2008   Dr Medoff-Normal   DILATION AND CURETTAGE, DIAGNOSTIC / THERAPEUTIC     ESOPHAGOGASTRODUODENOSCOPY     FRACTURE SURGERY     MALONEY DILATION  02/08/2012   Procedure: MALONEY DILATION;  Surgeon: Danie Binder, MD;  [facility];  Service: Endoscopy;  Laterality: N/A;   SAVORY DILATION  02/08/2012   Procedure: SAVORY DILATION;  Surgeon: Danie Binder, MD;  Location: [facility];  Service: Endoscopy;  Laterality: N/A;   SPINE SURGERY      Social History   Socioeconomic History   Marital status: Married    Spouse name: Not on file   Number of children: 2   Years of education: Not on file   Highest education level: Not on file  Occupational History   Occupation: homemaker  Tobacco Use   Smoking status: Former    Types: Cigarettes    Quit date: 07/06/1988    Years since quitting: 33.2   Smokeless tobacco: Never   Tobacco comments:    intermittent for a few months  Vaping Use   Vaping Use: Never used   Substance and Sexual Activity   Alcohol use: Not Currently    Comment: occasional mixed drink couple per mo   Drug use: No   Sexual activity: Not on file  Other Topics Concern   Not on file  Social History  Narrative   Lives w/ husband   Social Determinants of Health   Financial Resource Strain: Medium Risk   Difficulty of Paying Living Expenses: Somewhat hard  Food Insecurity: Not on file  Transportation Needs: No Transportation Needs   Lack of Transportation (Medical): No   Lack of Transportation (Non-Medical): No  Physical Activity: Not on file  Stress: Not on file  Social Connections: Not on file  Intimate Partner Violence: Not on file        Objective:    Pulse 66    Temp (!) 97.3 F (36.3 C) (Temporal)    Ht '5\' 1"'  (1.549 m)    Wt 173 lb 3.2 oz (78.6 kg)    SpO2 97%    BMI 32.73 kg/m   Wt Readings from Last 3 Encounters:  09/11/21 173 lb 3.2 oz (78.6 kg)  05/14/21 175 lb 6.4 oz (79.6 kg)  04/03/21 177 lb 2 oz (80.3 kg)    Physical Exam Vitals reviewed.  Constitutional:      General: She is not in acute distress.    Appearance: Normal appearance. She is obese. She is not ill-appearing, toxic-appearing or diaphoretic.  HENT:     Head: Normocephalic and atraumatic.  Eyes:     General: No scleral icterus.       Right eye: No discharge.        Left eye: No discharge.     Conjunctiva/sclera: Conjunctivae normal.  Cardiovascular:     Rate and Rhythm: Normal rate and regular rhythm.     Heart sounds: Normal heart sounds. No murmur heard.   No friction rub. No gallop.  Pulmonary:     Effort: Pulmonary effort is normal. No respiratory distress.     Breath sounds: Normal breath sounds. No stridor. No wheezing, rhonchi or rales.  Musculoskeletal:        General: Normal range of motion.     Cervical back: Normal range of motion.     Right knee: Normal.     Instability Tests: Anterior drawer test negative. Posterior drawer test negative. Anterior Lachman test  negative. Medial McMurray test negative and lateral McMurray test negative.  Skin:    General: Skin is warm and dry.     Capillary Refill: Capillary refill takes less than 2 seconds.  Neurological:     General: No focal deficit present.     Mental Status: She is alert and oriented to person, place, and time. Mental status is at baseline.  Psychiatric:        Mood and Affect: Mood normal.        Behavior: Behavior normal.        Thought Content: Thought content normal.        Judgment: Judgment normal.    Lab Results  Component Value Date   TSH 2.69 07/20/2018   Lab Results  Component Value Date   WBC 6.1 05/14/2021   HGB 13.2 05/14/2021   HCT 40.2 05/14/2021   MCV 95 05/14/2021   PLT 261 05/14/2021   Lab Results  Component Value Date   NA 139 05/14/2021   K 4.7 05/14/2021   CO2 22 05/14/2021   GLUCOSE 115 (H) 05/14/2021   BUN 18 05/14/2021   CREATININE 1.12 (H) 05/14/2021   BILITOT 0.4 05/14/2021   ALKPHOS 95 05/14/2021   AST 17 05/14/2021   ALT 14 05/14/2021   PROT 6.7 05/14/2021   ALBUMIN 4.7 05/14/2021   CALCIUM 9.8 05/14/2021   EGFR 53 (L) 05/14/2021  Lab Results  Component Value Date   CHOL 152 05/14/2021   Lab Results  Component Value Date   HDL 48 05/14/2021   Lab Results  Component Value Date   LDLCALC 75 05/14/2021   Lab Results  Component Value Date   TRIG 171 (H) 05/14/2021   Lab Results  Component Value Date   CHOLHDL 3.2 05/14/2021   Lab Results  Component Value Date   HGBA1C 6.3 (H) 05/14/2021

## 2021-09-12 LAB — CBC WITH DIFFERENTIAL/PLATELET
Basophils Absolute: 0.1 10*3/uL (ref 0.0–0.2)
Basos: 1 %
EOS (ABSOLUTE): 0.3 10*3/uL (ref 0.0–0.4)
Eos: 4 %
Hematocrit: 39.6 % (ref 34.0–46.6)
Hemoglobin: 13.1 g/dL (ref 11.1–15.9)
Immature Grans (Abs): 0 10*3/uL (ref 0.0–0.1)
Immature Granulocytes: 0 %
Lymphocytes Absolute: 2 10*3/uL (ref 0.7–3.1)
Lymphs: 30 %
MCH: 30.5 pg (ref 26.6–33.0)
MCHC: 33.1 g/dL (ref 31.5–35.7)
MCV: 92 fL (ref 79–97)
Monocytes Absolute: 0.7 10*3/uL (ref 0.1–0.9)
Monocytes: 11 %
Neutrophils Absolute: 3.4 10*3/uL (ref 1.4–7.0)
Neutrophils: 54 %
Platelets: 284 10*3/uL (ref 150–450)
RBC: 4.3 x10E6/uL (ref 3.77–5.28)
RDW: 12.4 % (ref 11.7–15.4)
WBC: 6.4 10*3/uL (ref 3.4–10.8)

## 2021-09-12 LAB — CMP14+EGFR
ALT: 13 IU/L (ref 0–32)
AST: 15 IU/L (ref 0–40)
Albumin/Globulin Ratio: 2 (ref 1.2–2.2)
Albumin: 4.3 g/dL (ref 3.8–4.8)
Alkaline Phosphatase: 100 IU/L (ref 44–121)
BUN/Creatinine Ratio: 14 (ref 12–28)
BUN: 17 mg/dL (ref 8–27)
Bilirubin Total: 0.5 mg/dL (ref 0.0–1.2)
CO2: 22 mmol/L (ref 20–29)
Calcium: 9.9 mg/dL (ref 8.7–10.3)
Chloride: 100 mmol/L (ref 96–106)
Creatinine, Ser: 1.19 mg/dL — ABNORMAL HIGH (ref 0.57–1.00)
Globulin, Total: 2.2 g/dL (ref 1.5–4.5)
Glucose: 141 mg/dL — ABNORMAL HIGH (ref 70–99)
Potassium: 5 mmol/L (ref 3.5–5.2)
Sodium: 137 mmol/L (ref 134–144)
Total Protein: 6.5 g/dL (ref 6.0–8.5)
eGFR: 49 mL/min/{1.73_m2} — ABNORMAL LOW (ref >59)

## 2021-09-12 LAB — LIPID PANEL
Chol/HDL Ratio: 3 ratio (ref 0.0–4.4)
Cholesterol, Total: 155 mg/dL (ref 100–199)
HDL: 51 mg/dL (ref >39)
LDL Chol Calc (NIH): 74 mg/dL (ref 0–99)
Triglycerides: 176 mg/dL — ABNORMAL HIGH (ref 0–149)
VLDL Cholesterol Cal: 30 mg/dL (ref 5–40)

## 2021-09-13 LAB — B12 AND FOLATE PANEL
Folate: 11 ng/mL (ref >3.0)
Vitamin B-12: 1557 pg/mL — ABNORMAL HIGH (ref 232–1245)

## 2021-09-13 LAB — SPECIMEN STATUS REPORT

## 2021-09-13 LAB — VITAMIN D 25 HYDROXY (VIT D DEFICIENCY, FRACTURES): Vit D, 25-Hydroxy: 48.9 ng/mL (ref 30.0–100.0)

## 2021-09-17 ENCOUNTER — Encounter: Payer: Self-pay | Admitting: Orthopedic Surgery

## 2021-09-29 ENCOUNTER — Encounter: Payer: Self-pay | Admitting: Orthopedic Surgery

## 2021-09-29 ENCOUNTER — Ambulatory Visit: Payer: Medicare Other | Admitting: Orthopedic Surgery

## 2021-09-29 ENCOUNTER — Other Ambulatory Visit: Payer: Self-pay

## 2021-09-29 VITALS — BP 156/108 | HR 102 | Ht 60.0 in | Wt 174.6 lb

## 2021-09-29 DIAGNOSIS — M25361 Other instability, right knee: Secondary | ICD-10-CM | POA: Diagnosis not present

## 2021-09-29 NOTE — Progress Notes (Signed)
Chief Complaint  ?Patient presents with  ? Knee Pain  ?  New problem RT knee ?Pt states there isn't a lot of pain however she feels like her knee is going to buck and "go back the wrong way".  ?Hx of fall in 2020 and she landed on her RT knee  ? ? ?HPI: This is a 71 year old female whose had a lumbar disc procedure about 20 years ago presents with giving way symptoms of the right knee with no history of trauma.  She had an x-ray that showed she had some chondrocalcinosis and arthritis in her right knee and she presents for evaluation and management of a giving way symptoms ? ?The giving way seems to come when she ambulating.  There is no twisting or turning involved just walking forward and the knee will buckle ? ?She says she has no knee pain ? ? ? ?Past Medical History:  ?Diagnosis Date  ? Aortic atherosclerosis ([place])   ? Complication of anesthesia   ? DDD (degenerative disc disease)   ? Diabetes mellitus   ? Diastolic dysfunction   ? Elevated liver function tests   ? eval by dr Leonides Grills to fatty liver 2008  ? GERD (gastroesophageal reflux disease)   ? Hiatal hernia   ? Hyperlipidemia   ? Hypertension   ? NASH (nonalcoholic steatohepatitis)   ? Nephrolithiasis   ? Osteoporosis 01/29/2021  ? PONV (postoperative nausea and vomiting)   ? heel surgery  ? Pulmonary nodule   ? no change-1995  ? Rosacea   ? Ulcer   ? gastric ulcer, antritis-egd 09/06/08  ? Vitamin D deficiency   ? ? ?BP (!) 156/108   Pulse (!) 102   Ht 5' (1.524 m)   Wt 174 lb 9.6 oz (79.2 kg)   BMI 34.10 kg/m?  ? ? ?General appearance: Well-developed well-nourished no gross deformities ? ?Cardiovascular normal pulse and perfusion normal color without edema ? ?Neurologically no sensation loss or deficits or pathologic reflexes ? ?Psychological: Awake alert and oriented x3 mood and affect normal ? ?Skin no lacerations or ulcerations no nodularity no palpable masses, no erythema or nodularity ? ?Musculoskeletal: Evaluation of the right knee shows  a comes to full extension she has good flexion in the knee and there is no pain.  She has no palpable tenderness around the patella or lateral joint line with mild pain on the medial joint line.  The knee feels rocksolid in terms of collateral ligament function and cruciate ligament function ? ?Imaging outside images are reviewed ? ?My interpretation of the x-rays of the right knee are that she has grade 1/2 arthritis with chondrocalcinosis ? ? ? ?A/P ? ?71 year old female with arthritis mild chondrocalcinosis seen on right knee film and giving way right knee with no history of trauma no ligamentous instability most likely has quadriceps weakness which may be induced by the back condition ? ?At this point with asymptomatic knee there is no surgical intervention or orthopedic intervention that needs to occur at this time ? ?I encouraged her to use her walking stick and be careful when she is walking because the knee may give at any time but it does not appear to be related to any knee pathology ?

## 2021-10-13 ENCOUNTER — Telehealth: Payer: Self-pay | Admitting: Family Medicine

## 2021-10-15 ENCOUNTER — Telehealth: Payer: Self-pay

## 2021-10-15 NOTE — Chronic Care Management (AMB) (Signed)
?  Chronic Care Management  ? ?Note ? ?10/15/2021 ?Name: Denise Meadows MRN: [id number] DOB: [birth year] ? ?Denise Meadows is a 71 y.o. year old female who is a primary care patient of Loman Brooklyn, FNP. Denise Meadows is currently enrolled in care management services. An additional referral for pharm d  was placed.  ? ?Follow up plan: ?Face to Face appointment with care management team member scheduled for: 10/16/2021 ? ?Noreene Larsson, RMA ?Care Guide, Embedded Care Coordination ?[facility]  [facility]  ?[place], [place] [address] ?Direct Dial: [phone] ?Museum/gallery conservator.[email or web address] ?Website: [email or web address]  ? ?

## 2021-10-15 NOTE — Chronic Care Management (AMB) (Signed)
?  Chronic Care Management  ? ?Note ? ?10/15/2021 ?Name: Denise Meadows MRN: [id number] DOB: 12/07/1950 ? ?Denise Meadows is a 71 y.o. year old female who is a primary care patient of Loman Brooklyn, FNP. Denise Meadows is currently enrolled in care management services. An additional referral for pharm D  was placed.  ? ?Follow up plan: ?Unsuccessful telephone outreach attempt made. A HIPAA compliant phone message was left for the patient providing contact information and requesting a return call.  ?The care management team will reach out to the patient again over the next 5 days.  ?If patient returns call to provider office, please advise to call [place]  at [phone] ? ?Noreene Larsson, RMA ?Care Guide, Embedded Care Coordination ?[facility]  [facility]  ?[place], [place] [address] ?Direct Dial: [phone] ?Museum/gallery conservator.[email or web address] ?Website: [email or web address]  ? ?

## 2021-10-16 ENCOUNTER — Ambulatory Visit (INDEPENDENT_AMBULATORY_CARE_PROVIDER_SITE_OTHER): Payer: Medicare Other | Admitting: Pharmacist

## 2021-10-16 DIAGNOSIS — E1165 Type 2 diabetes mellitus with hyperglycemia: Secondary | ICD-10-CM

## 2021-10-16 DIAGNOSIS — N1831 Chronic kidney disease, stage 3a: Secondary | ICD-10-CM

## 2021-10-16 NOTE — Progress Notes (Signed)
? ? ?Chronic Care Management ?Pharmacy Note ? ?10/16/2021 ?Name:  Denise Meadows MRN:  [id number] DOB:  02-03-1951 ? ?Summary: ?Diabetes: New goal. ?Controlled; current treatment: METFORMIN;  ?Given GFR 49 (CKD3a), would like to start [place] (patient unable to afford) ?Will apply for patient assistance via AZ&me ?We can look at decreasing/discontinuing metformin in the future ?Discussed meal planning options and Plate method for healthy eating ?LIMIT/avoid sugary drinks and desserts ?Incorporate balanced protein, non starchy veggies, 1 serving of carbohydrate with each meal ?Increase water intake ?Increase physical activity as able ?Current exercise: encouraged ?Assessed patient finances. Application sent to az&me patient assistance  ? ?Subjective: ?Denise Meadows is an 71 y.o. year old female who is a primary patient of Loman Brooklyn, FNP.  The CCM team was consulted for assistance with disease management and care coordination needs.   ? ?Engaged with patient face to face for follow up visit in response to provider referral for pharmacy case management and/or care coordination services.  ? ?Consent to Services:  ?The patient was given information about Chronic Care Management services, agreed to services, and gave verbal consent prior to initiation of services.  Please see initial visit note for detailed documentation.  ? ?Patient Care Team: ?Loman Brooklyn, FNP as PCP - General (Family Medicine) ?Fields, Marga Melnick, MD (Inactive) (Gastroenterology) ?Edythe Clarity, [facility] as Pharmacist (Pharmacist) ?Harlen Labs, MD as Referring Physician (Optometry) ?Lavera Guise, [facility] as Pharmacist (Family Medicine) ?Ilean China, RN as [place] Management ?Ilean China, RN as Case Manager ? ?Objective: ? ?Lab Results  ?Component Value Date  ? CREATININE 1.19 (H) 09/11/2021  ? CREATININE 1.12 (H) 05/14/2021  ? CREATININE 1.13 (H) 01/23/2021  ? ? ?Lab Results  ?Component Value Date  ? HGBA1C 6.3  (H) 09/11/2021  ? ?Last diabetic Eye exam:  ?Lab Results  ?Component Value Date/Time  ? HMDIABEYEEXA No Retinopathy 09/11/2021 12:00 AM  ?  ?Last diabetic Foot exam: No results found for: HMDIABFOOTEX  ? ?   ?Component Value Date/Time  ? CHOL 155 09/11/2021 0810  ? TRIG 176 (H) 09/11/2021 0810  ? HDL 51 09/11/2021 0810  ? CHOLHDL 3.0 09/11/2021 0810  ? CHOLHDL 3.6 04/17/2020 0834  ? VLDL 35 (H) 10/07/2016 0846  ? [place] 74 09/11/2021 0810  ? [place] 91 04/17/2020 0834  ? ? ? ?  Latest Ref Rng & Units 09/11/2021  ?  8:10 AM 05/14/2021  ?  8:02 AM 01/23/2021  ?  9:39 AM  ?Hepatic Function  ?Total Protein 6.0 - 8.5 g/dL 6.5   6.7   7.0    ?Albumin 3.8 - 4.8 g/dL 4.3   4.7   4.5    ?AST 0 - 40 IU/L _0 ?ALT 0 - 32 IU/L _1 ?Alk Phosphatase 44 - 121 IU/L 100   95   83    ?Total Bilirubin 0.0 - 1.2 mg/dL 0.5   0.4   0.3    ? ? ?Lab Results  ?Component Value Date/Time  ? TSH 2.69 07/20/2018 08:51 AM  ? TSH 1.45 12/26/2015 08:33 AM  ? ? ? ?  Latest Ref Rng & Units 09/11/2021  ?  8:10 AM 05/14/2021  ?  8:02 AM 01/23/2021  ?  9:39 AM  ?CBC  ?WBC 3.4 - 10.8 x10E3/uL 6.4   6.1   4.9    ?Hemoglobin  11.1 - 15.9 g/dL 13.1   13.2   12.7    ?Hematocrit 34.0 - 46.6 % 39.6   40.2   38.4    ?Platelets 150 - 450 x10E3/uL 284   261   260    ? ? ?Lab Results  ?Component Value Date/Time  ? VD25OH 48.9 09/11/2021 08:10 AM  ? VD25OH 33.9 10/23/2020 10:19 AM  ? ? ?Clinical ASCVD: No  ?The 10-year ASCVD risk score (Arnett DK, et al., 2019) is: 30.6% ?  Values used to calculate the score: ?    Age: 71 years ?    Sex: Female ?    Is Non-Hispanic African American: No ?    Diabetic: Yes ?    Tobacco smoker: No ?    Systolic Blood Pressure: 175 mmHg ?    Is BP treated: Yes ?    HDL Cholesterol: 51 mg/dL ?    Total Cholesterol: 155 mg/dL   ? ?Other: (CHADS2VASc if Afib, PHQ9 if depression, MMRC or CAT for COPD, ACT, DEXA) ? ?Social History  ? ?Tobacco Use  ?Smoking Status Former  ? Types: Cigarettes  ? Quit date: 07/06/1988  ? Years  since quitting: 33.3  ?Smokeless Tobacco Never  ?Tobacco Comments  ? intermittent for a few months  ? ?BP Readings from Last 3 Encounters:  ?09/29/21 (!) 156/108  ?09/11/21 122/60  ?05/14/21 135/80  ? ?Pulse Readings from Last 3 Encounters:  ?09/29/21 (!) 102  ?09/11/21 66  ?05/14/21 66  ? ?Wt Readings from Last 3 Encounters:  ?09/29/21 174 lb 9.6 oz (79.2 kg)  ?09/11/21 173 lb 3.2 oz (78.6 kg)  ?05/14/21 175 lb 6.4 oz (79.6 kg)  ? ? ?Assessment: Review of patient past medical history, allergies, medications, health status, including review of consultants reports, laboratory and other test data, was performed as part of comprehensive evaluation and provision of chronic care management services.  ? ?SDOH:  (Social Determinants of Health) assessments and interventions performed:  ? ? ?CCM Care Plan ? ?Allergies  ?Allergen Reactions  ? Codeine Nausea And Vomiting  ? Penicillins Rash  ? ? ?Medications Reviewed Today   ? ? Reviewed by Lavera Guise, [facility] (Pharmacist) on 10/28/21 at [phone]  Med List Status: <None>  ? ?Medication Order Taking? Sig Documenting Provider Last Dose Status Informant  ?calcium gluconate 500 MG tablet [id number] No Take 1 tablet (500 mg total) 2 (two) times daily by mouth. Orlena Sheldon, PA-C Taking Active Self  ?Cholecalciferol (VITAMIN D) 2000 units tablet [id number] No Take 1 tablet (2,000 Units total) by mouth daily. Orlena Sheldon, PA-C Taking Active Self  ?dapagliflozin propanediol (FARXIGA) 10 MG TABS tablet [id number] No Take 1 tablet (10 mg total) by mouth daily before breakfast. Loman Brooklyn, FNP Taking Active   ?         ?Med Note Blanca Friend, Mathew Postiglione D   Tue Oct 28, 2021  9:49 AM) Via AZ&me patient assistance program ?  ?diazepam (VALIUM) 2 MG tablet [id number] No Take 1 tablet (2 mg total) by mouth every 6 (six) hours as needed for anxiety. Alycia Rossetti, MD Taking Active   ?glucose blood test strip [id number] No Please dispense as OneTouch Ultra 2. Use as directed to monitor FSBS 1x  weekly. Dx: E11.9. Buelah Manis, Modena Nunnery, MD Taking Active   ?Lancets (ONETOUCH ULTRASOFT) lancets [id number] No Use as directed to monitor FSBS 1x weekly. Dx: E11.9. Alycia Rossetti, MD Taking Active   ?metFORMIN (GLUCOPHAGE) 1000 MG tablet [id number] No  TAKE 1 TABLET BY MOUTH  DAILY WITH BREAKFAST Hendricks Limes F, FNP Taking Active   ?metoprolol succinate (TOPROL-XL) 25 MG 24 hr tablet [id number] No TAKE 1 TABLET BY MOUTH ONCE DAILY Loman Brooklyn, FNP Taking Active   ?metroNIDAZOLE (METROGEL) 0.75 % gel [id number] No Apply 1 application topically as needed. [provider] Taking Active   ?Multiple Vitamin (MULTIVITAMIN WITH MINERALS) TABS tablet [id number] No Take 1 tablet by mouth daily. [provider] Taking Active   ?Omega-3 Fatty Acids (FISH OIL) 1000 MG CAPS [id number] No Take 1 capsule (1,000 mg total) by mouth 2 (two) times daily. Patrick, Modena Nunnery, MD Taking Active Self  ?omeprazole (PRILOSEC) 20 MG capsule [id number] No Take 1 capsule (20 mg total) by mouth daily as needed. Hendricks Limes F, FNP Taking Active   ?ramipril (ALTACE) 10 MG capsule [id number] No TAKE 1 CAPSULE BY MOUTH  DAILY Loman Brooklyn, FNP Taking Active   ?simvastatin (ZOCOR) 20 MG tablet [id number] No TAKE 1 TABLET BY MOUTH AT  BEDTIME Hendricks Limes F, FNP Taking Active   ?vitamin B-12 (CYANOCOBALAMIN) 100 MCG tablet [id number] No Take 1 tablet (100 mcg total) by mouth daily. Buelah Manis, Modena Nunnery, MD Taking Active Self  ?Med List Note Lavera Guise [facility] 10/16/21 1038): [facility] to [facility]--az&me patient assistance  ? ?  ?  ? ?  ? ? ?Patient Active Problem List  ? Diagnosis Date Noted  ? Aortic atherosclerosis ([place]) 05/14/2021  ? Stage 3a chronic kidney disease ([place]) 05/14/2021  ? Osteoporosis 01/29/2021  ? Panic attacks 10/28/2020  ? Controlled substance agreement signed 10/28/2020  ? Obesity (BMI 30.0-34.9) 10/28/2020  ? Diastolic dysfunction 21/05/5519  ? Reduced libido 07/27/2018  ? HTN (hypertension)  08/29/2012  ? HLD (hyperlipidemia) 08/29/2012  ? Vitamin D deficiency 08/29/2012  ? Type 2 diabetes mellitus with hyperglycemia, without long-term current use of insulin ([place]) 08/29/2012  ? Rosacea 08/29/2012

## 2021-10-16 NOTE — Patient Instructions (Signed)
Visit Information ? ?Following are the goals we discussed today:  ?Current Barriers:  ?Unable to independently afford treatment regimen ?Unable to achieve control of T2DM  ? ?Pharmacist Clinical Goal(s):  ?patient will verbalize ability to afford treatment regimen ?achieve control of T2DM/CKD as evidenced by GOAL A1C, GFR GOAL through collaboration with PharmD and provider.  ? ? ?Interventions: ?1:1 collaboration with Loman Brooklyn, FNP regarding development and update of comprehensive plan of care as evidenced by provider attestation and co-signature ?Inter-disciplinary care team collaboration (see longitudinal plan of care) ?Comprehensive medication review performed; medication list updated in electronic medical record ? ?Diabetes: New goal. ?Controlled; current treatment: METFORMIN;  ?Given GFR 49 (CKD3a), would like to start [place] (patient unable to afford) ?Will apply for patient assistance via AZ&me ?We can look at decreasing/discontinuing metformin in the future ?Discussed meal planning options and Plate method for healthy eating ?LIMIT/avoid sugary drinks and desserts ?Incorporate balanced protein, non starchy veggies, 1 serving of carbohydrate with each meal ?Increase water intake ?Increase physical activity as able ?Current exercise: encouraged ?Assessed patient finances. Application sent to az&me patient assistance  ? ? ?Patient Goals/Self-Care Activities ?patient will:  ?- take medications as prescribed as evidenced by patient report and record review ?check glucose daily or if symptomatic, document, and provide at future appointments ?collaborate with provider on medication access solutions ?target a minimum of 150 minutes of moderate intensity exercise weekly ?engage in dietary modifications by FOLLOWING A HEART HEALTHY DIET/HEALTHY PLATE METHOD ? ? ? ?Plan: Telephone follow up appointment with care management team member scheduled for:  3 months ? ?Signature ?Regina Eck, PharmD,  BCPS ?Clinical Pharmacist, [facility] ?[place]  [facility] Phone [phone] ? ? ?Please call the care guide team at [phone] if you need to cancel or reschedule your appointment.  ? ?Patient verbalizes understanding of instructions and care plan provided today and agrees to view in [place]. Active MyChart status confirmed with patient.   ? ?

## 2021-10-23 ENCOUNTER — Ambulatory Visit: Payer: Medicare Other | Admitting: Cardiology

## 2021-11-02 DIAGNOSIS — E1165 Type 2 diabetes mellitus with hyperglycemia: Secondary | ICD-10-CM

## 2021-11-02 DIAGNOSIS — N1831 Chronic kidney disease, stage 3a: Secondary | ICD-10-CM

## 2021-11-11 NOTE — Progress Notes (Deleted)
?Cardiology Office Note:   ? ?Date:  11/11/2021  ? ?ID:  Denise Meadows, DOB 06/10/1951, MRN [id number] ? ?PCP:  Loman Brooklyn, FNP ?  ?[facility] Providers ?Cardiologist:  None { ? ? ?Referring MD: Loman Brooklyn, FNP  ? ? ?History of Present Illness:   ? ?Denise Meadows is a 71 y.o. female with a hx of HTN, HLD, aortic atherosclerosis, DMII, and NASH who was previously followed by Dr. Meda Coffee who now returns to clinic for follow-up. ? ?Patient was initially seen by Dr. Meda Coffee in 02/2019 for increasing dyspnea on exertion. Coronary CTA 03/2019 showed minimal CAD with Ca score 46 (66%). TTE 07/2018 showed EF 60-65%, G2DD, normal RV, no significant valve disease.  ? ?Today, *** ? ?Past Medical History:  ?Diagnosis Date  ? Aortic atherosclerosis ([place])   ? Complication of anesthesia   ? DDD (degenerative disc disease)   ? Diabetes mellitus   ? Diastolic dysfunction   ? Elevated liver function tests   ? eval by dr Leonides Grills to fatty liver 2008  ? GERD (gastroesophageal reflux disease)   ? Hiatal hernia   ? Hyperlipidemia   ? Hypertension   ? NASH (nonalcoholic steatohepatitis)   ? Nephrolithiasis   ? Osteoporosis 01/29/2021  ? PONV (postoperative nausea and vomiting)   ? heel surgery  ? Pulmonary nodule   ? no change-1995  ? Rosacea   ? Ulcer   ? gastric ulcer, antritis-egd 09/06/08  ? Vitamin D deficiency   ? ? ?Past Surgical History:  ?Procedure Laterality Date  ? BREAST CYST ASPIRATION Left 2005  ? BREAST SURGERY    ? benign breast nodules 2005  ? CHOLECYSTECTOMY    ? COLONOSCOPY  2008  ? Dr Medoff-Normal  ? DILATION AND CURETTAGE, DIAGNOSTIC / THERAPEUTIC    ? ESOPHAGOGASTRODUODENOSCOPY    ? FRACTURE SURGERY    ? MALONEY DILATION  02/08/2012  ? Procedure: MALONEY DILATION;  Surgeon: Danie Binder, MD;  Location: [facility];  Service: Endoscopy;  Laterality: N/A;  ? SAVORY DILATION  02/08/2012  ? Procedure: SAVORY DILATION;  Surgeon: Danie Binder, MD;  Location: [facility];  Service: Endoscopy;   Laterality: N/A;  ? SPINE SURGERY    ? ? ?Current Medications: ?No outpatient medications have been marked as taking for the 11/12/21 encounter (Appointment) with Freada Bergeron, MD.  ?  ? ?Allergies:   Codeine and Penicillins  ? ?Social History  ? ?Socioeconomic History  ? Marital status: Married  ?  Spouse name: Not on file  ? Number of children: 2  ? Years of education: Not on file  ? Highest education level: Not on file  ?Occupational History  ? Occupation: homemaker  ?Tobacco Use  ? Smoking status: Former  ?  Types: Cigarettes  ?  Quit date: 07/06/1988  ?  Years since quitting: 33.3  ? Smokeless tobacco: Never  ? Tobacco comments:  ?  intermittent for a few months  ?Vaping Use  ? Vaping Use: Never used  ?Substance and Sexual Activity  ? Alcohol use: Not Currently  ?  Comment: occasional mixed drink couple per mo  ? Drug use: No  ? Sexual activity: Not on file  ?Other Topics Concern  ? Not on file  ?Social History Narrative  ? Lives w/ husband  ? ?Social Determinants of Health  ? ?Financial Resource Strain: Medium Risk  ? Difficulty of Paying Living Expenses: Somewhat hard  ?Food Insecurity: Not on file  ?Transportation  Needs: No Transportation Needs  ? Lack of Transportation (Medical): No  ? Lack of Transportation (Non-Medical): No  ?Physical Activity: Not on file  ?Stress: Not on file  ?Social Connections: Not on file  ?  ? ?Family History: ?The patient's ***family history includes COPD in her father; Colon cancer in her maternal aunt and maternal grandfather; Hyperlipidemia in her mother; Osteoarthritis in her mother; Scoliosis in her son. There is no history of Heart disease or Breast cancer. ? ?ROS:   ?Please see the history of present illness.    ?*** All other systems reviewed and are negative. ? ?EKGs/Labs/Other Studies Reviewed:   ? ?The following studies were reviewed today: ?Coronary CTA 03/2019: ?FINDINGS: ?A 100 kV prospective scan was triggered in the descending thoracic ?aorta at 111 HU's.  Axial non-contrast 3 mm slices were carried out ?through the heart. The data set was analyzed on a dedicated work ?station and scored using the [place]. Gantry rotation speed ?was 250 msecs and collimation was .6 mm. No beta blockade and 0.8 mg ?of sl NTG was given. The 3D data set was reconstructed in 5% ?intervals of the 67-82 % of the R-R cycle. Diastolic phases were ?analyzed on a dedicated work station using MPR, MIP and VRT modes. ?The patient received 80 cc of contrast. ?  ?Aorta: Normal size. Mild calcifications in the aortic root. No ?dissection. ?  ?Aortic Valve:  Trileaflet.  No calcifications. ?  ?Coronary Arteries:  Normal coronary origin.  Right dominance. ?  ?RCA is a large dominant artery that gives rise to PDA and PLA. There ?is no plaque. ?  ?Left main is a large artery that gives rise to LAD and LCX arteries. ?Left main has no plaque. ?  ?LAD is a large vessel that gives rise to one diagonal artery and has ?minimal calcified plaque in the proximal LAD with stenosis 0-25%. ?  ?LCX is a non-dominant artery that gives rise to one large OM1 ?branch. There is no plaque. ?  ?Other findings: ?  ?Normal pulmonary vein drainage into the left atrium. ?  ?Normal left atrial appendage without a thrombus. ?  ?Normal size of the pulmonary artery. ?  ?IMPRESSION: ?1. Coronary calcium score of 46. This was 49 percentile for age and ?sex matched control. ?  ?2. Normal coronary origin with right dominance. ?  ?3. CAD-RADS 1. 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 Consider non-atherosclerotic causes of chest pain. Consider ?preventive therapy and risk factor modification. ? ?TTE 07/2018: ?Study Conclusions  ? ?- Left ventricle: The cavity size was normal. Systolic function was  ?  normal. The estimated ejection fraction was in the range of 60%  ?  to 65%. Wall motion was normal; there were no regional wall  ?  motion abnormalities. Features are consistent with a pseudonormal  ?  left  ventricular filling pattern, with concomitant abnormal  ?  relaxation and increased filling pressure (grade 2 diastolic  ?  dysfunction). Doppler parameters are consistent with  ?  indeterminate ventricular filling pressure.  ?- Aortic valve: Transvalvular velocity was within the normal range.  ?  There was no stenosis. There was no regurgitation.  ?- Mitral valve: Transvalvular velocity was within the normal range.  ?  There was no evidence for stenosis. There was trivial  ?  regurgitation.  ?- Right ventricle: The cavity size was normal. Wall thickness was  ?  normal. Systolic function was normal.  ?- Atrial septum: No defect or patent foramen  ovale was identified  ?  by color flow Doppler.  ?- Tricuspid valve: There was mild regurgitation.  ?- Pulmonary arteries: Systolic pressure was within the normal  ?  range. PA peak pressure: 25 mm Hg (S).  ? ?EKG:  EKG is *** ordered today.  The ekg ordered today demonstrates *** ? ?Recent Labs: ?09/11/2021: ALT 13; BUN 17; Creatinine, Ser 1.19; Hemoglobin 13.1; Platelets 284; Potassium 5.0; Sodium 137  ?Recent Lipid Panel ?   ?Component Value Date/Time  ? CHOL 155 09/11/2021 0810  ? TRIG 176 (H) 09/11/2021 0810  ? HDL 51 09/11/2021 0810  ? CHOLHDL 3.0 09/11/2021 0810  ? CHOLHDL 3.6 04/17/2020 0834  ? VLDL 35 (H) 10/07/2016 0846  ? [place] 74 09/11/2021 0810  ? [place] 91 04/17/2020 0834  ? ? ? ?Risk Assessment/Calculations:   ?{Does this patient have ATRIAL FIBRILLATION?[phone]} ? ?    ? ?Physical Exam:   ? ?VS:  There were no vitals taken for this visit.   ? ?Wt Readings from Last 3 Encounters:  ?09/29/21 174 lb 9.6 oz (79.2 kg)  ?09/11/21 173 lb 3.2 oz (78.6 kg)  ?05/14/21 175 lb 6.4 oz (79.6 kg)  ?  ? ?GEN: *** Well nourished, well developed in no acute distress ?HEENT: Normal ?NECK: No JVD; No carotid bruits ?LYMPHATICS: No lymphadenopathy ?CARDIAC: ***RRR, no murmurs, rubs, gallops ?RESPIRATORY:  Clear to auscultation without rales, wheezing or rhonchi  ?ABDOMEN:  Soft, non-tender, non-distended ?MUSCULOSKELETAL:  No edema; No deformity  ?SKIN: Warm and dry ?NEUROLOGIC:  Alert and oriented x 3 ?PSYCHIATRIC:  Normal affect  ? ?ASSESSMENT:   ? ?No diagnosis found. ?PLAN:   ? ?I

## 2021-11-12 ENCOUNTER — Ambulatory Visit: Payer: Medicare Other | Admitting: Cardiology

## 2021-11-12 ENCOUNTER — Encounter: Payer: Self-pay | Admitting: Cardiology

## 2021-11-12 VITALS — BP 138/82 | HR 62 | Ht 60.0 in | Wt 168.6 lb

## 2021-11-12 DIAGNOSIS — R0609 Other forms of dyspnea: Secondary | ICD-10-CM

## 2021-11-12 DIAGNOSIS — I10 Essential (primary) hypertension: Secondary | ICD-10-CM | POA: Diagnosis not present

## 2021-11-12 DIAGNOSIS — E119 Type 2 diabetes mellitus without complications: Secondary | ICD-10-CM

## 2021-11-12 DIAGNOSIS — E7800 Pure hypercholesterolemia, unspecified: Secondary | ICD-10-CM

## 2021-11-12 NOTE — Patient Instructions (Signed)
Medication Instructions:   Your physician recommends that you continue on your current medications as directed. Please refer to the Current Medication list given to you today.  *If you need a refill on your cardiac medications before your next appointment, please call your pharmacy*   Follow-Up: At [facility], you and your health needs are our priority.  As part of our continuing mission to provide you with exceptional heart care, we have created designated Provider Care Teams.  These Care Teams include your primary Cardiologist (physician) and Advanced Practice Providers (APPs -  Physician Assistants and Nurse Practitioners) who all work together to provide you with the care you need, when you need it.  We recommend signing up for the patient portal called "MyChart".  Sign up information is provided on this After Visit Summary.  MyChart is used to connect with patients for Virtual Visits (Telemedicine).  Patients are able to view lab/test results, encounter notes, upcoming appointments, etc.  Non-urgent messages can be sent to your provider as well.   To learn more about what you can do with MyChart, go to [email or web address].    Your next appointment:   1 year(s)  The format for your next appointment:   In Person  Provider:   Dr. Pemberton   Important Information About Sugar       

## 2021-11-12 NOTE — Progress Notes (Signed)
?Cardiology Office Note:   ? ?Date:  11/12/2021  ? ?ID:  Denise Meadows, DOB 1951/02/22, MRN [id number] ? ?PCP:  Loman Brooklyn, FNP ?  ?[facility] Providers ?Cardiologist:  None { ? ? ?Referring MD: Loman Brooklyn, FNP  ? ? ?History of Present Illness:   ? ?Denise Meadows is a 71 y.o. female with a hx of HTN, HLD, aortic atherosclerosis, DMII, and NASH who was previously followed by Dr. Meda Coffee who now returns to clinic for follow-up. ? ?Patient was initially seen by Dr. Meda Coffee in 02/2019 for increasing dyspnea on exertion. Coronary CTA 03/2019 showed minimal CAD with Ca score 46 (66%). TTE 07/2018 showed EF 60-65%, G2DD, normal RV, no significant valve disease.  ? ?Today, the patient states that she continues to have some DOE but not nearly as severe as before. ? ?She complains of an "annoying" pain just inferior to her left breast. This occurs randomly and infrequently, usually while sitting on the couch. It does not necessarily occur after she eats. She has noticed worsening pain when she lies down. Her pain has been ongoing since before starting [place]. She has not yet tried any treatments. Of note, she endorses having a hernia. ? ?She reports losing some weight. For exercise she has returned to walking. Also she is more conscientious of her diet and was started on Farxiga. She has been taking it for about 4 weeks and is tolerating her medicine so far. ? ?She does not monitor her blood pressure often. When she does it is usually averaging in the 734L systolic. However, she has noticed consistently higher blood pressures when presenting to clinic visits. ? ?She denies any palpitations, or peripheral edema. No lightheadedness, headaches, syncope, orthopnea, or PND. ? ? ?Past Medical History:  ?Diagnosis Date  ? Aortic atherosclerosis ([place])   ? Complication of anesthesia   ? DDD (degenerative disc disease)   ? Diabetes mellitus   ? Diastolic dysfunction   ? Elevated liver function tests   ? eval by dr  Leonides Grills to fatty liver 2008  ? GERD (gastroesophageal reflux disease)   ? Hiatal hernia   ? Hyperlipidemia   ? Hypertension   ? NASH (nonalcoholic steatohepatitis)   ? Nephrolithiasis   ? Osteoporosis 01/29/2021  ? PONV (postoperative nausea and vomiting)   ? heel surgery  ? Pulmonary nodule   ? no change-1995  ? Rosacea   ? Ulcer   ? gastric ulcer, antritis-egd 09/06/08  ? Vitamin D deficiency   ? ? ?Past Surgical History:  ?Procedure Laterality Date  ? BREAST CYST ASPIRATION Left 2005  ? BREAST SURGERY    ? benign breast nodules 2005  ? CHOLECYSTECTOMY    ? COLONOSCOPY  2008  ? Dr Medoff-Normal  ? DILATION AND CURETTAGE, DIAGNOSTIC / THERAPEUTIC    ? ESOPHAGOGASTRODUODENOSCOPY    ? FRACTURE SURGERY    ? MALONEY DILATION  02/08/2012  ? Procedure: MALONEY DILATION;  Surgeon: Danie Binder, MD;  [facility];  Service: Endoscopy;  Laterality: N/A;  ? SAVORY DILATION  02/08/2012  ? Procedure: SAVORY DILATION;  Surgeon: Danie Binder, MD;  Location: [facility];  Service: Endoscopy;  Laterality: N/A;  ? SPINE SURGERY    ? ? ?Current Medications: ?Current Meds  ?Medication Sig  ? calcium gluconate 500 MG tablet Take 1 tablet (500 mg total) 2 (two) times daily by mouth.  ? Cholecalciferol (VITAMIN D) 2000 units tablet Take 1 tablet (2,000 Units total) by mouth  daily.  ? dapagliflozin propanediol (FARXIGA) 10 MG TABS tablet Take 1 tablet (10 mg total) by mouth daily before breakfast.  ? diazepam (VALIUM) 2 MG tablet Take 1 tablet (2 mg total) by mouth every 6 (six) hours as needed for anxiety.  ? glucose blood test strip Please dispense as OneTouch Ultra 2. Use as directed to monitor FSBS 1x weekly. Dx: E11.9.  ? Lancets (ONETOUCH ULTRASOFT) lancets Use as directed to monitor FSBS 1x weekly. Dx: E11.9.  ? metFORMIN (GLUCOPHAGE) 1000 MG tablet TAKE 1 TABLET BY MOUTH  DAILY WITH BREAKFAST  ? metoprolol succinate (TOPROL-XL) 25 MG 24 hr tablet TAKE 1 TABLET BY MOUTH ONCE DAILY  ? metroNIDAZOLE  (METROGEL) 0.75 % gel Apply 1 application topically as needed.  ? Multiple Vitamin (MULTIVITAMIN WITH MINERALS) TABS tablet Take 1 tablet by mouth daily.  ? Omega-3 Fatty Acids (FISH OIL) 1000 MG CAPS Take 1 capsule (1,000 mg total) by mouth 2 (two) times daily.  ? ramipril (ALTACE) 10 MG capsule TAKE 1 CAPSULE BY MOUTH  DAILY  ? simvastatin (ZOCOR) 20 MG tablet TAKE 1 TABLET BY MOUTH AT  BEDTIME  ? vitamin B-12 (CYANOCOBALAMIN) 100 MCG tablet Take 1 tablet (100 mcg total) by mouth daily.  ?  ? ?Allergies:   Codeine and Penicillins  ? ?Social History  ? ?Socioeconomic History  ? Marital status: Married  ?  Spouse name: Not on file  ? Number of children: 2  ? Years of education: Not on file  ? Highest education level: Not on file  ?Occupational History  ? Occupation: homemaker  ?Tobacco Use  ? Smoking status: Former  ?  Types: Cigarettes  ?  Quit date: 07/06/1988  ?  Years since quitting: 33.3  ? Smokeless tobacco: Never  ? Tobacco comments:  ?  intermittent for a few months  ?Vaping Use  ? Vaping Use: Never used  ?Substance and Sexual Activity  ? Alcohol use: Not Currently  ?  Comment: occasional mixed drink couple per mo  ? Drug use: No  ? Sexual activity: Not on file  ?Other Topics Concern  ? Not on file  ?Social History Narrative  ? Lives w/ husband  ? ?Social Determinants of Health  ? ?Financial Resource Strain: Medium Risk  ? Difficulty of Paying Living Expenses: Somewhat hard  ?Food Insecurity: Not on file  ?Transportation Needs: No Transportation Needs  ? Lack of Transportation (Medical): No  ? Lack of Transportation (Non-Medical): No  ?Physical Activity: Not on file  ?Stress: Not on file  ?Social Connections: Not on file  ?  ? ?Family History: ?The patient's family history includes COPD in her father; Colon cancer in her maternal aunt and maternal grandfather; Hyperlipidemia in her mother; Osteoarthritis in her mother; Scoliosis in her son. There is no history of Heart disease or Breast cancer. ? ?ROS:    ?Review of Systems  ?Constitutional:  Negative for chills and fever.  ?HENT:  Negative for nosebleeds and sore throat.   ?Eyes:  Negative for discharge.  ?Respiratory:  Positive for shortness of breath. Negative for cough.   ?Cardiovascular:  Positive for chest pain. Negative for palpitations, orthopnea, claudication, leg swelling and PND.  ?Gastrointestinal:  Negative for blood in stool and heartburn.  ?Genitourinary:  Negative for dysuria.  ?Musculoskeletal:  Negative for falls.  ?Neurological:  Negative for tremors and weakness.  ?Endo/Heme/Allergies:  Negative for environmental allergies.  ?Psychiatric/Behavioral:  Negative for depression and substance abuse.   ? ? ?EKGs/Labs/Other Studies Reviewed:   ? ?  The following studies were reviewed today: ? ?Coronary CTA 03/2019: ?FINDINGS: ?A 100 kV prospective scan was triggered in the descending thoracic ?aorta at 111 HU's. Axial non-contrast 3 mm slices were carried out ?through the heart. The data set was analyzed on a dedicated work ?station and scored using the [place]. Gantry rotation speed ?was 250 msecs and collimation was .6 mm. No beta blockade and 0.8 mg ?of sl NTG was given. The 3D data set was reconstructed in 5% ?intervals of the 67-82 % of the R-R cycle. Diastolic phases were ?analyzed on a dedicated work station using MPR, MIP and VRT modes. ?The patient received 80 cc of contrast. ?  ?Aorta: Normal size. Mild calcifications in the aortic root. No ?dissection. ?  ?Aortic Valve:  Trileaflet.  No calcifications. ?  ?Coronary Arteries:  Normal coronary origin.  Right dominance. ?  ?RCA is a large dominant artery that gives rise to PDA and PLA. There ?is no plaque. ?  ?Left main is a large artery that gives rise to LAD and LCX arteries. ?Left main has no plaque. ?  ?LAD is a large vessel that gives rise to one diagonal artery and has ?minimal calcified plaque in the proximal LAD with stenosis 0-25%. ?  ?LCX is a non-dominant artery that gives rise  to one large OM1 ?branch. There is no plaque. ?  ?Other findings: ?  ?Normal pulmonary vein drainage into the left atrium. ?  ?Normal left atrial appendage without a thrombus. ?  ?Normal size of the pulmonary artery. ?  ?IM

## 2021-11-20 ENCOUNTER — Telehealth: Payer: Self-pay

## 2021-11-20 NOTE — Telephone Encounter (Signed)
Received notification from [employer] regarding approval for [facility]. Patient assistance approved from 09/16/21 to 07/05/22.  LAST SHIPMENT WENT OUT 11/19/21  Phone: [phone]

## 2021-11-26 ENCOUNTER — Telehealth: Payer: Medicare Other

## 2021-12-16 ENCOUNTER — Other Ambulatory Visit: Payer: Self-pay | Admitting: Family Medicine

## 2021-12-16 DIAGNOSIS — Z1231 Encounter for screening mammogram for malignant neoplasm of breast: Secondary | ICD-10-CM

## 2022-01-05 NOTE — Progress Notes (Signed)
Erroneous encounter. Please disregard.

## 2022-01-13 ENCOUNTER — Encounter: Payer: Self-pay | Admitting: *Deleted

## 2022-01-14 ENCOUNTER — Ambulatory Visit (INDEPENDENT_AMBULATORY_CARE_PROVIDER_SITE_OTHER): Payer: Medicare Other | Admitting: Family Medicine

## 2022-01-14 ENCOUNTER — Encounter: Payer: Self-pay | Admitting: Family Medicine

## 2022-01-14 VITALS — BP 118/72 | HR 97 | Temp 97.1°F | Ht 61.0 in | Wt 163.4 lb

## 2022-01-14 DIAGNOSIS — S39012A Strain of muscle, fascia and tendon of lower back, initial encounter: Secondary | ICD-10-CM

## 2022-01-14 MED ORDER — METHOCARBAMOL 500 MG PO TABS: 500.0000 mg | ORAL_TABLET | Freq: Three times a day (TID) | ORAL | 0 refills | Status: DC | PRN

## 2022-01-14 MED ORDER — PREDNISONE 20 MG PO TABS: 20.0000 mg | ORAL_TABLET | Freq: Every day | ORAL | 0 refills | Status: AC

## 2022-01-14 NOTE — Progress Notes (Addendum)
Assessment & Plan:  1. Back strain, initial encounter Education provided on thoracic back pain exercises. Encouraged Tylenol 1,000 mg three times per day as needed, heating pad, muscle rub, Prednisone, and Robaxin.  - predniSONE (DELTASONE) 20 MG tablet; Take 1 tablet (20 mg total) by mouth daily with breakfast for 5 days.  Dispense: 5 tablet; Refill: 0 - methocarbamol (ROBAXIN) 500 MG tablet; Take 1 tablet (500 mg total) by mouth every 8 (eight) hours as needed for muscle spasms.  Dispense: 60 tablet; Refill: 0   Follow up plan: Return if symptoms worsen or fail to improve.  Hendricks Limes, MSN, APRN, FNP-C [facility]  Subjective:   Patient ID: Denise Meadows, female    DOB: [birth year], 71 y.o.   MRN: [id number]  HPI: Denise Meadows is a 71 y.o. female presenting on 01/14/2022 for Back Pain (Patient states she started to have lower back pain after moving stuff in her storage unit on Saturday. )  Patient reports back pain in the middle of her back that started four days ago after she was moving stuff in her storage building.   ROS: Negative unless specifically indicated above in HPI.   Relevant past medical history reviewed and updated as indicated.   Allergies and medications reviewed and updated.   Current Outpatient Medications:    calcium gluconate 500 MG tablet, Take 1 tablet (500 mg total) 2 (two) times daily by mouth., Disp: 60 tablet, Rfl: 11   Cholecalciferol (VITAMIN D) 2000 units tablet, Take 1 tablet (2,000 Units total) by mouth daily., Disp: 90 tablet, Rfl: 2   dapagliflozin propanediol (FARXIGA) 10 MG TABS tablet, Take 1 tablet (10 mg total) by mouth daily before breakfast., Disp: 90 tablet, Rfl: 6   diazepam (VALIUM) 2 MG tablet, Take 1 tablet (2 mg total) by mouth every 6 (six) hours as needed for anxiety., Disp: 20 tablet, Rfl: 0   glucose blood test strip, Please dispense as OneTouch Ultra 2. Use as directed to monitor FSBS 1x weekly. Dx:  E11.9., Disp: 100 each, Rfl: 3   Lancets (ONETOUCH ULTRASOFT) lancets, Use as directed to monitor FSBS 1x weekly. Dx: E11.9., Disp: 100 each, Rfl: 3   metFORMIN (GLUCOPHAGE) 1000 MG tablet, TAKE 1 TABLET BY MOUTH  DAILY WITH BREAKFAST, Disp: 90 tablet, Rfl: 3   metoprolol succinate (TOPROL-XL) 25 MG 24 hr tablet, TAKE 1 TABLET BY MOUTH ONCE DAILY, Disp: 90 tablet, Rfl: 3   metroNIDAZOLE (METROGEL) 0.75 % gel, Apply 1 application topically as needed., Disp: , Rfl:    Multiple Vitamin (MULTIVITAMIN WITH MINERALS) TABS tablet, Take 1 tablet by mouth daily., Disp: , Rfl:    Omega-3 Fatty Acids (FISH OIL) 1000 MG CAPS, Take 1 capsule (1,000 mg total) by mouth 2 (two) times daily., Disp: , Rfl: 0   omeprazole (PRILOSEC) 20 MG capsule, Take 1 capsule (20 mg total) by mouth daily as needed., Disp: 90 capsule, Rfl: 1   ramipril (ALTACE) 10 MG capsule, TAKE 1 CAPSULE BY MOUTH  DAILY, Disp: 90 capsule, Rfl: 3   simvastatin (ZOCOR) 20 MG tablet, TAKE 1 TABLET BY MOUTH AT  BEDTIME, Disp: 90 tablet, Rfl: 3   vitamin B-12 (CYANOCOBALAMIN) 100 MCG tablet, Take 1 tablet (100 mcg total) by mouth daily., Disp: , Rfl:   Allergies  Allergen Reactions   Acetaminophen Nausea And Vomiting   Codeine Nausea And Vomiting   Penicillins Rash    Objective:   BP 118/72   Pulse 97  Temp (!) 97.1 F (36.2 C) (Temporal)   Ht '5\' 1"'$  (1.549 m)   Wt 163 lb 6.4 oz (74.1 kg)   SpO2 98%   BMI 30.87 kg/m    Physical Exam Vitals reviewed.  Constitutional:      General: She is not in acute distress.    Appearance: Normal appearance. She is not ill-appearing, toxic-appearing or diaphoretic.  HENT:     Head: Normocephalic and atraumatic.  Eyes:     General: No scleral icterus.       Right eye: No discharge.        Left eye: No discharge.     Conjunctiva/sclera: Conjunctivae normal.  Cardiovascular:     Rate and Rhythm: Normal rate.  Pulmonary:     Effort: Pulmonary effort is normal. No respiratory distress.   Musculoskeletal:        General: Normal range of motion.     Cervical back: Normal range of motion.     Thoracic back: Tenderness present. No bony tenderness.  Skin:    General: Skin is warm and dry.     Capillary Refill: Capillary refill takes less than 2 seconds.  Neurological:     General: No focal deficit present.     Mental Status: She is alert and oriented to person, place, and time. Mental status is at baseline.  Psychiatric:        Mood and Affect: Mood normal.        Behavior: Behavior normal.        Thought Content: Thought content normal.        Judgment: Judgment normal.

## 2022-01-14 NOTE — Patient Instructions (Signed)
Tylenol 1,000 mg three times per day as needed Heating pad Muscle rub Prednisone Muscle relaxer

## 2022-01-21 ENCOUNTER — Encounter: Payer: Self-pay | Admitting: Family Medicine

## 2022-02-03 ENCOUNTER — Ambulatory Visit: Payer: Self-pay | Admitting: *Deleted

## 2022-02-03 DIAGNOSIS — N1831 Chronic kidney disease, stage 3a: Secondary | ICD-10-CM

## 2022-02-03 NOTE — Chronic Care Management (AMB) (Signed)
  Chronic Care Management   Note  02/03/2022 Name: Denise Meadows MRN: [id number] DOB: 18-Sep-1950   Due to changes in the Chronic Care Management program, I am removing myself as the [place] from the Care Team and closing [place].   Patient has an open Care Plan with another CCM team member, Lottie Dawson, PharmD.  I will forward this case closure encounter to the other team member(s). Patient does not have a current CCM referral placed since 11/03/21. CCM enrollment status changed to "not enrolled".   Patient's PCP can place a new referral if the they needs Care Management or Care Coordination services in the future.  Chong Sicilian, BSN, RN-BC Proofreader Dial: [phone]

## 2022-02-03 NOTE — Patient Instructions (Signed)
Denise Meadows  At some point during the past 4 years, I have worked with you through the [facility] at [place].  Due to program changes I am removing myself from your care team.   If you are currently active with another CCM Team Member, you will remain active with them unless they reach out to you with additional information.   If you feel that you need services in the future,  please talk with your primary care provider and request a new referral for Care Management or Care Coordination services. This does not affect your status as a patient at [facility].   Thank you for allowing me to participate in your your healthcare journey.  Chong Sicilian, BSN, RN-BC Proofreader Dial: [phone]

## 2022-02-03 NOTE — Progress Notes (Signed)
[name], do you want to continue seeing this patient for prescription assistance?

## 2022-02-05 NOTE — Addendum Note (Signed)
Addended by: Hendricks Limes F on: 02/05/2022 01:03 PM   Modules accepted: Orders

## 2022-02-05 NOTE — Progress Notes (Signed)
Referral placed.

## 2022-02-09 ENCOUNTER — Ambulatory Visit
Admission: RE | Admit: 2022-02-09 | Discharge: 2022-02-09 | Disposition: A | Discharge status: Home / Self Care | Payer: Medicare Other | Source: Ambulatory Visit | Attending: Family Medicine | Admitting: Family Medicine

## 2022-02-09 DIAGNOSIS — Z1231 Encounter for screening mammogram for malignant neoplasm of breast: Secondary | ICD-10-CM

## 2022-02-13 ENCOUNTER — Telehealth: Payer: Self-pay | Admitting: Family Medicine

## 2022-02-13 NOTE — Telephone Encounter (Signed)
Left message for patient to call back and schedule Medicare Annual Wellness Visit (AWV) to be completed by video or phone.   Last AWV: 01/22/2021   Please schedule at anytime with [facility] Nurse Health Advisor     45 minute appointment  Any questions, please contact me at [phone]     

## 2022-02-16 ENCOUNTER — Telehealth: Payer: Self-pay

## 2022-02-16 ENCOUNTER — Ambulatory Visit (INDEPENDENT_AMBULATORY_CARE_PROVIDER_SITE_OTHER): Payer: Medicare Other

## 2022-02-16 VITALS — Wt 163.0 lb

## 2022-02-16 DIAGNOSIS — Z0000 Encounter for general adult medical examination without abnormal findings: Secondary | ICD-10-CM

## 2022-02-16 NOTE — Patient Instructions (Signed)
Denise Meadows , Thank you for taking time to come for your Medicare Wellness Visit. I appreciate your ongoing commitment to your health goals. Please review the following plan we discussed and let me know if I can assist you in the future.   Screening recommendations/referrals: Colonoscopy: Done 02/08/2012 - Repeat in 10 years *due - wants to discuss new GI provider with [facility] Mammogram: Done 02/09/2022 - Repeat annually  Bone Density: Done 01/29/2021 - Repeat every 2 years  Recommended yearly ophthalmology/optometry visit for glaucoma screening and checkup Recommended yearly dental visit for hygiene and checkup  Vaccinations: Influenza vaccine: Done 04/03/2021 - Repeat annually  Pneumococcal vaccine: Done 06/03/2016 & 06/07/2017 Tdap vaccine: Done 09/20/2014 - Repeat in 10 years  Shingles vaccine: Zostavax done 2016 - due for Shingrix which is 2 doses 2-6 months apart and over 90% effective     Covid-19: Done 07/26/2019, 08/15/2019, & 04/12/2020  Advanced directives: Please bring a copy of your health care power of attorney and living will to the office to be added to your chart at your convenience.   Conditions/risks identified: Aim for 30 minutes of exercise or brisk walking, 6-8 glasses of water, and 5 servings of fruits and vegetables each day.   Next appointment: Follow up in one year for your annual wellness visit    Preventive Care 65 Years and Older, Female Preventive care refers to lifestyle choices and visits with your health care provider that can promote health and wellness. What does preventive care include? A yearly physical exam. This is also called an annual well check. Dental exams once or twice a year. Routine eye exams. Ask your health care provider how often you should have your eyes checked. Personal lifestyle choices, including: Daily care of your teeth and gums. Regular physical activity. Eating a healthy diet. Avoiding tobacco and drug use. Limiting alcohol  use. Practicing safe sex. Taking low-dose aspirin every day. Taking vitamin and mineral supplements as recommended by your health care provider. What happens during an annual well check? The services and screenings done by your health care provider during your annual well check will depend on your age, overall health, lifestyle risk factors, and family history of disease. Counseling  Your health care provider may ask you questions about your: Alcohol use. Tobacco use. Drug use. Emotional well-being. Home and relationship well-being. Sexual activity. Eating habits. History of falls. Memory and ability to understand (cognition). Work and work Statistician. Reproductive health. Screening  You may have the following tests or measurements: Height, weight, and BMI. Blood pressure. Lipid and cholesterol levels. These may be checked every 5 years, or more frequently if you are over 48 years old. Skin check. Lung cancer screening. You may have this screening every year starting at age 61 if you have a 30-pack-year history of smoking and currently smoke or have quit within the past 15 years. Fecal occult blood test (FOBT) of the stool. You may have this test every year starting at age 35. Flexible sigmoidoscopy or colonoscopy. You may have a sigmoidoscopy every 5 years or a colonoscopy every 10 years starting at age 8. Hepatitis C blood test. Hepatitis B blood test. Sexually transmitted disease (STD) testing. Diabetes screening. This is done by checking your blood sugar (glucose) after you have not eaten for a while (fasting). You may have this done every 1-3 years. Bone density scan. This is done to screen for osteoporosis. You may have this done starting at age 24. Mammogram. This may be done every 1-2  years. Talk to your health care provider about how often you should have regular mammograms. Talk with your health care provider about your test results, treatment options, and if necessary,  the need for more tests. Vaccines  Your health care provider may recommend certain vaccines, such as: Influenza vaccine. This is recommended every year. Tetanus, diphtheria, and acellular pertussis (Tdap, Td) vaccine. You may need a Td booster every 10 years. Zoster vaccine. You may need this after age 2. Pneumococcal 13-valent conjugate (PCV13) vaccine. One dose is recommended after age 84. Pneumococcal polysaccharide (PPSV23) vaccine. One dose is recommended after age 63. Talk to your health care provider about which screenings and vaccines you need and how often you need them. This information is not intended to replace advice given to you by your health care provider. Make sure you discuss any questions you have with your health care provider. Document Released: 07/19/2015 Document Revised: 03/11/2016 Document Reviewed: 04/23/2015 Elsevier Interactive Patient Education  2017 [place] Prevention in the Home Falls can cause injuries. They can happen to people of all ages. There are many things you can do to make your home safe and to help prevent falls. What can I do on the outside of my home? Regularly fix the edges of walkways and driveways and fix any cracks. Remove anything that might make you trip as you walk through a door, such as a raised step or threshold. Trim any bushes or trees on the path to your home. Use bright outdoor lighting. Clear any walking paths of anything that might make someone trip, such as rocks or tools. Regularly check to see if handrails are loose or broken. Make sure that both sides of any steps have handrails. Any raised decks and porches should have guardrails on the edges. Have any leaves, snow, or ice cleared regularly. Use sand or salt on walking paths during winter. Clean up any spills in your garage right away. This includes oil or grease spills. What can I do in the bathroom? Use night lights. Install grab bars by the toilet and in the  tub and shower. Do not use towel bars as grab bars. Use non-skid mats or decals in the tub or shower. If you need to sit down in the shower, use a plastic, non-slip stool. Keep the floor dry. Clean up any water that spills on the floor as soon as it happens. Remove soap buildup in the tub or shower regularly. Attach bath mats securely with double-sided non-slip rug tape. Do not have throw rugs and other things on the floor that can make you trip. What can I do in the bedroom? Use night lights. Make sure that you have a light by your bed that is easy to reach. Do not use any sheets or blankets that are too big for your bed. They should not hang down onto the floor. Have a firm chair that has side arms. You can use this for support while you get dressed. Do not have throw rugs and other things on the floor that can make you trip. What can I do in the kitchen? Clean up any spills right away. Avoid walking on wet floors. Keep items that you use a lot in easy-to-reach places. If you need to reach something above you, use a strong step stool that has a grab bar. Keep electrical cords out of the way. Do not use floor polish or wax that makes floors slippery. If you must use wax, use non-skid floor  wax. Do not have throw rugs and other things on the floor that can make you trip. What can I do with my stairs? Do not leave any items on the stairs. Make sure that there are handrails on both sides of the stairs and use them. Fix handrails that are broken or loose. Make sure that handrails are as long as the stairways. Check any carpeting to make sure that it is firmly attached to the stairs. Fix any carpet that is loose or worn. Avoid having throw rugs at the top or bottom of the stairs. If you do have throw rugs, attach them to the floor with carpet tape. Make sure that you have a light switch at the top of the stairs and the bottom of the stairs. If you do not have them, ask someone to add them for  you. What else can I do to help prevent falls? Wear shoes that: Do not have high heels. Have rubber bottoms. Are comfortable and fit you well. Are closed at the toe. Do not wear sandals. If you use a stepladder: Make sure that it is fully opened. Do not climb a closed stepladder. Make sure that both sides of the stepladder are locked into place. Ask someone to hold it for you, if possible. Clearly mark and make sure that you can see: Any grab bars or handrails. First and last steps. Where the edge of each step is. Use tools that help you move around (mobility aids) if they are needed. These include: Canes. Walkers. Scooters. Crutches. Turn on the lights when you go into a dark area. Replace any light bulbs as soon as they burn out. Set up your furniture so you have a clear path. Avoid moving your furniture around. If any of your floors are uneven, fix them. If there are any pets around you, be aware of where they are. Review your medicines with your doctor. Some medicines can make you feel dizzy. This can increase your chance of falling. Ask your doctor what other things that you can do to help prevent falls. This information is not intended to replace advice given to you by your health care provider. Make sure you discuss any questions you have with your health care provider. Document Released: 04/18/2009 Document Revised: 11/28/2015 Document Reviewed: 07/27/2014 Elsevier Interactive Patient Education  2017 [employer].

## 2022-02-16 NOTE — Telephone Encounter (Signed)
Attempted to contact - NA 

## 2022-02-16 NOTE — Telephone Encounter (Signed)
Her husband, Loriana Samad is a patient of [employer]. Danniella wants to know if she can switch to [facility]andowsky when [facility]. Thanks!

## 2022-02-16 NOTE — Progress Notes (Signed)
Subjective:   Denise Meadows is a 71 y.o. female who presents for Medicare Annual (Subsequent) preventive examination.  Virtual Visit via Telephone Note  I connected with  Denise Meadows on 02/16/22 at  9:00 AM EDT by telephone and verified that I am speaking with the correct person using two identifiers.  Location: Patient: Home Provider: [facility] Persons participating in the virtual visit: patient/Nurse Health Advisor   I discussed the limitations, risks, security and privacy concerns of performing an evaluation and management service by telephone and the availability of in person appointments. The patient expressed understanding and agreed to proceed.  Interactive audio and video telecommunications were attempted between this nurse and patient, however failed, due to patient having technical difficulties OR patient did not have access to video capability.  We continued and completed visit with audio only.  Some vital signs may be absent or patient reported.   Drevion Offord E Harpreet Pompey, LPN   Review of Systems     Cardiac Risk Factors include: advanced age (>57mn, >>41women);diabetes mellitus;dyslipidemia;hypertension;sedentary lifestyle;obesity (BMI >30kg/m2);Other (see comment), Risk factor comments: diastolic dysfunctin, atherosclerosis     Objective:    Today's Vitals   02/16/22 0903  Weight: 163 lb (73.9 kg)   Body mass index is 30.8 kg/m.     02/16/2022    9:09 AM 01/22/2021   10:23 AM 02/08/2012    7:40 AM  Advanced Directives  Does Patient Have a Medical Advance Directive? Yes No Patient does not have advance directive  Type of AScientist, forensicPower of [place] will    Copy of HRockledgein Chart? No - copy requested    Would patient like information on creating a medical advance directive?  No - Patient declined   Pre-existing out of facility DNR order (yellow form or pink MOST form)   No    Current Medications (verified) Outpatient  Encounter Medications as of 02/16/2022  Medication Sig   calcium gluconate 500 MG tablet Take 1 tablet (500 mg total) 2 (two) times daily by mouth.   Cholecalciferol (VITAMIN D) 2000 units tablet Take 1 tablet (2,000 Units total) by mouth daily.   dapagliflozin propanediol (FARXIGA) 10 MG TABS tablet Take 1 tablet (10 mg total) by mouth daily before breakfast.   diazepam (VALIUM) 2 MG tablet Take 1 tablet (2 mg total) by mouth every 6 (six) hours as needed for anxiety.   glucose blood test strip Please dispense as OneTouch Ultra 2. Use as directed to monitor FSBS 1x weekly. Dx: E11.9.   Lancets (ONETOUCH ULTRASOFT) lancets Use as directed to monitor FSBS 1x weekly. Dx: E11.9.   metFORMIN (GLUCOPHAGE) 1000 MG tablet TAKE 1 TABLET BY MOUTH  DAILY WITH BREAKFAST   methocarbamol (ROBAXIN) 500 MG tablet Take 1 tablet (500 mg total) by mouth every 8 (eight) hours as needed for muscle spasms.   metoprolol succinate (TOPROL-XL) 25 MG 24 hr tablet TAKE 1 TABLET BY MOUTH ONCE DAILY   metroNIDAZOLE (METROGEL) 0.75 % gel Apply 1 application topically as needed.   Multiple Vitamin (MULTIVITAMIN WITH MINERALS) TABS tablet Take 1 tablet by mouth daily.   Omega-3 Fatty Acids (FISH OIL) 1000 MG CAPS Take 1 capsule (1,000 mg total) by mouth 2 (two) times daily.   omeprazole (PRILOSEC) 20 MG capsule Take 1 capsule (20 mg total) by mouth daily as needed.   ramipril (ALTACE) 10 MG capsule TAKE 1 CAPSULE BY MOUTH  DAILY   simvastatin (ZOCOR) 20 MG tablet TAKE 1  TABLET BY MOUTH AT  BEDTIME   vitamin B-12 (CYANOCOBALAMIN) 100 MCG tablet Take 1 tablet (100 mcg total) by mouth daily.   No facility-administered encounter medications on file as of 02/16/2022.    Allergies (verified) Acetaminophen, Codeine, and Penicillins   History: Past Medical History:  Diagnosis Date   Aortic atherosclerosis (HCC)    Complication of anesthesia    DDD (degenerative disc disease)    Diabetes mellitus    Diastolic dysfunction     Elevated liver function tests    eval by dr Leonides Grills to fatty liver 2008   GERD (gastroesophageal reflux disease)    Hiatal hernia    Hyperlipidemia    Hypertension    NASH (nonalcoholic steatohepatitis)    Nephrolithiasis    Osteoporosis 01/29/2021   PONV (postoperative nausea and vomiting)    heel surgery   Pulmonary nodule    no change-1995   Rosacea    Ulcer    gastric ulcer, antritis-egd 09/06/08   Vitamin D deficiency    Past Surgical History:  Procedure Laterality Date   BREAST CYST ASPIRATION Left 2005   BREAST SURGERY     benign breast nodules 2005   CHOLECYSTECTOMY     COLONOSCOPY  2008   Dr Medoff-Normal   DILATION AND CURETTAGE, DIAGNOSTIC / THERAPEUTIC     ESOPHAGOGASTRODUODENOSCOPY     FRACTURE SURGERY     MALONEY DILATION  02/08/2012   Procedure: MALONEY DILATION;  Surgeon: Danie Binder, MD;  [facility];  Service: Endoscopy;  Laterality: N/A;   SAVORY DILATION  02/08/2012   Procedure: SAVORY DILATION;  Surgeon: Danie Binder, MD;  Location: [facility];  Service: Endoscopy;  Laterality: N/A;   SPINE SURGERY     Family History  Problem Relation Age of Onset   Hyperlipidemia Mother    Osteoarthritis Mother    COPD Father    Colon cancer Maternal Aunt    Colon cancer Maternal Grandfather    Scoliosis Son    Heart disease Neg Hx    Breast cancer Neg Hx    Social History   Socioeconomic History   Marital status: Married    Spouse name: Not on file   Number of children: 2   Years of education: Not on file   Highest education level: Not on file  Occupational History   Occupation: homemaker  Tobacco Use   Smoking status: Former    Types: Cigarettes    Quit date: 07/06/1988    Years since quitting: 33.6   Smokeless tobacco: Never   Tobacco comments:    intermittent for a few months  Vaping Use   Vaping Use: Never used  Substance and Sexual Activity   Alcohol use: Not Currently    Comment: occasional mixed drink couple per  mo   Drug use: No   Sexual activity: Not on file  Other Topics Concern   Not on file  Social History Narrative   Lives w/ husband   Social Determinants of Health   Financial Resource Strain: Low Risk  (02/16/2022)   Overall Financial Resource Strain (CARDIA)    Difficulty of Paying Living Expenses: Not hard at all  Food Insecurity: No Food Insecurity (02/16/2022)   Hunger Vital Sign    Worried About Running Out of Food in the Last Year: Never true    Ran Out of Food in the Last Year: Never true  Transportation Needs: No Transportation Needs (02/16/2022)   [place] - Transportation  Lack of Transportation (Medical): No    Lack of Transportation (Non-Medical): No  Physical Activity: Sufficiently Active (02/16/2022)   Exercise Vital Sign    Days of Exercise per Week: 7 days    Minutes of Exercise per Session: 30 min  Stress: No Stress Concern Present (02/16/2022)   [place]    Feeling of Stress : Only a little  Social Connections: Socially Integrated (02/16/2022)   Social Connection and Isolation Panel [NHANES]    Frequency of Communication with Friends and Family: More than three times a week    Frequency of Social Gatherings with Friends and Family: More than three times a week    Attends Religious Services: More than 4 times per year    Active Member of [employer] or Organizations: Yes    Attends Music therapist: More than 4 times per year    Marital Status: Married    Tobacco Counseling Counseling given: Not Answered Tobacco comments: intermittent for a few months   Clinical Intake:  Pre-visit preparation completed: Yes  Pain : No/denies pain     BMI - recorded: 30.8 Nutritional Status: BMI > 30  Obese Nutritional Risks: None Diabetes: Yes CBG done?: No Did pt. bring in CBG monitor from home?: No  How often do you need to have someone help you when you read instructions, pamphlets, or  other written materials from your doctor or pharmacy?: 1 - Never  Diabetic? Nutrition Risk Assessment:  Has the patient had any N/V/D within the last 2 months?  No  Does the patient have any non-healing wounds?  No  Has the patient had any unintentional weight loss or weight gain?  No   Diabetes:  Is the patient diabetic?  Yes  If diabetic, was a CBG obtained today?  No  Did the patient bring in their glucometer from home?  No  How often do you monitor your CBG's? Once a week.   Financial Strains and Diabetes Management:  Are you having any financial strains with the device, your supplies or your medication?  CCM  Pharmacist is assisting  .  Does the patient want to be seen by Chronic Care Management for management of their diabetes?  No  Would the patient like to be referred to a Nutritionist or for Diabetic Management?  No   Diabetic Exams:  Diabetic Eye Exam: Completed 09/11/2021.   Diabetic Foot Exam: Completed 01/23/2021. Pt has been advised about the importance in completing this exam. Pt is scheduled for diabetic foot exam on 03/24/2022.    Interpreter Needed?: No  Information entered by :: Olawale Marney, LPN   Activities of Daily Living    02/16/2022    9:06 AM  In your present state of health, do you have any difficulty performing the following activities:  Hearing? 0  Vision? 0  Difficulty concentrating or making decisions? 0  Walking or climbing stairs? 0  Dressing or bathing? 0  Doing errands, shopping? 0  Preparing Food and eating ? N  Using the Toilet? N  In the past six months, have you accidently leaked urine? N  Do you have problems with loss of bowel control? Y  Comment IBS  Managing your Medications? N  Managing your Finances? N  Housekeeping or managing your Housekeeping? N    Patient Care Team: Loman Brooklyn, FNP as PCP - General (Family Medicine) Danie Binder, MD (Inactive) (Gastroenterology) Edythe Clarity, [facility] as Pharmacist  (Pharmacist)  Harlen Labs, MD as Referring Physician (Optometry) Blanca Friend Royce Macadamia, [facility] as Pharmacist (Family Medicine)  Indicate any recent Medical Services you may have received from other than [facility] providers in the past year (date may be approximate).     Assessment:   This is a routine wellness examination for Denise Meadows.  Hearing/Vision screen Hearing Screening - Comments:: Denies hearing difficulties   Vision Screening - Comments:: Wears rx glasses - up to date with routine eye exams with [facility] in [place] issues and exercise activities discussed: Current Exercise Habits: Home exercise routine, Type of exercise: walking, Time (Minutes): 30, Frequency (Times/Week): 7, Weekly Exercise (Minutes/Week): 210, Intensity: Mild, Exercise limited by: orthopedic condition(s);cardiac condition(s)   Goals Addressed             This Visit's Progress    AWV       02/16/2022 AWV Goal: Diabetes Management  Patient will maintain an A1C level below 8.0 Patient will not develop any diabetic foot complications Patient will not experience any hypoglycemic episodes over the next 3 months Patient will notify our office of any CBG readings outside of the provider recommended range by calling [phone] Patient will adhere to provider recommendations for diabetes management  Patient Self Management Activities take all medications as prescribed and report any negative side effects monitor and record blood sugar readings as directed adhere to a low carbohydrate diet that incorporates lean proteins, vegetables, whole grains, low glycemic fruits check feet daily noting any sores, cracks, injuries, or callous formations see PCP or podiatrist if she notices any changes in her legs, feet, or toenails Patient will visit PCP and have an A1C level checked every 3 to 6 months as directed  have a yearly eye exam to monitor for vascular changes associated with diabetes and will request that  the report be sent to her pcp.  consult with her PCP regarding any changes in her health or new or worsening symptoms      Patient Stated       02/16/22 -Hopes to stay as active and independent as she is now       Depression Screen    02/16/2022    9:08 AM 01/14/2022    3:48 PM 09/11/2021    8:07 AM 05/14/2021    8:12 AM 04/03/2021    8:21 AM 01/23/2021    8:41 AM 01/22/2021   10:28 AM  PHQ 2/9 Scores  PHQ - 2 Score 0 0 0 0 0 0 0  PHQ- 9 Score 1 0 0 0 0 0     Fall Risk    02/16/2022    9:04 AM 01/14/2022    3:48 PM 09/11/2021    8:07 AM 05/14/2021    8:12 AM 04/03/2021    8:20 AM  Fall Risk   Falls in the past year? 1 0 '1 1 1  '$ Number falls in past yr: 0  0 0 0  Injury with Fall? 0  0 0 1  Risk for fall due to : Orthopedic patient;History of fall(s)    History of fall(s)  Follow up Falls prevention discussed;Education provided  Falls prevention discussed Falls prevention discussed Falls evaluation completed    FALL RISK PREVENTION PERTAINING TO THE HOME:  Any stairs in or around the home? No  If so, are there any without handrails? No  Home free of loose throw rugs in walkways, pet beds, electrical cords, etc? Yes  Adequate lighting in your home to reduce  risk of falls? Yes   ASSISTIVE DEVICES UTILIZED TO PREVENT FALLS:  Life alert? No  Use of a cane, walker or w/c?  Uses walking stick if walking a long ways Grab bars in the bathroom? No  Shower chair or bench in shower? No  Elevated toilet seat or a handicapped toilet? No   TIMED UP AND GO:  Was the test performed? No . Telephonic visit  Cognitive Function:        02/16/2022    9:10 AM 01/22/2021   10:29 AM  6CIT Screen  What Year? 0 points 0 points  What month? 0 points 0 points  What time? 0 points 0 points  Count back from 20 0 points 0 points  Months in reverse 0 points 0 points  Repeat phrase 0 points 0 points  Total Score 0 points 0 points    Immunizations Immunization History  Administered Date(s)  Administered   Fluad Quad(high Dose 65+) 04/03/2021   Influenza, High Dose Seasonal PF 04/24/2017   Influenza,inj,Quad PF,6+ Mos 04/08/2016   Influenza-Unspecified 04/24/2017, 04/05/2018   PFIZER(Purple Top)SARS-COV-2 Vaccination 07/26/2019, 08/15/2019, 04/12/2020   Pneumococcal Conjugate-13 06/03/2016   Pneumococcal Polysaccharide-23 06/07/2017   Tdap 09/20/2014   Zoster, Live 03/25/2015    TDAP status: Up to date  Flu Vaccine status: Up to date  Pneumococcal vaccine status: Up to date  Covid-19 vaccine status: Completed vaccines  Qualifies for Shingles Vaccine? Yes   Zostavax completed Yes   Shingrix Completed?: No.    Education has been provided regarding the importance of this vaccine. Patient has been advised to call insurance company to determine out of pocket expense if they have not yet received this vaccine. Advised may also receive vaccine at local pharmacy or Health Dept. Verbalized acceptance and understanding.  Screening Tests Health Maintenance  Topic Date Due   Zoster Vaccines- Shingrix (1 of 2) Never done   COVID-19 Vaccine (4 - Pfizer risk series) 06/07/2020   COLONOSCOPY (Pts 45-51yr Insurance coverage will need to be confirmed)  02/07/2022   FOOT EXAM  01/23/2022   INFLUENZA VACCINE  02/03/2022   HEMOGLOBIN A1C  03/14/2022   OPHTHALMOLOGY EXAM  09/12/2022   DEXA SCAN  01/30/2023   MAMMOGRAM  02/10/2024   TETANUS/TDAP  09/19/2024   Pneumonia Vaccine 71 Years old  Completed   Hepatitis C Screening  Completed   HPV VACCINES  Aged Out    Health Maintenance  Health Maintenance Due  Topic Date Due   Zoster Vaccines- Shingrix (1 of 2) Never done   COVID-19 Vaccine (4 - Pfizer risk series) 06/07/2020   COLONOSCOPY (Pts 45-480yrInsurance coverage will need to be confirmed)  02/07/2022   FOOT EXAM  01/23/2022   INFLUENZA VACCINE  02/03/2022    Colorectal cancer screening: Type of screening: Colonoscopy. Completed 02/08/2012. Repeat every 10 years - Her  GI dr left, so she needs a new one - I offered referral, but she would rather do some research and then discuss with PCP at next visit  Mammogram status: Completed 02/09/2022. Repeat every year  Bone Density status: Completed 01/29/2021. Results reflect: Bone density results: OSTEOPOROSIS. Repeat every 2 years.  Lung Cancer Screening: (Low Dose CT Chest recommended if Age 71-80ears, 30 pack-year currently smoking OR have quit w/in 15years.) does not qualify.  Additional Screening:  Hepatitis C Screening: does qualify; Completed 04/08/2016  Vision Screening: Recommended annual ophthalmology exams for early detection of glaucoma and other disorders of the eye. Is the patient up to  date with their annual eye exam?  Yes  Who is the provider or what is the name of the office in which the patient attends annual eye exams? [place] If pt is not established with a provider, would they like to be referred to a provider to establish care? No .   Dental Screening: Recommended annual dental exams for proper oral hygiene  Community Resource Referral / Chronic Care Management: CRR required this visit?  No   CCM required this visit?  No      Plan:     I have personally reviewed and noted the following in the patient's chart:   Medical and social history Use of alcohol, tobacco or illicit drugs  Current medications and supplements including opioid prescriptions.  Functional ability and status Nutritional status Physical activity Advanced directives List of other physicians Hospitalizations, surgeries, and ER visits in previous 12 months Vitals Screenings to include cognitive, depression, and falls Referrals and appointments  In addition, I have reviewed and discussed with patient certain preventive protocols, quality metrics, and best practice recommendations. A written personalized care plan for preventive services as well as general preventive health recommendations were  provided to patient.     Sandrea Hammond, LPN   01/18/9677   Nurse Notes: None

## 2022-02-16 NOTE — Telephone Encounter (Signed)
Completely up to Shelah Lewandowsky, but I don't think she is accepting new patients.

## 2022-02-16 NOTE — Telephone Encounter (Signed)
I will accept her 

## 2022-02-16 NOTE — Telephone Encounter (Signed)
Patient aware.

## 2022-03-15 ENCOUNTER — Other Ambulatory Visit: Payer: Self-pay | Admitting: Family Medicine

## 2022-03-15 DIAGNOSIS — I10 Essential (primary) hypertension: Secondary | ICD-10-CM

## 2022-03-15 DIAGNOSIS — E782 Mixed hyperlipidemia: Secondary | ICD-10-CM

## 2022-03-24 ENCOUNTER — Encounter: Payer: Self-pay | Admitting: Family Medicine

## 2022-03-24 ENCOUNTER — Ambulatory Visit (INDEPENDENT_AMBULATORY_CARE_PROVIDER_SITE_OTHER): Payer: Medicare Other | Admitting: Family Medicine

## 2022-03-24 VITALS — BP 105/61 | HR 72 | Temp 97.8°F | Resp 20 | Ht 61.0 in | Wt 158.0 lb

## 2022-03-24 DIAGNOSIS — R1900 Intra-abdominal and pelvic swelling, mass and lump, unspecified site: Secondary | ICD-10-CM

## 2022-03-24 DIAGNOSIS — I10 Essential (primary) hypertension: Secondary | ICD-10-CM

## 2022-03-24 DIAGNOSIS — I700 Atherosclerosis of aorta: Secondary | ICD-10-CM

## 2022-03-24 DIAGNOSIS — E559 Vitamin D deficiency, unspecified: Secondary | ICD-10-CM

## 2022-03-24 DIAGNOSIS — E782 Mixed hyperlipidemia: Secondary | ICD-10-CM | POA: Diagnosis not present

## 2022-03-24 DIAGNOSIS — K219 Gastro-esophageal reflux disease without esophagitis: Secondary | ICD-10-CM | POA: Diagnosis not present

## 2022-03-24 DIAGNOSIS — Z0001 Encounter for general adult medical examination with abnormal findings: Secondary | ICD-10-CM | POA: Diagnosis not present

## 2022-03-24 DIAGNOSIS — Z1211 Encounter for screening for malignant neoplasm of colon: Secondary | ICD-10-CM | POA: Diagnosis not present

## 2022-03-24 DIAGNOSIS — N1831 Chronic kidney disease, stage 3a: Secondary | ICD-10-CM

## 2022-03-24 DIAGNOSIS — E1165 Type 2 diabetes mellitus with hyperglycemia: Secondary | ICD-10-CM

## 2022-03-24 DIAGNOSIS — M810 Age-related osteoporosis without current pathological fracture: Secondary | ICD-10-CM

## 2022-03-24 DIAGNOSIS — Z23 Encounter for immunization: Secondary | ICD-10-CM | POA: Diagnosis not present

## 2022-03-24 DIAGNOSIS — Z0000 Encounter for general adult medical examination without abnormal findings: Secondary | ICD-10-CM

## 2022-03-24 LAB — CBC WITH DIFFERENTIAL/PLATELET
Basophils Absolute: 0.1 10*3/uL (ref 0.0–0.2)
Basos: 1 %
EOS (ABSOLUTE): 0.2 10*3/uL (ref 0.0–0.4)
Eos: 4 %
Hematocrit: 41.7 % (ref 34.0–46.6)
Hemoglobin: 14.3 g/dL (ref 11.1–15.9)
Immature Grans (Abs): 0 10*3/uL (ref 0.0–0.1)
Immature Granulocytes: 0 %
Lymphocytes Absolute: 1.9 10*3/uL (ref 0.7–3.1)
Lymphs: 33 %
MCH: 31.8 pg (ref 26.6–33.0)
MCHC: 34.3 g/dL (ref 31.5–35.7)
MCV: 93 fL (ref 79–97)
Monocytes Absolute: 0.5 10*3/uL (ref 0.1–0.9)
Monocytes: 9 %
Neutrophils Absolute: 3.1 10*3/uL (ref 1.4–7.0)
Neutrophils: 53 %
Platelets: 315 10*3/uL (ref 150–450)
RBC: 4.49 x10E6/uL (ref 3.77–5.28)
RDW: 12.5 % (ref 11.7–15.4)
WBC: 5.8 10*3/uL (ref 3.4–10.8)

## 2022-03-24 LAB — CMP14+EGFR
ALT: 12 IU/L (ref 0–32)
AST: 15 IU/L (ref 0–40)
Albumin/Globulin Ratio: 1.7 (ref 1.2–2.2)
Albumin: 4.3 g/dL (ref 3.8–4.8)
Alkaline Phosphatase: 76 IU/L (ref 44–121)
BUN/Creatinine Ratio: 13 (ref 12–28)
BUN: 15 mg/dL (ref 8–27)
Bilirubin Total: 0.5 mg/dL (ref 0.0–1.2)
CO2: 24 mmol/L (ref 20–29)
Calcium: 10.4 mg/dL — ABNORMAL HIGH (ref 8.7–10.3)
Chloride: 98 mmol/L (ref 96–106)
Creatinine, Ser: 1.17 mg/dL — ABNORMAL HIGH (ref 0.57–1.00)
Globulin, Total: 2.5 g/dL (ref 1.5–4.5)
Glucose: 138 mg/dL — ABNORMAL HIGH (ref 70–99)
Potassium: 4.3 mmol/L (ref 3.5–5.2)
Sodium: 138 mmol/L (ref 134–144)
Total Protein: 6.8 g/dL (ref 6.0–8.5)
eGFR: 50 mL/min/{1.73_m2} — ABNORMAL LOW (ref >59)

## 2022-03-24 LAB — LIPID PANEL
Chol/HDL Ratio: 3.2 ratio (ref 0.0–4.4)
Cholesterol, Total: 152 mg/dL (ref 100–199)
HDL: 48 mg/dL (ref >39)
LDL Chol Calc (NIH): 74 mg/dL (ref 0–99)
Triglycerides: 178 mg/dL — ABNORMAL HIGH (ref 0–149)
VLDL Cholesterol Cal: 30 mg/dL (ref 5–40)

## 2022-03-24 LAB — BAYER DCA HB A1C WAIVED: HB A1C (BAYER DCA - WAIVED): 6.6 % — ABNORMAL HIGH (ref 4.8–5.6)

## 2022-03-24 MED ORDER — DAPAGLIFLOZIN PROPANEDIOL 10 MG PO TABS: 10.0000 mg | ORAL_TABLET | Freq: Every day | ORAL | 3 refills | Status: DC

## 2022-03-24 NOTE — Progress Notes (Signed)
Assessment & Plan:  Well adult exam Discussed health benefits of physical activity, and encouraged her to engage in regular exercise appropriate for her age and condition. Preventive health education provided. Declined Shingrix and COVID booster.   Immunization History  Administered Date(s) Administered   Fluad Quad(high Dose 65+) 04/03/2021   Influenza, High Dose Seasonal PF 04/24/2017   Influenza,inj,Quad PF,6+ Mos 04/08/2016   Influenza-Unspecified 04/24/2017, 04/05/2018   PFIZER(Purple Top)SARS-COV-2 Vaccination 07/26/2019, 08/15/2019, 04/12/2020   Pneumococcal Conjugate-13 06/03/2016   Pneumococcal Polysaccharide-23 06/07/2017   Tdap 09/20/2014   Zoster, Live 03/25/2015   Health Maintenance  Topic Date Due   FOOT EXAM  01/23/2022   INFLUENZA VACCINE  02/03/2022   COLONOSCOPY (Pts 45-46yr Insurance coverage will need to be confirmed)  02/07/2022   HEMOGLOBIN A1C  03/14/2022   COVID-19 Vaccine (4 - Pfizer risk series) 04/09/2022 (Originally 06/07/2020)   Zoster Vaccines- Shingrix (1 of 2) 06/23/2022 (Originally 02/11/1970)   Diabetic kidney evaluation - Urine ACR  05/14/2022   Diabetic kidney evaluation - GFR measurement  09/12/2022   OPHTHALMOLOGY EXAM  09/12/2022   DEXA SCAN  01/30/2023   MAMMOGRAM  02/10/2024   TETANUS/TDAP  09/19/2024   Pneumonia Vaccine 71 Years old  Completed   Hepatitis C Screening  Completed   HPV VACCINES  Aged Out   - CBC with Differential/Platelet - CMP14+EGFR - Lipid panel  2. Stage 3a chronic kidney disease ([facility] Continue Farxiga daily. - CBC with Differential/Platelet - CMP14+EGFR - dapagliflozin propanediol (FARXIGA) 10 MG TABS tablet; Take 1 tablet (10 mg total) by mouth daily before breakfast.  Dispense: 90 tablet; Refill: 3  3. Type 2 diabetes mellitus with hyperglycemia, without long-term current use of insulin (HCC) A1c 6.6 today which has increased slightly from 6.3 six months ago. - Diabetes is at goal of A1c < 7. -  Medications: continue current medications - Home glucose monitoring: Continue monitoring - Patient is currently taking a statin. Patient is taking an ACE-inhibitor/ARB.  - Instruction/counseling given: discussed diet  Diabetes Health Maintenance Due  Topic Date Due   HEMOGLOBIN A1C  03/14/2022   OPHTHALMOLOGY EXAM  09/12/2022   FOOT EXAM  03/25/2023    Lab Results  Component Value Date   LABMICR 12.2 05/14/2021   MICROALBUR 4.4 07/20/2018   MICROALBUR 1.2 04/08/2017   - Bayer DCA Hb A1c Waived - CBC with Differential/Platelet - CMP14+EGFR - Lipid panel - dapagliflozin propanediol (FARXIGA) 10 MG TABS tablet; Take 1 tablet (10 mg total) by mouth daily before breakfast.  Dispense: 90 tablet; Refill: 3  4. Primary hypertension Well controlled on current regimen.  - CBC with Differential/Platelet - CMP14+EGFR - Lipid panel  5. Mixed hyperlipidemia Well controlled on current regimen.  - CBC with Differential/Platelet - CMP14+EGFR - Lipid panel  6. Aortic atherosclerosis (HCC) Continue current regimen. - CBC with Differential/Platelet - CMP14+EGFR - Lipid panel  7. Gastroesophageal reflux disease, unspecified whether esophagitis present Well controlled on current regimen.  - CMP14+EGFR  8. Age-related osteoporosis without current pathological fracture Continue calcium and vitamin D supplements.  Patient continues to decline any medication to treat osteoporosis.  9. Vitamin D deficiency Well controlled on current regimen.   10. Abdominal swelling - CMP14+EGFR - [place]Abdomen Limited RUQ (LIVER/GB); Future  11. Colon cancer screening - Ambulatory referral to Gastroenterology  12. Need for immunization against influenza - Flu Vaccine QUAD High Dose(Fluad)   Follow-up: Return in about 6 months (around 09/22/2022) for follow-up of chronic medication conditions.   Denise Meadows  Blanch Media, MSN, APRN, FNP-C [facility]  Subjective:  Patient ID: Denise Meadows, female    DOB: Nov 24, 1950  Age: 71 y.o. MRN: [id number]  Patient Care Team: Chevis Pretty, FNP as PCP - General (Family Medicine) Danie Binder, MD (Inactive) (Gastroenterology) Edythe Clarity, [facility] as Pharmacist (Pharmacist) Harlen Labs, MD as Referring Physician (Optometry) Blanca Friend Royce Macadamia, [facility] as Pharmacist (Family Medicine)   CC:  Chief Complaint  Patient presents with   Annual Exam    HPI Denise Meadows is a 71 y.o. female who presents today for a complete physical exam. She reports consuming a  healthy  diet. Home exercise routine includes walking. She generally feels well. She reports sleeping well. She does not have additional problems to discuss today.   Vision:Within last year Dental:No regular dental care  Advanced Directives Patient does have advanced directives including living will. She does not have a copy in the electronic medical record.   DEPRESSION SCREENING    03/24/2022    9:10 AM 02/16/2022    9:08 AM 01/14/2022    3:48 PM 09/11/2021    8:07 AM 05/14/2021    8:12 AM 04/03/2021    8:21 AM 01/23/2021    8:41 AM  PHQ 2/9 Scores  PHQ - 2 Score 0 0 0 0 0 0 0  PHQ- 9 Score  1 0 0 0 0 0    Diabetes: Patient presents for follow up of diabetes. Current symptoms include: hyperglycemia. Known diabetic complications: none. Medication compliance: yes. Current diet: in general, a "healthy" diet  . Current exercise: walking. Home blood sugar records: BGs consistently in an acceptable range. Is she  on ACE inhibitor or angiotensin II receptor blocker? Yes. Is she on a statin? Yes.   Hypertension: She does not take her BP often at home, but when she does, she usually gets readings around 120/60. She is taking ramipril and metoprolol. She had an appointment with her cardiologist in May 2023.   CKD: She is at stage 3a and taking [place] daily which she gets with prescription assistance.    Osteoporosis: Noted on her last DEXA scan in July. Currently  taking calcium and vitamin D. She is not agreeable to initiating Fosamax at this time.    Atherosclerosis: taking fish oil and simvastatin. She is not on a daily aspirin due to previous peptic ulcer.    GERD: taking omeprazole daily as needed which is helpful.   Vitamin D Insufficiency: taking a vitamin D supplement. Her last vitamin D level was normal in March 2023.   Panic attacks: Patient has a history of having panic attacks.  This is the only time she takes a Valium.  She has not had to take one in the past three years. She believes her prescription may now be expired.   Review of Systems  Constitutional:  Negative for chills, fever, malaise/fatigue and weight loss.  HENT:  Negative for congestion, ear discharge, ear pain, nosebleeds, sinus pain, sore throat and tinnitus.   Eyes:  Negative for blurred vision, double vision, pain, discharge and redness.  Respiratory:  Negative for cough, shortness of breath and wheezing.   Cardiovascular:  Negative for chest pain, palpitations and leg swelling.  Gastrointestinal:  Negative for abdominal pain, constipation, diarrhea, heartburn, nausea and vomiting.  Genitourinary:  Negative for dysuria, frequency and urgency.  Musculoskeletal:  Positive for back pain and joint pain. Negative for myalgias.  Skin:  Negative for rash.  Neurological:  Negative for dizziness, seizures, weakness and headaches.  Psychiatric/Behavioral:  Negative for depression, substance abuse and suicidal ideas. The patient is not nervous/anxious.      Current Outpatient Medications:    calcium gluconate 500 MG tablet, Take 1 tablet (500 mg total) 2 (two) times daily by mouth., Disp: 60 tablet, Rfl: 11   Cholecalciferol (VITAMIN D) 2000 units tablet, Take 1 tablet (2,000 Units total) by mouth daily., Disp: 90 tablet, Rfl: 2   dapagliflozin propanediol (FARXIGA) 10 MG TABS tablet, Take 1 tablet (10 mg total) by mouth daily before breakfast., Disp: 90 tablet, Rfl: 6    diazepam (VALIUM) 2 MG tablet, Take 1 tablet (2 mg total) by mouth every 6 (six) hours as needed for anxiety., Disp: 20 tablet, Rfl: 0   glucose blood test strip, Please dispense as OneTouch Ultra 2. Use as directed to monitor FSBS 1x weekly. Dx: E11.9., Disp: 100 each, Rfl: 3   Lancets (ONETOUCH ULTRASOFT) lancets, Use as directed to monitor FSBS 1x weekly. Dx: E11.9., Disp: 100 each, Rfl: 3   metFORMIN (GLUCOPHAGE) 1000 MG tablet, TAKE 1 TABLET BY MOUTH  DAILY WITH BREAKFAST, Disp: 90 tablet, Rfl: 3   methocarbamol (ROBAXIN) 500 MG tablet, Take 1 tablet (500 mg total) by mouth every 8 (eight) hours as needed for muscle spasms., Disp: 60 tablet, Rfl: 0   metoprolol succinate (TOPROL-XL) 25 MG 24 hr tablet, TAKE 1 TABLET BY MOUTH ONCE  DAILY, Disp: 90 tablet, Rfl: 3   metroNIDAZOLE (METROGEL) 0.75 % gel, Apply 1 application topically as needed., Disp: , Rfl:    Multiple Vitamin (MULTIVITAMIN WITH MINERALS) TABS tablet, Take 1 tablet by mouth daily., Disp: , Rfl:    Omega-3 Fatty Acids (FISH OIL) 1000 MG CAPS, Take 1 capsule (1,000 mg total) by mouth 2 (two) times daily., Disp: , Rfl: 0   omeprazole (PRILOSEC) 20 MG capsule, Take 1 capsule (20 mg total) by mouth daily as needed., Disp: 90 capsule, Rfl: 1   ramipril (ALTACE) 10 MG capsule, TAKE 1 CAPSULE BY MOUTH DAILY, Disp: 90 capsule, Rfl: 3   simvastatin (ZOCOR) 20 MG tablet, TAKE 1 TABLET BY MOUTH AT  BEDTIME, Disp: 90 tablet, Rfl: 3   vitamin B-12 (CYANOCOBALAMIN) 100 MCG tablet, Take 1 tablet (100 mcg total) by mouth daily., Disp: , Rfl:   Allergies  Allergen Reactions   Acetaminophen Nausea And Vomiting   Codeine Nausea And Vomiting   Penicillins Rash    Past Medical History:  Diagnosis Date   Aortic atherosclerosis (HCC)    Complication of anesthesia    DDD (degenerative disc disease)    Diabetes mellitus    Diastolic dysfunction    Elevated liver function tests    eval by dr Leonides Grills to fatty liver 2008   GERD  (gastroesophageal reflux disease)    Hiatal hernia    Hyperlipidemia    Hypertension    NASH (nonalcoholic steatohepatitis)    Nephrolithiasis    Osteoporosis 01/29/2021   PONV (postoperative nausea and vomiting)    heel surgery   Pulmonary nodule    no change-1995   Rosacea    Ulcer    gastric ulcer, antritis-egd 09/06/08   Vitamin D deficiency     Past Surgical History:  Procedure Laterality Date   BREAST CYST ASPIRATION Left 2005   BREAST SURGERY     benign breast nodules 2005   CHOLECYSTECTOMY     COLONOSCOPY  2008   Dr Medoff-Normal   DILATION AND CURETTAGE, DIAGNOSTIC / THERAPEUTIC  ESOPHAGOGASTRODUODENOSCOPY     FRACTURE SURGERY     MALONEY DILATION  02/08/2012   Procedure: MALONEY DILATION;  Surgeon: Danie Binder, MD;  [facility]: [facility];  Service: Endoscopy;  Laterality: N/A;   SAVORY DILATION  02/08/2012   Procedure: SAVORY DILATION;  Surgeon: Danie Binder, MD;  Location: [facility];  Service: Endoscopy;  Laterality: N/A;   SPINE SURGERY      Family History  Problem Relation Age of Onset   Hyperlipidemia Mother    Osteoarthritis Mother    COPD Father    Colon cancer Maternal Aunt    Colon cancer Maternal Grandfather    Scoliosis Son    Heart disease Neg Hx    Breast cancer Neg Hx     Social History   Socioeconomic History   Marital status: Married    Spouse name: Not on file   Number of children: 2   Years of education: Not on file   Highest education level: Not on file  Occupational History   Occupation: homemaker  Tobacco Use   Smoking status: Former    Types: Cigarettes    Quit date: 07/06/1988    Years since quitting: 33.7   Smokeless tobacco: Never   Tobacco comments:    intermittent for a few months  Vaping Use   Vaping Use: Never used  Substance and Sexual Activity   Alcohol use: Not Currently    Comment: occasional mixed drink couple per mo   Drug use: No   Sexual activity: Not on file  Other Topics Concern   Not on  file  Social History Narrative   Lives w/ husband   Social Determinants of Health   Financial Resource Strain: Low Risk  (02/16/2022)   Overall Financial Resource Strain (CARDIA)    Difficulty of Paying Living Expenses: Not hard at all  Food Insecurity: No Food Insecurity (02/16/2022)   Hunger Vital Sign    Worried About Running Out of Food in the Last Year: Never true    [place] in the Last Year: Never true  Transportation Needs: No Transportation Needs (02/16/2022)   PRAPARE - Hydrologist (Medical): No    Lack of Transportation (Non-Medical): No  Physical Activity: Sufficiently Active (02/16/2022)   Exercise Vital Sign    Days of Exercise per Week: 7 days    Minutes of Exercise per Session: 30 min  Stress: No Stress Concern Present (02/16/2022)   [place]    Feeling of Stress : Only a little  Social Connections: Socially Integrated (02/16/2022)   Social Connection and Isolation Panel [NHANES]    Frequency of Communication with Friends and Family: More than three times a week    Frequency of Social Gatherings with Friends and Family: More than three times a week    Attends Religious Services: More than 4 times per year    Active Member of [employer] or Organizations: Yes    Attends Music therapist: More than 4 times per year    Marital Status: Married  Human resources officer Violence: Not At Risk (02/16/2022)   Humiliation, Afraid, Rape, and Kick questionnaire    Fear of Current or Ex-Partner: No    Emotionally Abused: No    Physically Abused: No    Sexually Abused: No      Objective:    BP 105/61   Pulse 72   Temp 97.8 F (36.6 C)  Resp 20   Ht '5\' 1"'  (1.549 m)   Wt 158 lb (71.7 kg)   SpO2 98%   BMI 29.85 kg/m   BP Readings from Last 3 Encounters:  03/24/22 105/61  01/14/22 118/72  11/12/21 138/82   Wt Readings from Last 3 Encounters:  03/24/22 158 lb  (71.7 kg)  02/16/22 163 lb (73.9 kg)  01/14/22 163 lb 6.4 oz (74.1 kg)    Physical Exam Vitals reviewed.  Constitutional:      General: She is not in acute distress.    Appearance: Normal appearance. She is overweight. She is not ill-appearing, toxic-appearing or diaphoretic.  HENT:     Head: Normocephalic and atraumatic.     Right Ear: Tympanic membrane, ear canal and external ear normal. There is no impacted cerumen.     Left Ear: Tympanic membrane, ear canal and external ear normal. There is no impacted cerumen.     Nose: Nose normal. No congestion or rhinorrhea.     Mouth/Throat:     Mouth: Mucous membranes are moist.     Pharynx: Oropharynx is clear. No oropharyngeal exudate or posterior oropharyngeal erythema.  Eyes:     General: No scleral icterus.       Right eye: No discharge.        Left eye: No discharge.     Conjunctiva/sclera: Conjunctivae normal.     Pupils: Pupils are equal, round, and reactive to light.  Cardiovascular:     Rate and Rhythm: Normal rate and regular rhythm.     Heart sounds: Normal heart sounds. No murmur heard.    No friction rub. No gallop.  Pulmonary:     Effort: Pulmonary effort is normal. No respiratory distress.     Breath sounds: Normal breath sounds. No stridor. No wheezing, rhonchi or rales.  Abdominal:     General: Abdomen is flat. Bowel sounds are normal. There is no distension.     Palpations: Abdomen is soft. There is no hepatomegaly, splenomegaly or mass.     Tenderness: There is no abdominal tenderness. There is no guarding or rebound.     Hernia: No hernia is present.     Comments: Swelling at RUQ.  Musculoskeletal:        General: Normal range of motion.     Cervical back: Normal range of motion and neck supple. No rigidity. No muscular tenderness.  Lymphadenopathy:     Cervical: No cervical adenopathy.  Skin:    General: Skin is warm and dry.     Capillary Refill: Capillary refill takes less than 2 seconds.  Neurological:      General: No focal deficit present.     Mental Status: She is alert and oriented to person, place, and time. Mental status is at baseline.  Psychiatric:        Mood and Affect: Mood normal.        Behavior: Behavior normal.        Thought Content: Thought content normal.        Judgment: Judgment normal.    Diabetic Foot Exam - Simple   Simple Foot Form Diabetic Foot exam was performed with the following findings: Yes 03/24/2022  9:48 AM  Visual Inspection No deformities, no ulcerations, no other skin breakdown bilaterally: Yes Sensation Testing Intact to touch and monofilament testing bilaterally: Yes Pulse Check Posterior Tibialis and Dorsalis pulse intact bilaterally: Yes Comments     Lab Results  Component Value Date   TSH 2.69 07/20/2018  Lab Results  Component Value Date   WBC 6.4 09/11/2021   HGB 13.1 09/11/2021   HCT 39.6 09/11/2021   MCV 92 09/11/2021   PLT 284 09/11/2021   Lab Results  Component Value Date   NA 137 09/11/2021   K 5.0 09/11/2021   CO2 22 09/11/2021   GLUCOSE 141 (H) 09/11/2021   BUN 17 09/11/2021   CREATININE 1.19 (H) 09/11/2021   BILITOT 0.5 09/11/2021   ALKPHOS 100 09/11/2021   AST 15 09/11/2021   ALT 13 09/11/2021   PROT 6.5 09/11/2021   ALBUMIN 4.3 09/11/2021   CALCIUM 9.9 09/11/2021   EGFR 49 (L) 09/11/2021   Lab Results  Component Value Date   CHOL 155 09/11/2021   Lab Results  Component Value Date   HDL 51 09/11/2021   Lab Results  Component Value Date   LDLCALC 74 09/11/2021   Lab Results  Component Value Date   TRIG 176 (H) 09/11/2021   Lab Results  Component Value Date   CHOLHDL 3.0 09/11/2021   Lab Results  Component Value Date   HGBA1C 6.3 (H) 09/11/2021

## 2022-03-26 ENCOUNTER — Other Ambulatory Visit: Payer: Self-pay | Admitting: Family Medicine

## 2022-03-26 DIAGNOSIS — K219 Gastro-esophageal reflux disease without esophagitis: Secondary | ICD-10-CM

## 2022-03-31 ENCOUNTER — Telehealth: Payer: Self-pay | Admitting: Internal Medicine

## 2022-03-31 NOTE — Telephone Encounter (Signed)
Pt asked if we could send her records to [facility] on [address].

## 2022-03-31 NOTE — Telephone Encounter (Signed)
done

## 2022-04-03 ENCOUNTER — Ambulatory Visit (HOSPITAL_COMMUNITY)
Admission: RE | Admit: 2022-04-03 | Discharge: 2022-04-03 | Disposition: A | Discharge status: Home / Self Care | Payer: Medicare Other | Source: Ambulatory Visit | Attending: Family Medicine | Admitting: Family Medicine

## 2022-04-03 DIAGNOSIS — Z9049 Acquired absence of other specified parts of digestive tract: Secondary | ICD-10-CM | POA: Diagnosis not present

## 2022-04-03 DIAGNOSIS — R1900 Intra-abdominal and pelvic swelling, mass and lump, unspecified site: Secondary | ICD-10-CM | POA: Insufficient documentation

## 2022-04-06 ENCOUNTER — Telehealth: Payer: Self-pay | Admitting: Nurse Practitioner

## 2022-04-06 NOTE — Telephone Encounter (Signed)
Pt informed that the u/s has not been reviewed by Shelah Lewandowsky at this time. Advised that we will call when reviewed.

## 2022-04-06 NOTE — Telephone Encounter (Signed)
Please call patient back with [place] results.

## 2022-04-13 ENCOUNTER — Other Ambulatory Visit: Payer: Self-pay | Admitting: Nurse Practitioner

## 2022-04-13 NOTE — Telephone Encounter (Signed)
Pt returned missed call. Reviewed providers [place] result notes with pt and pt voiced understanding. Wants to know what next steps are because she says there is a lump below her breast line (in the area that galbladder and liver would be in) and she is very concerned about it.   Please advise and call patient back.

## 2022-04-13 NOTE — Telephone Encounter (Signed)
Wants [place] results done 9-29.

## 2022-04-13 NOTE — Telephone Encounter (Signed)
I'll have PCP discussed with patient what his next step will be. thanks

## 2022-04-13 NOTE — Telephone Encounter (Signed)
No acute process per [place] results below  No focal lesion identified. Within normal limits in parenchymal echogenicity. Portal vein is patent on color. Doppler imaging with normal direction of blood flow towards the liver.

## 2022-04-13 NOTE — Telephone Encounter (Signed)
Lmtcb.

## 2022-04-14 NOTE — Telephone Encounter (Signed)
I will need to see patient yo deeterminethe next step since she was originally seen by B. Jovce. The [place] of right upper quadrant did not show anything. I need t see exactly where are of concern is to determine next step.

## 2022-04-14 NOTE — Telephone Encounter (Signed)
Patient aware - appt made 

## 2022-04-20 ENCOUNTER — Encounter: Payer: Self-pay | Admitting: Nurse Practitioner

## 2022-04-20 ENCOUNTER — Ambulatory Visit (INDEPENDENT_AMBULATORY_CARE_PROVIDER_SITE_OTHER): Payer: Medicare Other | Admitting: Nurse Practitioner

## 2022-04-20 VITALS — BP 118/74 | HR 61 | Temp 97.2°F | Resp 20 | Ht 61.0 in | Wt 159.0 lb

## 2022-04-20 DIAGNOSIS — D171 Benign lipomatous neoplasm of skin and subcutaneous tissue of trunk: Secondary | ICD-10-CM

## 2022-04-20 NOTE — Progress Notes (Signed)
   Subjective:    Patient ID: Denise Meadows, female    DOB: 06-Sep-1950, 71 y.o.   MRN: [id number]   Chief Complaint: Follow up from ultrasound   HPI Patient was seen by B. Joyce,NP on 03/24/22. She had a small mass and ultra sound was ordered. Ultra sound was negative, did not show anything. She is here today because I needed to see exactly where mass is located to decide what to order. The mass area is alog RUQ and is nontender.    Review of Systems  Constitutional:  Negative for diaphoresis.  Eyes:  Negative for pain.  Respiratory:  Negative for shortness of breath.   Cardiovascular:  Negative for chest pain, palpitations and leg swelling.  Gastrointestinal:  Negative for abdominal pain.  Endocrine: Negative for polydipsia.  Skin:  Negative for rash.  Neurological:  Negative for dizziness, weakness and headaches.  Hematological:  Does not bruise/bleed easily.  All other systems reviewed and are negative.      Objective:   Physical Exam Constitutional:      Appearance: Normal appearance.  Cardiovascular:     Rate and Rhythm: Normal rate and regular rhythm.     Heart sounds: Normal heart sounds.  Pulmonary:     Effort: Pulmonary effort is normal.     Breath sounds: Normal breath sounds.  Abdominal:     Palpations: There is mass (soft nontender mass right upper quadrant).  Skin:    General: Skin is warm.  Neurological:     General: No focal deficit present.     Mental Status: She is alert and oriented to person, place, and time.  Psychiatric:        Mood and Affect: Mood normal.        Behavior: Behavior normal.    BP 118/74   Pulse 61   Temp (!) 97.2 F (36.2 C) (Temporal)   Resp 20   Ht '5\' 1"'$  (1.549 m)   Wt 159 lb (72.1 kg)   SpO2 98%   BMI 30.04 kg/m        Assessment & Plan:  Denise Meadows in today with chief complaint of Follow up from ultrasound   1. Lipoma of torso Watch for changes Offered CT scan but patient said she would just watch area.      The above assessment and management plan was discussed with the patient. The patient verbalized understanding of and has agreed to the management plan. Patient is aware to call the clinic if symptoms persist or worsen. Patient is aware when to return to the clinic for a follow-up visit. Patient educated on when it is appropriate to go to the emergency department.   [name], FNP

## 2022-04-20 NOTE — Patient Instructions (Signed)
Lipoma  A lipoma is a noncancerous (benign) tumor that is made up of fat cells. This is a very common type of soft-tissue growth. Lipomas are usually found under the skin (subcutaneous). They may occur in any tissue of the body that contains fat. Common areas for lipomas to appear include the back, arms, shoulders, buttocks, and thighs. Lipomas grow slowly, and they are usually painless. Most lipomas do not cause problems and do not require treatment. What are the causes? The cause of this condition is not known. What increases the risk? You are more likely to develop this condition if: You are 40-60 years old. You have a family history of lipomas. What are the signs or symptoms? A lipoma usually appears as a small, round bump under the skin. In most cases, the lump will: Feel soft or rubbery. Not cause pain or other symptoms. However, if a lipoma is located in an area where it pushes on nerves, it can become painful or cause other symptoms. How is this diagnosed? A lipoma can usually be diagnosed with a physical exam. You may also have tests to confirm the diagnosis and to rule out other conditions. Tests may include: Imaging tests, such as a CT scan or an MRI. Removal of a tissue sample to be looked at under a microscope (biopsy). How is this treated? Treatment for this condition depends on the size of the lipoma and whether it is causing any symptoms. For small lipomas that are not causing problems, no treatment is needed. If a lipoma is bigger or it causes problems, surgery may be done to remove the lipoma. Lipomas can also be removed to improve appearance. Most often, the procedure is done after applying a medicine that numbs the area (local anesthetic). Liposuction may be done to reduce the size of the lipoma before it is removed through surgery, or it may be done to remove the lipoma. Lipomas are removed with this method to limit incision size and scarring. A liposuction tube is  inserted through a small incision into the lipoma, and the contents of the lipoma are removed through the tube with suction. Follow these instructions at home: Watch your lipoma for any changes. Keep all follow-up visits. This is important. Where to find more information OrthoInfo: orthoinfo.aaos.org Contact a health care provider if: Your lipoma becomes larger or hard. Your lipoma becomes painful, red, or increasingly swollen. These could be signs of infection or a more serious condition. Get help right away if: You develop tingling or numbness in an area near the lipoma. This could indicate that the lipoma is causing nerve damage. Summary A lipoma is a noncancerous tumor that is made up of fat cells. Most lipomas do not cause problems and do not require treatment. If a lipoma is bigger or it causes problems, surgery may be done to remove the lipoma. Contact a health care provider if your lipoma becomes larger or hard, or if it becomes painful, red, or increasingly swollen. These could be signs of infection or a more serious condition. This information is not intended to replace advice given to you by your health care provider. Make sure you discuss any questions you have with your health care provider. Document Revised: 07/11/2021 Document Reviewed: 07/11/2021 Elsevier Patient Education  2023 [employer]  

## 2022-04-28 ENCOUNTER — Telehealth: Payer: Medicare Other

## 2022-04-28 ENCOUNTER — Telehealth: Payer: Self-pay | Admitting: Pharmacist

## 2022-04-28 DIAGNOSIS — E1165 Type 2 diabetes mellitus with hyperglycemia: Secondary | ICD-10-CM

## 2022-04-28 DIAGNOSIS — N1831 Chronic kidney disease, stage 3a: Secondary | ICD-10-CM

## 2022-04-28 MED ORDER — DAPAGLIFLOZIN PROPANEDIOL 10 MG PO TABS: 10.0000 mg | ORAL_TABLET | Freq: Every day | ORAL | 3 refills | Status: DC

## 2022-04-28 NOTE — Telephone Encounter (Signed)
Referral made to nephrology

## 2022-04-28 NOTE — Telephone Encounter (Signed)
Patient would like to see a nephrologist re: CKD She has met her deductible for the year and would like to get in before the end of the year Requests [place] ([facility] or [facility]hut[facility]) first available Stable on ACEI/SGLT2 GFR increased by 1 pt since we started farxiga  Thank you! 

## 2022-04-29 ENCOUNTER — Telehealth: Payer: Self-pay

## 2022-04-29 NOTE — Chronic Care Management (AMB) (Signed)
  Chronic Care Management Note  04/29/2022 Name: Denise Meadows MRN: [id number] DOB: 1951/05/04  Denise Meadows is a 71 y.o. year old female who is a primary care patient of Chevis Pretty, [place] and is actively engaged with the care management team. I reached out to [employer] by phone today to assist with re-scheduling a follow up visit with the Pharmacist  Follow up plan: Unsuccessful telephone outreach attempt made. A HIPAA compliant phone message was left for the patient providing contact information and requesting a return call.  The care management team will reach out to the patient again over the next 7 days.  If patient returns call to provider office, please advise to call [facility]  at [address], [place], [place] [address] Direct Dial: [phone] [email or web address]

## 2022-04-30 ENCOUNTER — Telehealth: Payer: Self-pay | Admitting: Pharmacist

## 2022-04-30 DIAGNOSIS — E1165 Type 2 diabetes mellitus with hyperglycemia: Secondary | ICD-10-CM

## 2022-04-30 DIAGNOSIS — N1831 Chronic kidney disease, stage 3a: Secondary | ICD-10-CM

## 2022-04-30 NOTE — Telephone Encounter (Signed)
New ref put in for CCM DX: CKD, T2DM, HTN, HLD

## 2022-05-11 NOTE — Progress Notes (Signed)
  Chronic Care Management   Note  05/11/2022 Name: [name] MRN: [id number] DOB: 07/02/1951  Denise Meadows is a 71 y.o. year old female who is a primary care patient of Chevis Pretty, [place]. I reached out to [employer] by phone today in response to a referral sent by [name]'s PCP.  Ms. ROSEA DORY  agreedto scheduling an appointment with the CCM RN Case Manager   Follow up plan: Patient agreed to scheduled appointment with RN Case Manager on 05/18/2022(date/time).   Noreene Larsson, [place], [place] [address] Direct Dial: [phone] [email or web address]

## 2022-05-18 ENCOUNTER — Ambulatory Visit (INDEPENDENT_AMBULATORY_CARE_PROVIDER_SITE_OTHER): Payer: Medicare Other | Admitting: *Deleted

## 2022-05-18 DIAGNOSIS — I10 Essential (primary) hypertension: Secondary | ICD-10-CM

## 2022-05-18 DIAGNOSIS — E1165 Type 2 diabetes mellitus with hyperglycemia: Secondary | ICD-10-CM

## 2022-05-18 DIAGNOSIS — N1831 Chronic kidney disease, stage 3a: Secondary | ICD-10-CM

## 2022-05-18 NOTE — Patient Instructions (Signed)
Please call the care guide team at [phone] if you need to cancel or reschedule your appointment.   If you are experiencing a Mental Health or [place] or need someone to talk to, please call the Suicide and Crisis Lifeline: 988 call the [place] National Suicide Prevention Lifeline: (512) 214-8350 or TTY: 639-011-8324 TTY (332)556-1866) to talk to a trained counselor call 1-800-273-TALK (toll free, 24 hour hotline) go to [facility] [phone]) call the [place]: [phone] call 911   Following is a copy of your full provider care plan:   Goals Addressed             This Visit's Progress    CCM ([place]) EXPECTED OUTCOME: MONITOR, SELF-MANAGE AND REDUCE SYMPTOMS OF CHRONIC KIDNEY DISEASE       Current Barriers:  Knowledge Deficits related to Chronic Kidney Disease management Chronic Disease Management support and education needs related to Chronic Kidney Disease  Planned Interventions: Evaluation of current treatment plan related to chronic kidney disease self management and patient's adherence to plan as established by provider      Reviewed prescribed diet low sodium Reviewed medications with patient and discussed importance of compliance    Counseled on the importance of exercise goals with target of 150 minutes per week     Advised patient, providing education and rationale, to monitor blood pressure daily and record, calling PCP for findings outside established parameters    Discussed plans with patient for ongoing care management follow up and provided patient with direct contact information for care management team    Screening for signs and symptoms of depression related to chronic disease state      Assessed social determinant of health barriers    Reviewed importance of drinking adequate fluids Reviewed importance of overall healthy lifestyle,  nutrition  Symptom Management: Take medications as prescribed   Attend all scheduled provider appointments Call pharmacy for medication refills 3-7 days in advance of running out of medications Attend church or other social activities Perform all self care activities independently  Perform IADL's (shopping, preparing meals, housekeeping, managing finances) independently Call provider office for new concerns or questions  Drink adequate fluids, preferably water Continue exercising  Follow Up Plan: Telephone follow up appointment with care management team member scheduled for:  07/03/22 at 9 am       CCM (DIABETES) EXPECTED OUTCOME:  MONITOR, SELF-MANAGE AND REDUCE SYMPTOMS OF DIABETES       Current Barriers:  Knowledge Deficits related to Diabetes management Chronic Disease Management support and education needs related to Diabetes and diet No Advanced Directives in place- has living will, requests documents for HCPOA Patient reports she checks CBG once per week due to "since [facility] is so good, agreed upon I can check CBG once per week"  pt reports readings are usually 99-120, pt reports she sometimes follows carbohydrate modified diet, pt does exercise  Planned Interventions: Provided education to patient about basic DM disease process; Reviewed medications with patient and discussed importance of medication adherence;        Reviewed prescribed diet with patient carbohydrate modified, gave examples of foods high in carbohydrates; Discussed plans with patient for ongoing care management follow up and provided patient with direct contact information for care management team;      Provided patient with written educational materials related to hypo and hyperglycemia and importance of correct treatment;       Review of patient  status, including review of consultants reports, relevant laboratory and other test results, and medications completed;       Screening for signs and symptoms of  depression related to chronic disease state;        Assessed social determinant of health barriers;        Reviewed importance of exercise  Symptom Management: Take medications as prescribed   Attend all scheduled provider appointments Call pharmacy for medication refills 3-7 days in advance of running out of medications Attend church or other social activities Perform all self care activities independently  Perform IADL's (shopping, preparing meals, housekeeping, managing finances) independently Call provider office for new concerns or questions  check blood sugar at prescribed times: per pt report checks once weekly since [facility] is good  check feet daily for cuts, sores or redness enter blood sugar readings and medication or insulin into daily log take the blood sugar log to all doctor visits take the blood sugar meter to all doctor visits trim toenails straight across fill half of plate with vegetables read food labels for fat, fiber, carbohydrates and portion size Look over education via My Chart- hypoglycemia  Follow Up Plan: Telephone follow up appointment with care management team member scheduled for: 07/03/22 at 9 am       CCM (HYPERTENSION) EXPECTED OUTCOME: MONITOR, SELF-MANAGE AND REDUCE SYMPTOMS OF HYPERTENSION       Current Barriers:  Knowledge Deficits related to Hypertension management Chronic Disease Management support and education needs related to Hypertension and diet No Advanced Directives in place- pt reports she has Living will, would like documents mailed for HCPOA Patient reports she lives with spouse, is independent in all aspects of her care, has all medication and taking as prescribed, checks blood pressure 2-3 x per week and states " readings are are always good", pt reports she walks 3-5 times per week, tries to eat healthy as much as possible and adheres to low sodium diet  Planned Interventions: Evaluation of current treatment plan related to hypertension  self management and patient's adherence to plan as established by provider;   Reviewed prescribed diet low sodium, food choices Reviewed medications with patient and discussed importance of compliance;  Counseled on the importance of exercise goals with target of 150 minutes per week Discussed plans with patient for ongoing care management follow up and provided patient with direct contact information for care management team; Advised patient, providing education and rationale, to monitor blood pressure daily and record, calling PCP for findings outside established parameters;  Provided education on prescribed diet low sodium;  Discussed complications of poorly controlled blood pressure such as heart disease, stroke, circulatory complications, vision complications, kidney impairment, sexual dysfunction;  Screening for signs and symptoms of depression related to chronic disease state;  Assessed social determinant of health barriers;   Symptom Management: Take medications as prescribed   Attend all scheduled provider appointments Call pharmacy for medication refills 3-7 days in advance of running out of medications Attend church or other social activities Perform all self care activities independently  Perform IADL's (shopping, preparing meals, housekeeping, managing finances) independently Call provider office for new concerns or questions  check blood pressure 3 times per week write blood pressure results in a log or diary take blood pressure log to all doctor appointments call doctor for signs and symptoms of high blood pressure keep all doctor appointments take medications for blood pressure exactly as prescribed eat more whole grains, fruits and vegetables, lean meats and healthy fats  Look over education via My Chart- low sodium diet Continue walking 3-5 times per week, keep up the good work!  Follow Up Plan: Telephone follow up appointment with care management team member scheduled  for:  07/03/22 at 9 am          Patient verbalizes understanding of instructions and care plan provided today and agrees to view in [place]. Active MyChart status and patient understanding of how to access instructions and care plan via MyChart confirmed with patient.     Telephone follow up appointment with care management team member scheduled for: 07/03/22 at 9 am  Low-Sodium Eating Plan Sodium, which is an element that makes up salt, helps you maintain a healthy balance of fluids in your body. Too much sodium can increase your blood pressure and cause fluid and waste to be held in your body. Your health care provider or dietitian may recommend following this plan if you have high blood pressure (hypertension), kidney disease, liver disease, or heart failure. Eating less sodium can help lower your blood pressure, reduce swelling, and protect your heart, liver, and kidneys. What are tips for following this plan? Reading food labels The Nutrition Facts label lists the amount of sodium in one serving of the food. If you eat more than one serving, you must multiply the listed amount of sodium by the number of servings. Choose foods with less than 140 mg of sodium per serving. Avoid foods with 300 mg of sodium or more per serving. Shopping  Look for lower-sodium products, often labeled as "low-sodium" or "no salt added." Always check the sodium content, even if foods are labeled as "unsalted" or "no salt added." Buy fresh foods. Avoid canned foods and pre-made or frozen meals. Avoid canned, cured, or processed meats. Buy breads that have less than 80 mg of sodium per slice. Cooking  Eat more home-cooked food and less restaurant, buffet, and fast food. Avoid adding salt when cooking. Use salt-free seasonings or herbs instead of table salt or sea salt. Check with your health care provider or pharmacist before using salt substitutes. Cook with plant-based oils, such as canola, sunflower, or  olive oil. Meal planning When eating at a restaurant, ask that your food be prepared with less salt or no salt, if possible. Avoid dishes labeled as brined, pickled, cured, smoked, or made with soy sauce, miso, or teriyaki sauce. Avoid foods that contain MSG (monosodium glutamate). MSG is sometimes added to [place] food, bouillon, and some canned foods. Make meals that can be grilled, baked, poached, roasted, or steamed. These are generally made with less sodium. General information Most people on this plan should limit their sodium intake to 1,500-2,000 mg (milligrams) of sodium each day. What foods should I eat? Fruits Fresh, frozen, or canned fruit. Fruit juice. Vegetables Fresh or frozen vegetables. "No salt added" canned vegetables. "No salt added" tomato sauce and paste. Low-sodium or reduced-sodium tomato and vegetable juice. Grains Low-sodium cereals, including oats, puffed wheat and rice, and shredded wheat. Low-sodium crackers. Unsalted rice. Unsalted pasta. Low-sodium bread. Whole-grain breads and whole-grain pasta. Meats and other proteins Fresh or frozen (no salt added) meat, poultry, seafood, and fish. Low-sodium canned tuna and salmon. Unsalted nuts. Dried peas, beans, and lentils without added salt. Unsalted canned beans. Eggs. Unsalted nut butters. Dairy Milk. Soy milk. Cheese that is naturally low in sodium, such as ricotta cheese, fresh mozzarella, or Swiss cheese. Low-sodium or reduced-sodium cheese. Cream cheese. Yogurt. Seasonings and condiments Fresh and dried herbs and spices.  Salt-free seasonings. Low-sodium mustard and ketchup. Sodium-free salad dressing. Sodium-free light mayonnaise. Fresh or refrigerated horseradish. Lemon juice. Vinegar. Other foods Homemade, reduced-sodium, or low-sodium soups. Unsalted popcorn and pretzels. Low-salt or salt-free chips. The items listed above may not be a complete list of foods and beverages you can eat. Contact a dietitian for  more information. What foods should I avoid? Vegetables Sauerkraut, pickled vegetables, and relishes. Olives. [place] fries. Onion rings. Regular canned vegetables (not low-sodium or reduced-sodium). Regular canned tomato sauce and paste (not low-sodium or reduced-sodium). Regular tomato and vegetable juice (not low-sodium or reduced-sodium). Frozen vegetables in sauces. Grains Instant hot cereals. Bread stuffing, pancake, and biscuit mixes. Croutons. Seasoned rice or pasta mixes. Noodle soup cups. Boxed or frozen macaroni and cheese. Regular salted crackers. Self-rising flour. Meats and other proteins Meat or fish that is salted, canned, smoked, spiced, or pickled. Precooked or cured meat, such as sausages or meat loaves. [place] Salines. Ham. Pepperoni. Hot dogs. Corned beef. Chipped beef. Salt pork. Jerky. Pickled herring. Anchovies and sardines. Regular canned tuna. Salted nuts. Dairy Processed cheese and cheese spreads. Hard cheeses. Cheese curds. Blue cheese. Feta cheese. String cheese. Regular cottage cheese. Buttermilk. Canned milk. Fats and oils Salted butter. Regular margarine. Ghee. Bacon fat. Seasonings and condiments Onion salt, garlic salt, seasoned salt, table salt, and sea salt. Canned and packaged gravies. Worcestershire sauce. Tartar sauce. Barbecue sauce. Teriyaki sauce. Soy sauce, including reduced-sodium. Steak sauce. Fish sauce. Oyster sauce. Cocktail sauce. Horseradish that you find on the shelf. Regular ketchup and mustard. Meat flavorings and tenderizers. Bouillon cubes. Hot sauce. Pre-made or packaged marinades. Pre-made or packaged taco seasonings. Relishes. Regular salad dressings. Salsa. Other foods Salted popcorn and pretzels. Corn chips and puffs. Potato and tortilla chips. Canned or dried soups. Pizza. Frozen entrees and pot pies. The items listed above may not be a complete list of foods and beverages you should avoid. Contact a dietitian for more information. Summary Eating  less sodium can help lower your blood pressure, reduce swelling, and protect your heart, liver, and kidneys. Most people on this plan should limit their sodium intake to 1,500-2,000 mg (milligrams) of sodium each day. Canned, boxed, and frozen foods are high in sodium. Restaurant foods, fast foods, and pizza are also very high in sodium. You also get sodium by adding salt to food. Try to cook at home, eat more fresh fruits and vegetables, and eat less fast food and canned, processed, or prepared foods. This information is not intended to replace advice given to you by your health care provider. Make sure you discuss any questions you have with your health care provider. Document Revised: 07/28/2019 Document Reviewed: 05/24/2019 Elsevier Patient Education  [place]. Hypoglycemia Hypoglycemia occurs when the level of sugar (glucose) in the blood is too low. Hypoglycemia can happen in people who have or do not have diabetes. It can develop quickly, and it can be a medical emergency. For most people, a blood glucose level below 70 mg/dL (3.9 mmol/L) is considered hypoglycemia. Glucose is a type of sugar that provides the body's main source of energy. Certain hormones (insulin and glucagon) control the level of glucose in the blood. Insulin lowers blood glucose, and glucagon raises blood glucose. Hypoglycemia can result from having too much insulin in the bloodstream, or from not eating enough food that contains glucose. You may also have reactive hypoglycemia, which happens within 4 hours after eating a meal. What are the causes? Hypoglycemia occurs most often in people who have diabetes  and may be caused by: Diabetes medicine. Not eating enough, or not eating often enough. Increased physical activity. Drinking alcohol on an empty stomach. If you do not have diabetes, hypoglycemia may be caused by: A tumor in the pancreas. Not eating enough, or not eating for long periods at a time  (fasting). A severe infection or illness. Problems after having bariatric surgery. Organ failure, such as kidney or liver failure. Certain medicines. What increases the risk? Hypoglycemia is more likely to develop in people who: Have diabetes and take medicines to lower blood glucose. Abuse alcohol. Have a severe illness. What are the signs or symptoms? Symptoms vary depending on whether the condition is mild, moderate, or severe. Mild hypoglycemia Hunger. Sweating and feeling clammy. Dizziness or feeling light-headed. Sleepiness or restless sleep. Nausea. Increased heart rate. Headache. Blurry vision. Mood changes, such as irritability or anxiety. Tingling or numbness around the mouth, lips, or tongue. Moderate hypoglycemia Confusion and poor judgment. Behavior changes. Weakness. Irregular heartbeat. A change in coordination. Severe hypoglycemia Severe hypoglycemia is a medical emergency. It can cause: Fainting. Seizures. Loss of consciousness (coma). Death. How is this diagnosed? Hypoglycemia is diagnosed with a blood test to measure your blood glucose level. This blood test is done while you are having symptoms. Your health care provider may also do a physical exam and review your medical history. How is this treated? This condition can be treated by immediately eating or drinking something that contains sugar with 15 grams of fast-acting carbohydrate, such as: 4 oz (120 mL) of fruit juice. 4 oz (120 mL) of regular soda (not diet soda). Several pieces of hard candy. Check food labels to find out how many pieces to eat for 15 grams. 1 Tbsp (15 mL) of sugar or honey. 4 glucose tablets. 1 tube of glucose gel. Treating hypoglycemia if you have diabetes If you are alert and able to swallow safely, follow the 15:15 rule: Take 15 grams of a fast-acting carbohydrate. Talk with your health care provider about how much you should take. Options for getting 15 grams of  fast-acting carbohydrate include: Glucose tablets (take 4 tablets). Several pieces of hard candy. Check food labels to find out how many pieces to eat for 15 grams. 4 oz (120 mL) of fruit juice. 4 oz (120 mL) of regular soda (not diet soda). 1 Tbsp (15 mL) of sugar or honey. 1 tube of glucose gel. Check your blood glucose 15 minutes after you take the carbohydrate. If the repeat blood glucose level is still at or below 70 mg/dL (3.9 mmol/L), take 15 grams of a carbohydrate again. If your blood glucose level does not increase above 70 mg/dL (3.9 mmol/L) after 3 tries, seek emergency medical care. After your blood glucose level returns to normal, eat a meal or a snack within 1 hour.  Treating severe hypoglycemia Severe hypoglycemia is when your blood glucose level is below 54 mg/dL (3 mmol/L). Severe hypoglycemia is a medical emergency. Get medical help right away. If you have severe hypoglycemia and you cannot eat or drink, you will need to be given glucagon. A family member or close friend should learn how to check your blood glucose and how to give you glucagon. Ask your health care provider if you need to have an emergency glucagon kit available. Severe hypoglycemia may need to be treated in a hospital. The treatment may include getting glucose through an IV. You may also need treatment for the cause of your hypoglycemia. Follow these instructions at home:  General instructions Take over-the-counter and prescription medicines only as told by your health care provider. Monitor your blood glucose as told by your health care provider. If you drink alcohol: Limit how much you have to: 0-1 drink a day for women who are not pregnant. 0-2 drinks a day for men. Know how much alcohol is in your drink. In the [place] one drink equals one 12 oz bottle of beer (355 mL), one 5 oz glass of wine (148 mL), or one 1 oz glass of hard liquor (44 mL). Be sure to eat food along with drinking alcohol. Be aware  that alcohol is absorbed quickly and may have lingering effects that may result in hypoglycemia later. Be sure to do ongoing glucose monitoring. Keep all follow-up visits. This is important. If you have diabetes: Always have a fast-acting carbohydrate (15 grams) option with you to treat low blood glucose. Follow your diabetes management plan as directed by your health care provider. Make sure you: Know the symptoms of hypoglycemia. It is important to treat it right away to prevent it from becoming severe. Check your blood glucose as often as told. Always check before and after exercise. Always check your blood glucose before you drive a motorized vehicle. Take your medicines as told. Follow your meal plan. Eat on time, and do not skip meals. Share your diabetes management plan with people in your workplace, school, and household. Carry a medical alert card or wear medical alert jewelry. Where to find more information American Diabetes Association: www.diabetes.org Contact a health care provider if: You have problems keeping your blood glucose in your target range. You have frequent episodes of hypoglycemia. Get help right away if: You continue to have hypoglycemia symptoms after eating or drinking something that contains 15 grams of fast-acting carbohydrate, and you cannot get your blood glucose above 70 mg/dL (3.9 mmol/L) while following the 15:15 rule. Your blood glucose is below 54 mg/dL (3 mmol/L). You have a seizure. You faint. These symptoms may represent a serious problem that is an emergency. Do not wait to see if the symptoms will go away. Get medical help right away. Call your local emergency services (911 in the [place] Do not drive yourself to the hospital. Summary Hypoglycemia occurs when the level of sugar (glucose) in the blood is too low. Hypoglycemia can happen in people who have or do not have diabetes. It can develop quickly, and it can be a medical emergency. Make sure  you know the symptoms of hypoglycemia and how to treat it. Always have a fast-acting carbohydrate option with you to treat low blood sugar. This information is not intended to replace advice given to you by your health care provider. Make sure you discuss any questions you have with your health care provider. Document Revised: 05/23/2020 Document Reviewed: 05/23/2020 Elsevier Patient Education  [place].

## 2022-05-18 NOTE — Chronic Care Management (AMB) (Signed)
Chronic Care Management   CCM RN Visit Note  05/18/2022 Name: Denise Meadows MRN: [id number] DOB: 18-Apr-1951  Subjective: Denise Meadows is a 71 y.o. year old female who is a primary care patient of Denise Pretty, FNP. The patient was referred to the Chronic Care Management team for assistance with care management needs subsequent to provider initiation of CCM services and plan of care.    Today's Visit:  Engaged with patient by telephone for initial visit.     SDOH Interventions Today    Flowsheet Row Most Recent Value  SDOH Interventions   Food Insecurity Interventions Intervention Not Indicated  Housing Interventions Intervention Not Indicated  Transportation Interventions Intervention Not Indicated  Utilities Interventions Intervention Not Indicated  Financial Strain Interventions Intervention Not Indicated  Physical Activity Interventions Intervention Not Indicated  Stress Interventions Intervention Not Indicated  Social Connections Interventions Intervention Not Indicated         Goals Addressed             This Visit's Progress    CCM (CHRONIC KIDNEY DISEASE) EXPECTED OUTCOME: MONITOR, SELF-MANAGE AND REDUCE SYMPTOMS OF CHRONIC KIDNEY DISEASE       Current Barriers:  Knowledge Deficits related to Chronic Kidney Disease management Chronic Disease Management support and education needs related to Chronic Kidney Disease  Planned Interventions: Evaluation of current treatment plan related to chronic kidney disease self management and patient's adherence to plan as established by provider      Reviewed prescribed diet low sodium Reviewed medications with patient and discussed importance of compliance    Counseled on the importance of exercise goals with target of 150 minutes per week     Advised patient, providing education and rationale, to monitor blood pressure daily and record, calling PCP for findings outside established parameters    Discussed plans with  patient for ongoing care management follow up and provided patient with direct contact information for care management team    Screening for signs and symptoms of depression related to chronic disease state      Assessed social determinant of health barriers    Reviewed importance of drinking adequate fluids Reviewed importance of overall healthy lifestyle, nutrition  Symptom Management: Take medications as prescribed   Attend all scheduled provider appointments Call pharmacy for medication refills 3-7 days in advance of running out of medications Attend church or other social activities Perform all self care activities independently  Perform IADL's (shopping, preparing meals, housekeeping, managing finances) independently Call provider office for new concerns or questions  Drink adequate fluids, preferably water Continue exercising  Follow Up Plan: Telephone follow up appointment with care management team member scheduled for:  07/03/22 at 9 am       CCM (DIABETES) EXPECTED OUTCOME:  MONITOR, SELF-MANAGE AND REDUCE SYMPTOMS OF DIABETES       Current Barriers:  Knowledge Deficits related to Diabetes management Chronic Disease Management support and education needs related to Diabetes and diet No Advanced Directives in place- has living will, requests documents for HCPOA Patient reports she checks CBG once per week due to "since [facility] is so good, agreed upon I can check CBG once per week"  pt reports readings are usually 99-120, pt reports she sometimes follows carbohydrate modified diet, pt does exercise  Planned Interventions: Provided education to patient about basic DM disease process; Reviewed medications with patient and discussed importance of medication adherence;        Reviewed prescribed diet with patient carbohydrate modified, gave examples of  foods high in carbohydrates; Discussed plans with patient for ongoing care management follow up and provided patient with direct  contact information for care management team;      Provided patient with written educational materials related to hypo and hyperglycemia and importance of correct treatment;       Review of patient status, including review of consultants reports, relevant laboratory and other test results, and medications completed;       Screening for signs and symptoms of depression related to chronic disease state;        Assessed social determinant of health barriers;        Reviewed importance of exercise  Symptom Management: Take medications as prescribed   Attend all scheduled provider appointments Call pharmacy for medication refills 3-7 days in advance of running out of medications Attend church or other social activities Perform all self care activities independently  Perform IADL's (shopping, preparing meals, housekeeping, managing finances) independently Call provider office for new concerns or questions  check blood sugar at prescribed times: per pt report checks once weekly since [facility] is good  check feet daily for cuts, sores or redness enter blood sugar readings and medication or insulin into daily log take the blood sugar log to all doctor visits take the blood sugar meter to all doctor visits trim toenails straight across fill half of plate with vegetables read food labels for fat, fiber, carbohydrates and portion size Look over education via My Chart- hypoglycemia  Follow Up Plan: Telephone follow up appointment with care management team member scheduled for: 07/03/22 at 9 am       CCM (HYPERTENSION) EXPECTED OUTCOME: MONITOR, SELF-MANAGE AND REDUCE SYMPTOMS OF HYPERTENSION       Current Barriers:  Knowledge Deficits related to Hypertension management Chronic Disease Management support and education needs related to Hypertension and diet No Advanced Directives in place- pt reports she has Living will, would like documents mailed for HCPOA Patient reports she lives with spouse, is  independent in all aspects of her care, has all medication and taking as prescribed, checks blood pressure 2-3 x per week and states " readings are are always good", pt reports she walks 3-5 times per week, tries to eat healthy as much as possible and adheres to low sodium diet  Planned Interventions: Evaluation of current treatment plan related to hypertension self management and patient's adherence to plan as established by provider;   Reviewed prescribed diet low sodium, food choices Reviewed medications with patient and discussed importance of compliance;  Counseled on the importance of exercise goals with target of 150 minutes per week Discussed plans with patient for ongoing care management follow up and provided patient with direct contact information for care management team; Advised patient, providing education and rationale, to monitor blood pressure daily and record, calling PCP for findings outside established parameters;  Provided education on prescribed diet low sodium;  Discussed complications of poorly controlled blood pressure such as heart disease, stroke, circulatory complications, vision complications, kidney impairment, sexual dysfunction;  Screening for signs and symptoms of depression related to chronic disease state;  Assessed social determinant of health barriers;   Symptom Management: Take medications as prescribed   Attend all scheduled provider appointments Call pharmacy for medication refills 3-7 days in advance of running out of medications Attend church or other social activities Perform all self care activities independently  Perform IADL's (shopping, preparing meals, housekeeping, managing finances) independently Call provider office for new concerns or questions  check blood pressure  3 times per week write blood pressure results in a log or diary take blood pressure log to all doctor appointments call doctor for signs and symptoms of high blood  pressure keep all doctor appointments take medications for blood pressure exactly as prescribed eat more whole grains, fruits and vegetables, lean meats and healthy fats Look over education via My Chart- low sodium diet Continue walking 3-5 times per week, keep up the good work!  Follow Up Plan: Telephone follow up appointment with care management team member scheduled for:  07/03/22 at 9 am          Plan:Telephone follow up appointment with care management team member scheduled for:  07/03/22 at 9 am  Jacqlyn Larsen [facility], BSN RN Case Manager [place] [phone]

## 2022-05-18 NOTE — Plan of Care (Signed)
Chronic Care Management Provider Comprehensive Care Plan    05/18/2022 Name: Denise Meadows MRN: [id number] DOB: [birth year]  Referral to Chronic Care Management (CCM) services was placed by Provider:  Chevis Pretty FNP on Date: 04/30/22.  Chronic Condition 1: Hypertension Provider Assessment and Plan  Primary hypertension  Well controlled on current regimen.  - CBC with Differential/Platelet - CMP14+EGFR - Lipid panel   Expected Outcome/Goals Addressed This Visit (Provider CCM goals/Provider Assessment and plan  CCM (HYPERTENSION) EXPECTED OUTCOME: MONITOR, SELF-MANAGE AND REDUCE SYMPTOMS OF HYPERTENSION  Symptom Management Condition 1: Hypertension Take medications as prescribed   Attend all scheduled provider appointments Call pharmacy for medication refills 3-7 days in advance of running out of medications Attend church or other social activities Perform all self care activities independently  Perform IADL's (shopping, preparing meals, housekeeping, managing finances) independently Call provider office for new concerns or questions  check blood pressure 3 times per week write blood pressure results in a log or diary take blood pressure log to all doctor appointments call doctor for signs and symptoms of high blood pressure keep all doctor appointments take medications for blood pressure exactly as prescribed eat more whole grains, fruits and vegetables, lean meats and healthy fats Look over education via My Chart- low sodium diet Continue walking 3-5 times per week, keep up the good work!  Chronic Condition 2: Diabetes Provider Assessment and Plan . Type 2 diabetes mellitus with hyperglycemia, without long-term current use of insulin (HCC) A1c 6.6 today which has increased slightly from 6.3 six months ago. - Diabetes is at goal of A1c < 7. - Medications: continue current medications - Home glucose monitoring: Continue monitoring - Patient is currently taking a  statin. Patient is taking an ACE-inhibitor/ARB.  - Instruction/counseling given: discussed diet   Expected Outcome/Goals Addressed This Visit (Provider CCM goals/Provider Assessment and plan  CCM (DIABETES) EXPECTED OUTCOME:  MONITOR, SELF-MANAGE AND REDUCE SYMPTOMS OF DIABETES  Symptom Management Condition 2: Diabetes Take medications as prescribed   Attend all scheduled provider appointments Call pharmacy for medication refills 3-7 days in advance of running out of medications Attend church or other social activities Perform all self care activities independently  Perform IADL's (shopping, preparing meals, housekeeping, managing finances) independently Call provider office for new concerns or questions  check blood sugar at prescribed times: per pt report checks once weekly since [facility] is good  check feet daily for cuts, sores or redness enter blood sugar readings and medication or insulin into daily log take the blood sugar log to all doctor visits take the blood sugar meter to all doctor visits trim toenails straight across fill half of plate with vegetables read food labels for fat, fiber, carbohydrates and portion size Look over education via My Chart- hypoglycemia  Chronic Condition 3: CKD stage 3 Provider Assessment and Plan  Stage 3a chronic kidney disease ([place]) Continue Farxiga daily. - CBC with Differential/Platelet - CMP14+EGFR - dapagliflozin propanediol (FARXIGA) 10 MG TABS tablet; Take 1 tablet (10 mg total) by mouth daily before breakfast.  Dispense: 90 tablet; Refill: 3   Expected Outcome/Goals Addressed This Visit (Provider CCM goals/Provider Assessment and plan  CCM (CHRONIC KIDNEY DISEASE) EXPECTED OUTCOME: MONITOR, SELF-MANAGE AND REDUCE SYMPTOMS OF CHRONIC KIDNEY DISEASE  Symptom Management Condition 3: CKD stage 3 Take medications as prescribed   Attend all scheduled provider appointments Call pharmacy for medication refills 3-7 days in advance of running out  of medications Attend church or other social activities Perform all self  care activities independently  Perform IADL's (shopping, preparing meals, housekeeping, managing finances) independently Call provider office for new concerns or questions  Drink adequate fluids, preferably water Continue exercising  Problem List Patient Active Problem List   Diagnosis Date Noted   Aortic atherosclerosis ([place]) 05/14/2021   Stage 3a chronic kidney disease ([place]) 05/14/2021   Osteoporosis 01/29/2021   Panic attacks 10/28/2020   Controlled substance agreement signed 25/95/6387   Diastolic dysfunction 56/43/3295   Reduced libido 07/27/2018   HTN (hypertension) 08/29/2012   HLD (hyperlipidemia) 08/29/2012   Vitamin D deficiency 08/29/2012   Type 2 diabetes mellitus with hyperglycemia, without long-term current use of insulin ([place]) 08/29/2012   Rosacea 08/29/2012   H/O nephrolithotomy with removal of calculi 08/29/2012   Gastroesophageal reflux disease 01/18/2012   Neck mass 10/01/2010   Insomnia 10/01/2010   Hiatal hernia     Medication Management  Current Outpatient Medications:    calcium gluconate 500 MG tablet, Take 1 tablet (500 mg total) 2 (two) times daily by mouth., Disp: 60 tablet, Rfl: 11   Cholecalciferol (VITAMIN D) 2000 units tablet, Take 1 tablet (2,000 Units total) by mouth daily., Disp: 90 tablet, Rfl: 2   dapagliflozin propanediol (FARXIGA) 10 MG TABS tablet, Take 1 tablet (10 mg total) by mouth daily before breakfast., Disp: 90 tablet, Rfl: 3   diazepam (VALIUM) 2 MG tablet, Take 1 tablet (2 mg total) by mouth every 6 (six) hours as needed for anxiety., Disp: 20 tablet, Rfl: 0   glucose blood test strip, Please dispense as OneTouch Ultra 2. Use as directed to monitor FSBS 1x weekly. Dx: E11.9., Disp: 100 each, Rfl: 3   Lancets (ONETOUCH ULTRASOFT) lancets, Use as directed to monitor FSBS 1x weekly. Dx: E11.9., Disp: 100 each, Rfl: 3   metFORMIN (GLUCOPHAGE) 1000 MG tablet, TAKE  1 TABLET BY MOUTH  DAILY WITH BREAKFAST, Disp: 90 tablet, Rfl: 3   metoprolol succinate (TOPROL-XL) 25 MG 24 hr tablet, TAKE 1 TABLET BY MOUTH ONCE  DAILY, Disp: 90 tablet, Rfl: 3   metroNIDAZOLE (METROGEL) 0.75 % gel, Apply 1 application topically as needed., Disp: , Rfl:    Multiple Vitamin (MULTIVITAMIN WITH MINERALS) TABS tablet, Take 1 tablet by mouth daily., Disp: , Rfl:    Omega-3 Fatty Acids (FISH OIL) 1000 MG CAPS, Take 1 capsule (1,000 mg total) by mouth 2 (two) times daily., Disp: , Rfl: 0   omeprazole (PRILOSEC) 20 MG capsule, TAKE 1 CAPSULE BY MOUTH  DAILY AS NEEDED, Disp: 100 capsule, Rfl: 0   ramipril (ALTACE) 10 MG capsule, TAKE 1 CAPSULE BY MOUTH DAILY, Disp: 90 capsule, Rfl: 3   simvastatin (ZOCOR) 20 MG tablet, TAKE 1 TABLET BY MOUTH AT  BEDTIME, Disp: 90 tablet, Rfl: 3   vitamin B-12 (CYANOCOBALAMIN) 100 MCG tablet, Take 1 tablet (100 mcg total) by mouth daily., Disp: , Rfl:    methocarbamol (ROBAXIN) 500 MG tablet, Take 1 tablet (500 mg total) by mouth every 8 (eight) hours as needed for muscle spasms. (Patient not taking: Reported on 05/18/2022), Disp: 60 tablet, Rfl: 0  Cognitive Assessment Identity Confirmed: : Name; DOB Cognitive Status: Normal   Functional Assessment Hearing Difficulty or Deaf: no Wear Glasses or Blind: yes Vision Management: can see well w/ glasses Concentrating, Remembering or Making Decisions Difficulty (CP): no Difficulty Communicating: no Difficulty Eating/Swallowing: no Walking or Climbing Stairs Difficulty: no Dressing/Bathing Difficulty: no Doing Errands Independently Difficulty (such as shopping) (CP): no Change in Functional Status Since Onset of Current Illness/Injury: no  Caregiver Assessment  Primary Source of Support/Comfort: spouse Name of Support/Comfort Primary Source: Murdis Flitton spouse People in Home: spouse Name(s) of People in Home: Shatana Saxton Family Caregiver if Needed: spouse Primary Roles/Responsibilities:  retired   Planned Interventions  Evaluation of current treatment plan related to hypertension self management and patient's adherence to plan as established by provider;   Reviewed prescribed diet low sodium, food choices Reviewed medications with patient and discussed importance of compliance;  Counseled on the importance of exercise goals with target of 150 minutes per week Discussed plans with patient for ongoing care management follow up and provided patient with direct contact information for care management team; Advised patient, providing education and rationale, to monitor blood pressure daily and record, calling PCP for findings outside established parameters;  Provided education on prescribed diet low sodium;  Discussed complications of poorly controlled blood pressure such as heart disease, stroke, circulatory complications, vision complications, kidney impairment, sexual dysfunction;  Screening for signs and symptoms of depression related to chronic disease state;  Assessed social determinant of health barriers;  Provided education to patient about basic DM disease process; Reviewed medications with patient and discussed importance of medication adherence;        Reviewed prescribed diet with patient carbohydrate modified, gave examples of foods high in carbohydrates; Discussed plans with patient for ongoing care management follow up and provided patient with direct contact information for care management team;      Provided patient with written educational materials related to hypo and hyperglycemia and importance of correct treatment;       Review of patient status, including review of consultants reports, relevant laboratory and other test results, and medications completed;       Screening for signs and symptoms of depression related to chronic disease state;        Assessed social determinant of health barriers;        Reviewed importance of exercise Evaluation of current  treatment plan related to chronic kidney disease self management and patient's adherence to plan as established by provider      Reviewed prescribed diet low sodium Reviewed medications with patient and discussed importance of compliance    Counseled on the importance of exercise goals with target of 150 minutes per week     Advised patient, providing education and rationale, to monitor blood pressure daily and record, calling PCP for findings outside established parameters    Discussed plans with patient for ongoing care management follow up and provided patient with direct contact information for care management team    Screening for signs and symptoms of depression related to chronic disease state      Assessed social determinant of health barriers    Reviewed importance of drinking adequate fluids Reviewed importance of overall healthy lifestyle, nutrition  Interaction and coordination with outside resources, practitioners, and providers See CCM Referral  Care Plan: Available in MyChart

## 2022-06-03 ENCOUNTER — Other Ambulatory Visit: Payer: Self-pay | Admitting: Family Medicine

## 2022-06-03 ENCOUNTER — Other Ambulatory Visit: Payer: Self-pay | Admitting: Nurse Practitioner

## 2022-06-03 DIAGNOSIS — E1165 Type 2 diabetes mellitus with hyperglycemia: Secondary | ICD-10-CM

## 2022-06-03 DIAGNOSIS — K219 Gastro-esophageal reflux disease without esophagitis: Secondary | ICD-10-CM

## 2022-06-04 DIAGNOSIS — N1831 Chronic kidney disease, stage 3a: Secondary | ICD-10-CM | POA: Diagnosis not present

## 2022-06-04 DIAGNOSIS — E1122 Type 2 diabetes mellitus with diabetic chronic kidney disease: Secondary | ICD-10-CM

## 2022-06-04 DIAGNOSIS — I129 Hypertensive chronic kidney disease with stage 1 through stage 4 chronic kidney disease, or unspecified chronic kidney disease: Secondary | ICD-10-CM

## 2022-06-16 DIAGNOSIS — E1165 Type 2 diabetes mellitus with hyperglycemia: Secondary | ICD-10-CM

## 2022-06-16 NOTE — Addendum Note (Signed)
Addended by: Michaela Corner on: 06/16/2022 01:25 PM   Modules accepted: Orders

## 2022-06-16 NOTE — Progress Notes (Signed)
[facility] Quality Team Note  Name: TYNIKA LUDDY Date of Birth: 1950/10/18 MRN: [id number] Date: 06/16/2022  [facility] Quality Team has reviewed this patient's chart, please see recommendations below:  [facility] Quality Other; KED: Kidney Health Evaluation Gap- Patient needs Urine Albumin Creatinine Ratio Test completed for gap closure. EGFR has already been completed).

## 2022-06-16 NOTE — Progress Notes (Signed)
Spoke with patient, she will come in to have urine microalbumin done soon.  Order placed.

## 2022-06-24 ENCOUNTER — Ambulatory Visit (INDEPENDENT_AMBULATORY_CARE_PROVIDER_SITE_OTHER): Payer: Medicare Other | Admitting: Pharmacist

## 2022-06-24 ENCOUNTER — Telehealth: Payer: Self-pay | Admitting: Pharmacist

## 2022-06-24 DIAGNOSIS — E1165 Type 2 diabetes mellitus with hyperglycemia: Secondary | ICD-10-CM

## 2022-06-24 DIAGNOSIS — N1831 Chronic kidney disease, stage 3a: Secondary | ICD-10-CM

## 2022-06-24 NOTE — Telephone Encounter (Signed)
Please make sure patient is re-enrolled for farxiga  Thank you! It may have auto-enrolled

## 2022-06-25 ENCOUNTER — Other Ambulatory Visit: Payer: Medicare Other

## 2022-06-25 DIAGNOSIS — E1165 Type 2 diabetes mellitus with hyperglycemia: Secondary | ICD-10-CM

## 2022-06-25 NOTE — Telephone Encounter (Signed)
Patient stated she got a letter that she was re-enrolled for farxiga

## 2022-06-25 NOTE — Patient Instructions (Signed)
Visit Information  Following are the goals we discussed today:  Current Barriers:  Unable to independently afford treatment regimen Unable to achieve control of T2DM, CKD   Pharmacist Clinical Goal(s):  patient will verbalize ability to afford treatment regimen achieve control of T2DM/CKD as evidenced by GOAL A1C, GFR GOAL through collaboration with PharmD and provider.    Interventions: 1:1 collaboration with Chevis Pretty, FNP regarding development and update of comprehensive plan of care as evidenced by provider attestation and co-signature Inter-disciplinary care team collaboration (see longitudinal plan of care) Comprehensive medication review performed; medication list updated in electronic medical record  Diabetes: Goal on Track (progressing): YES. Controlled-A1C 6.6%; current treatment: METFORMIN, FARXIGA;  Given GFR 49-->50 (CKD3a), CONTINUE Farxiga  Will RE-apply for patient assistance via [employer] We can look at decreasing/discontinuing metformin in the future Discussed meal planning options and Plate method for healthy eating LIMIT/avoid sugary drinks and desserts Incorporate balanced protein, non starchy veggies, 1 serving of carbohydrate with each meal Increase water intake Increase physical activity as able Current exercise: encouraged Assessed patient finances. Application sent to [employer] patient assistance    Patient Goals/Self-Care Activities patient will:  - take medications as prescribed as evidenced by patient report and record review check glucose daily or if symptomatic, document, and provide at future appointments collaborate with provider on medication access solutions target a minimum of 150 minutes of moderate intensity exercise weekly engage in dietary modifications by FOLLOWING A HEART HEALTHY DIET/HEALTHY PLATE METHOD    Plan: Telephone follow up appointment with care management team member scheduled for:  3 months  Signature Regina Eck, PharmD, BCPS, [place] Clinical Pharmacist, [place]  II  T [phone]   Please call the care guide team at [phone] if you need to cancel or reschedule your appointment.   The patient verbalized understanding of instructions, educational materials, and care plan provided today and DECLINED offer to receive copy of patient instructions, educational materials, and care plan.

## 2022-06-25 NOTE — Progress Notes (Signed)
Chronic Care Management Pharmacy Note  06/24/2022 Name:  Denise Meadows MRN:  [id number] DOB:  Nov 15, 1950  Summary:  Diabetes: Goal on Track (progressing): YES. Controlled-A1C 6.6%; current treatment: METFORMIN, FARXIGA;  Given GFR 49-->50 (CKD3a), CONTINUE Farxiga  Will RE-apply for patient assistance via AZ&me We can look at decreasing/discontinuing metformin in the future Discussed meal planning options and Plate method for healthy eating LIMIT/avoid sugary drinks and desserts Incorporate balanced protein, non starchy veggies, 1 serving of carbohydrate with each meal Increase water intake Increase physical activity as able Current exercise: encouraged Assessed patient finances. Application sent to az&me patient assistance    Patient Goals/Self-Care Activities patient will:  - take medications as prescribed as evidenced by patient report and record review check glucose daily or if symptomatic, document, and provide at future appointments collaborate with provider on medication access solutions target a minimum of 150 minutes of moderate intensity exercise weekly engage in dietary modifications by FOLLOWING A HEART HEALTHY DIET/HEALTHY PLATE METHOD    Subjective: Denise Meadows is an 71 y.o. year old female who is a primary patient of Chevis Pretty, [place].  The patient was referred to the Chronic Care Management team for assistance with care management needs subsequent to provider initiation of CCM services and plan of care.    Engaged with patient by telephone for follow up visit in response to provider referral for CCM services.   Objective:  LABS:   Lab Results  Component Value Date   CREATININE 1.17 (H) 03/24/2022   CREATININE 1.19 (H) 09/11/2021   CREATININE 1.12 (H) 05/14/2021     Lab Results  Component Value Date   HGBA1C 6.6 (H) 03/24/2022         Component Value Date/Time   CHOL 152 03/24/2022 0914   TRIG 178 (H) 03/24/2022 0914   HDL 48  03/24/2022 0914   CHOLHDL 3.2 03/24/2022 0914   CHOLHDL 3.6 04/17/2020 0834   VLDL 35 (H) 10/07/2016 0846   LDLCALC 74 03/24/2022 0914   LDLCALC 91 04/17/2020 0834     Clinical ASCVD: No   The 10-year ASCVD risk score (Arnett DK, et al., 2019) is: 20.9%   Values used to calculate the score:     Age: 28 years     Sex: Female     Is Non-Hispanic African American: No     Diabetic: Yes     Tobacco smoker: No     Systolic Blood Pressure: 308 mmHg     Is BP treated: Yes     HDL Cholesterol: 48 mg/dL     Total Cholesterol: 152 mg/dL    Other: (CHADS2VASc if Afib, PHQ9 if depression, MMRC or CAT for COPD, ACT, DEXA)    BP Readings from Last 3 Encounters:  04/20/22 118/74  03/24/22 105/61  01/14/22 118/72      SDOH:  (Social Determinants of Health) assessments and interventions performed:    Allergies  Allergen Reactions   Acetaminophen Nausea And Vomiting   Codeine Nausea And Vomiting   Penicillins Rash    Medications Reviewed Today     Reviewed by Kassie Mends, RN (Registered Nurse) on 05/18/22 at [id number]  Med List Status: <None>   Medication Order Taking? Sig Documenting Provider Last Dose Status Informant  calcium gluconate 500 MG tablet [id number] Yes Take 1 tablet (500 mg total) 2 (two) times daily by mouth. Orlena Sheldon, PA-C Taking Active Self  Cholecalciferol (VITAMIN D) 2000 units tablet [id number] Yes Take 1 tablet (2,000 Units  total) by mouth daily. Orlena Sheldon, PA-C Taking Active Self  dapagliflozin propanediol (FARXIGA) 10 MG TABS tablet [id number] Yes Take 1 tablet (10 mg total) by mouth daily before breakfast. Chevis Pretty, FNP Taking Active   diazepam (VALIUM) 2 MG tablet [id number] Yes Take 1 tablet (2 mg total) by mouth every 6 (six) hours as needed for anxiety. Alycia Rossetti, MD Taking Active   glucose blood test strip [id number] Yes Please dispense as OneTouch Ultra 2. Use as directed to monitor FSBS 1x weekly. Dx: E11.9. Buelah Manis, Modena Nunnery, MD  Taking Active   Lancets ([place]) lancets [id number] Yes Use as directed to monitor FSBS 1x weekly. Dx: E11.9. Alycia Rossetti, MD Taking Active   metFORMIN (GLUCOPHAGE) 1000 MG tablet [id number] Yes TAKE 1 TABLET BY MOUTH  DAILY WITH BREAKFAST Loman Brooklyn, FNP Taking Active   methocarbamol (ROBAXIN) 500 MG tablet [id number] No Take 1 tablet (500 mg total) by mouth every 8 (eight) hours as needed for muscle spasms.  Patient not taking: Reported on 05/18/2022   Loman Brooklyn, FNP Not Taking Active   metoprolol succinate (TOPROL-XL) 25 MG 24 hr tablet [id number] Yes TAKE 1 TABLET BY MOUTH ONCE  DAILY Loman Brooklyn, FNP Taking Active   metroNIDAZOLE (METROGEL) 0.75 % gel [id number] Yes Apply 1 application topically as needed. [provider] Taking Active   Multiple Vitamin (MULTIVITAMIN WITH MINERALS) TABS tablet [id number] Yes Take 1 tablet by mouth daily. [provider] Taking Active   Omega-3 Fatty Acids (FISH OIL) 1000 MG CAPS [id number] Yes Take 1 capsule (1,000 mg total) by mouth 2 (two) times daily. Alycia Rossetti, MD Taking Active Self  omeprazole (PRILOSEC) 20 MG capsule [id number] Yes TAKE 1 CAPSULE BY MOUTH  DAILY AS NEEDED Hassell Done, Mary-Margaret, FNP Taking Active   ramipril (ALTACE) 10 MG capsule [id number] Yes TAKE 1 CAPSULE BY MOUTH DAILY Loman Brooklyn, FNP Taking Active   simvastatin (ZOCOR) 20 MG tablet [id number] Yes TAKE 1 TABLET BY MOUTH AT  BEDTIME Loman Brooklyn, FNP Taking Active   vitamin B-12 (CYANOCOBALAMIN) 100 MCG tablet [id number] Yes Take 1 tablet (100 mcg total) by mouth daily. Buelah Manis, Modena Nunnery, MD Taking Active Self  Med List Note Lavera Guise [facility] 10/16/21 1038): Send [facility]xiga to [facility]--az&me patient assistance              Goals Addressed               This Visit's Progress     Patient Stated     T2DM PHARMD GOAL (pt-stated)        Current Barriers:  Unable to independently afford treatment  regimen Unable to achieve control of T2DM, CKD   Pharmacist Clinical Goal(s):  patient will verbalize ability to afford treatment regimen achieve control of T2DM/CKD as evidenced by GOAL A1C, GFR GOAL through collaboration with PharmD and provider.    Interventions: 1:1 collaboration with Chevis Pretty, FNP regarding development and update of comprehensive plan of care as evidenced by provider attestation and co-signature Inter-disciplinary care team collaboration (see longitudinal plan of care) Comprehensive medication review performed; medication list updated in electronic medical record  Diabetes: Goal on Track (progressing): YES. Controlled-A1C 6.6%; current treatment: METFORMIN, FARXIGA;  Given GFR 49-->50 (CKD3a), CONTINUE Farxiga  Will RE-apply for patient assistance via AZ&me We can look at decreasing/discontinuing metformin in the future Discussed meal planning options and Plate method for healthy eating LIMIT/avoid sugary drinks and desserts Incorporate balanced protein, non  starchy veggies, 1 serving of carbohydrate with each meal Increase water intake Increase physical activity as able Current exercise: encouraged Assessed patient finances. Application sent to az&me patient assistance    Patient Goals/Self-Care Activities patient will:  - take medications as prescribed as evidenced by patient report and record review check glucose daily or if symptomatic, document, and provide at future appointments collaborate with provider on medication access solutions target a minimum of 150 minutes of moderate intensity exercise weekly engage in dietary modifications by FOLLOWING A HEART HEALTHY DIET/HEALTHY PLATE METHOD          Plan: Telephone follow up appointment with care management team member scheduled for:  3 MONTHS     Regina Eck, PharmD, BCPS, [place] Clinical Pharmacist, [place]  II  T  [phone]

## 2022-06-26 LAB — MICROALBUMIN / CREATININE URINE RATIO
Creatinine, Urine: 135.2 mg/dL
Microalb/Creat Ratio: 10 mg/g creat (ref 0–29)
Microalbumin, Urine: 13.9 ug/mL

## 2022-07-01 ENCOUNTER — Telehealth (INDEPENDENT_AMBULATORY_CARE_PROVIDER_SITE_OTHER): Payer: Medicare Other | Admitting: Nurse Practitioner

## 2022-07-01 ENCOUNTER — Encounter: Payer: Self-pay | Admitting: Nurse Practitioner

## 2022-07-01 DIAGNOSIS — R051 Acute cough: Secondary | ICD-10-CM

## 2022-07-01 MED ORDER — HYDROCODONE BIT-HOMATROP MBR 5-1.5 MG/5ML PO SOLN: 5.0000 mL | Freq: Three times a day (TID) | ORAL | 0 refills | Status: DC | PRN

## 2022-07-01 MED ORDER — PREDNISONE 20 MG PO TABS: 40.0000 mg | ORAL_TABLET | Freq: Every day | ORAL | 0 refills | Status: AC

## 2022-07-01 NOTE — Progress Notes (Signed)
Virtual Visit Consent   [name], you are scheduled for a virtual visit with [name], [place], a [facility] provider, today.     Just as with appointments in the office, your consent must be obtained to participate.  Your consent will be active for this visit and any virtual visit you may have with one of our providers in the next 365 days.     If you have a MyChart account, a copy of this consent can be sent to you electronically.  All virtual visits are billed to your insurance company just like a traditional visit in the office.    As this is a virtual visit, video technology does not allow for your provider to perform a traditional examination.  This may limit your provider's ability to fully assess your condition.  If your provider identifies any concerns that need to be evaluated in person or the need to arrange testing (such as labs, EKG, etc.), we will make arrangements to do so.     Although advances in technology are sophisticated, we cannot ensure that it will always work on either your end or our end.  If the connection with a video visit is poor, the visit may have to be switched to a telephone visit.  With either a video or telephone visit, we are not always able to ensure that we have a secure connection.     I need to obtain your verbal consent now.   Are you willing to proceed with your visit today? YES   SHABREE TEBBETTS has provided verbal consent on 07/01/2022 for a virtual visit (video or telephone).   [name], FNP   Date: 07/01/2022 11:07 AM   Virtual Visit via Video Note   I, [name], connected with Denise Meadows ([id number], [birth year]) on 07/01/22 at 11:35 AM EST by a video-enabled telemedicine application and verified that I am speaking with the correct person using two identifiers.  Location: Patient: Virtual Visit Location Patient: Home Provider: Virtual Visit Location Provider: Mobile   I discussed the limitations of  evaluation and management by telemedicine and the availability of in person appointments. The patient expressed understanding and agreed to proceed.    History of Present Illness: Denise Meadows is a 71 y.o. who identifies as a female who was assigned female at birth, and is being seen today for uri.  HPI: Emesis  This is a new problem. The current episode started yesterday. The problem occurs less than 2 times per day. The problem has been waxing and waning. There has been no fever. Associated symptoms include coughing. Pertinent negatives include no chest pain, chills or fever. She has tried nothing for the symptoms. Improvement on treatment: no vomiting since 130 AM.  Cough This is a new problem. The current episode started 1 to 4 weeks ago. The problem has been waxing and waning. The cough is Non-productive. Pertinent negatives include no chest pain, chills or fever. Treatments tried: mucinex. The treatment provided mild relief.    Review of Systems  Constitutional:  Negative for chills and fever.  Respiratory:  Positive for cough.   Cardiovascular:  Negative for chest pain.  Gastrointestinal:  Positive for vomiting.    Problems:  Patient Active Problem List   Diagnosis Date Noted   Aortic atherosclerosis ([place]) 05/14/2021   Stage 3a chronic kidney disease ([place]) 05/14/2021   Osteoporosis 01/29/2021   Panic attacks 10/28/2020   Controlled substance agreement signed 50/53/9767   Diastolic  dysfunction 12/01/2018   Reduced libido 07/27/2018   HTN (hypertension) 08/29/2012   HLD (hyperlipidemia) 08/29/2012   Vitamin D deficiency 08/29/2012   Type 2 diabetes mellitus with hyperglycemia, without long-term current use of insulin ([place]) 08/29/2012   Rosacea 08/29/2012   H/O nephrolithotomy with removal of calculi 08/29/2012   Gastroesophageal reflux disease 01/18/2012   Neck mass 10/01/2010   Insomnia 10/01/2010   Hiatal hernia     Allergies:  Allergies  Allergen Reactions    Acetaminophen Nausea And Vomiting   Codeine Nausea And Vomiting   Penicillins Rash   Medications:  Current Outpatient Medications:    calcium gluconate 500 MG tablet, Take 1 tablet (500 mg total) 2 (two) times daily by mouth., Disp: 60 tablet, Rfl: 11   Cholecalciferol (VITAMIN D) 2000 units tablet, Take 1 tablet (2,000 Units total) by mouth daily., Disp: 90 tablet, Rfl: 2   dapagliflozin propanediol (FARXIGA) 10 MG TABS tablet, Take 1 tablet (10 mg total) by mouth daily before breakfast., Disp: 90 tablet, Rfl: 3   diazepam (VALIUM) 2 MG tablet, Take 1 tablet (2 mg total) by mouth every 6 (six) hours as needed for anxiety., Disp: 20 tablet, Rfl: 0   glucose blood test strip, Please dispense as OneTouch Ultra 2. Use as directed to monitor FSBS 1x weekly. Dx: E11.9., Disp: 100 each, Rfl: 3   Lancets (ONETOUCH [place]TRASOFT) lancets, Use as directed to monitor FSBS 1x weekly. Dx: E11.9., Disp: 100 each, Rfl: 3   metFORMIN (GLUCOPHAGE) 1000 MG tablet, TAKE 1 TABLET BY MOUTH DAILY  WITH BREAKFAST, Disp: 100 tablet, Rfl: 0   methocarbamol (ROBAXIN) 500 MG tablet, Take 1 tablet (500 mg total) by mouth every 8 (eight) hours as needed for muscle spasms. (Patient not taking: Reported on 05/18/2022), Disp: 60 tablet, Rfl: 0   metoprolol succinate (TOPROL-XL) 25 MG 24 hr tablet, TAKE 1 TABLET BY MOUTH ONCE  DAILY, Disp: 90 tablet, Rfl: 3   metroNIDAZOLE (METROGEL) 0.75 % gel, Apply 1 application topically as needed., Disp: , Rfl:    Multiple Vitamin (MULTIVITAMIN WITH MINERALS) TABS tablet, Take 1 tablet by mouth daily., Disp: , Rfl:    Omega-3 Fatty Acids (FISH OIL) 1000 MG CAPS, Take 1 capsule (1,000 mg total) by mouth 2 (two) times daily., Disp: , Rfl: 0   omeprazole (PRILOSEC) 20 MG capsule, TAKE 1 CAPSULE BY MOUTH DAILY AS NEEDED, Disp: 100 capsule, Rfl: 0   ramipril (ALTACE) 10 MG capsule, TAKE 1 CAPSULE BY MOUTH DAILY, Disp: 90 capsule, Rfl: 3   simvastatin (ZOCOR) 20 MG tablet, TAKE 1 TABLET BY MOUTH AT   BEDTIME, Disp: 90 tablet, Rfl: 3   vitamin B-12 (CYANOCOBALAMIN) 100 MCG tablet, Take 1 tablet (100 mcg total) by mouth daily., Disp: , Rfl:   Observations/Objective: Patient is well-developed, well-nourished in no acute distress.  Resting comfortably  at home.  Head is normocephalic, atraumatic.  No labored breathing.  Speech is clear and coherent with logical content.  Patient is alert and oriented at baseline.  Raspy voice Deep dry cough  Assessment and Plan:  KELLY RANIERI in today with chief complaint of URI   1. Acute cough 1. Take meds as prescribed 2. Use a cool mist humidifier especially during the winter months and when heat has been humid. 3. Use saline nose sprays frequently 4. Saline irrigations of the nose can be very helpful if done frequently.  * 4X daily for 1 week*  * Use of a nettie pot can be helpful  with this. Follow directions with this* 5. Drink plenty of fluids 6. Keep thermostat turn down low 7.For any cough or congestion- hycodan 8. For fever or aces or pains- take tylenol or ibuprofen appropriate for age and weight.  * for fevers greater than 101 orally you may alternate ibuprofen and tylenol every  3 hours.    - HYDROcodone bit-homatropine (HYCODAN) 5-1.5 MG/5ML syrup; Take 5 mLs by mouth every 8 (eight) hours as needed for cough.  Dispense: 120 mL; Refill: 0 - predniSONE (DELTASONE) 20 MG tablet; Take 2 tablets (40 mg total) by mouth daily with breakfast for 5 days. 2 po daily for 5 days  Dispense: 10 tablet; Refill: 0     Follow Up Instructions: I discussed the assessment and treatment plan with the patient. The patient was provided an opportunity to ask questions and all were answered. The patient agreed with the plan and demonstrated an understanding of the instructions.  A copy of instructions were sent to the patient via MyChart.  The patient was advised to call back or seek an in-person evaluation if the symptoms worsen or if the condition  fails to improve as anticipated.  Time:  I spent 7 minutes with the patient via telehealth technology discussing the above problems/concerns.    [name], FNP

## 2022-07-01 NOTE — Patient Instructions (Signed)

## 2022-07-03 ENCOUNTER — Ambulatory Visit: Payer: Medicare Other | Admitting: *Deleted

## 2022-07-03 DIAGNOSIS — N1831 Chronic kidney disease, stage 3a: Secondary | ICD-10-CM

## 2022-07-03 DIAGNOSIS — I10 Essential (primary) hypertension: Secondary | ICD-10-CM

## 2022-07-03 DIAGNOSIS — E1165 Type 2 diabetes mellitus with hyperglycemia: Secondary | ICD-10-CM

## 2022-07-03 NOTE — Chronic Care Management (AMB) (Cosign Needed)
Chronic Care Management   CCM RN Visit Note  07/03/2022 Name: Denise Meadows MRN: [id number] DOB: 01/08/1951  Subjective: Denise Meadows is a 71 y.o. year old female who is a primary care patient of Chevis Pretty, FNP. The patient was referred to the Chronic Care Management team for assistance with care management needs subsequent to provider initiation of CCM services and plan of care.    Today's Visit:  Engaged with patient by telephone for follow up visit.        Goals Addressed             This Visit's Progress    CCM (CHRONIC KIDNEY DISEASE) EXPECTED OUTCOME: MONITOR, SELF-MANAGE AND REDUCE SYMPTOMS OF CHRONIC KIDNEY DISEASE       Current Barriers:  Knowledge Deficits related to Chronic Kidney Disease management Chronic Disease Management support and education needs related to Chronic Kidney Disease  Planned Interventions: Evaluation of current treatment plan related to chronic kidney disease self management and patient's adherence to plan as established by provider      Reviewed prescribed diet low sodium Reviewed medications with patient and discussed importance of compliance    Counseled on the importance of exercise goals with target of 150 minutes per week     Advised patient, providing education and rationale, to monitor blood pressure daily and record, calling PCP for findings outside established parameters    Discussed complications of poorly controlled blood pressure such as heart disease, stroke, circulatory complications, vision complications, kidney impairment, sexual dysfunction    Discussed plans with patient for ongoing care management follow up and provided patient with direct contact information for care management team    Screening for signs and symptoms of depression related to chronic disease state      Reinforced importance of drinking adequate fluids Reinforced importance of overall healthy lifestyle, nutrition  Symptom Management: Take  medications as prescribed   Attend all scheduled provider appointments Call pharmacy for medication refills 3-7 days in advance of running out of medications Attend church or other social activities Perform all self care activities independently  Perform IADL's (shopping, preparing meals, housekeeping, managing finances) independently Call provider office for new concerns or questions  Drink adequate fluids, preferably water Continue exercising Follow low sodium diet- limit fasts food  Follow Up Plan: Telephone follow up appointment with care management team member scheduled for:  09/03/22 at 1045 am       CCM (DIABETES) EXPECTED OUTCOME:  MONITOR, SELF-MANAGE AND REDUCE SYMPTOMS OF DIABETES       Current Barriers:  Knowledge Deficits related to Diabetes management Chronic Disease Management support and education needs related to Diabetes and diet No Advanced Directives in place- has living will Patient reports she checks CBG once per week due to "since [facility] is so good, agreed upon I can check CBG once per week"  pt reports readings are usually 120-130's range, states today CBG is 174 and elevated due to being on prednisone, pt reports she sometimes follows carbohydrate modified diet, pt does exercise Patient reports she has had a cough/ virus and prescribed prednisone and cough syrup, states she does feel better  Planned Interventions: Reviewed medications with patient and discussed importance of medication adherence;        Reviewed prescribed diet with patient carbohydrate modified, gave examples of foods high in carbohydrates; Discussed plans with patient for ongoing care management follow up and provided patient with direct contact information for care management team;      Provided  patient with written educational materials related to hypo and hyperglycemia and importance of correct treatment;       Advised patient, providing education and rationale, to check cbg when you have symptoms  of low or high blood sugar and per MD order  and record        Review of patient status, including review of consultants reports, relevant laboratory and other test results, and medications completed;       Reinforced importance of exercise  Symptom Management: Take medications as prescribed   Attend all scheduled provider appointments Call pharmacy for medication refills 3-7 days in advance of running out of medications Attend church or other social activities Perform all self care activities independently  Perform IADL's (shopping, preparing meals, housekeeping, managing finances) independently Call provider office for new concerns or questions  check blood sugar at prescribed times: per pt report checks once weekly since [facility] is good  check feet daily for cuts, sores or redness enter blood sugar readings and medication or insulin into daily log take the blood sugar log to all doctor visits take the blood sugar meter to all doctor visits trim toenails straight across fill half of plate with vegetables read food labels for fat, fiber, carbohydrates and portion size Continue to exercise Prednisone will elevate your blood sugar  Follow Up Plan: Telephone follow up appointment with care management team member scheduled for: 09/03/22 at 1045 am       CCM (HYPERTENSION) EXPECTED OUTCOME: MONITOR, SELF-MANAGE AND REDUCE SYMPTOMS OF HYPERTENSION       Current Barriers:  Knowledge Deficits related to Hypertension management Chronic Disease Management support and education needs related to Hypertension and diet No Advanced Directives in place- pt reports she has Living will Patient reports she lives with spouse, is independent in all aspects of her care, has all medication and taking as prescribed, checks blood pressure 2-3 x per week and states " readings are are always good" systolic readings 846-659 and diastolic 93-57, pt reports she walks 3-5 times per week, tries to eat healthy as much as  possible and adheres to low sodium diet  Planned Interventions: Evaluation of current treatment plan related to hypertension self management and patient's adherence to plan as established by provider;   Reviewed prescribed diet low sodium, food choices Reviewed medications with patient and discussed importance of compliance;  Counseled on the importance of exercise goals with target of 150 minutes per week Discussed plans with patient for ongoing care management follow up and provided patient with direct contact information for care management team; Advised patient, providing education and rationale, to monitor blood pressure daily and record, calling PCP for findings outside established parameters;  Provided education on prescribed diet low sodium;   Symptom Management: Take medications as prescribed   Attend all scheduled provider appointments Call pharmacy for medication refills 3-7 days in advance of running out of medications Attend church or other social activities Perform all self care activities independently  Perform IADL's (shopping, preparing meals, housekeeping, managing finances) independently Call provider office for new concerns or questions  check blood pressure 3 times per week write blood pressure results in a log or diary take blood pressure log to all doctor appointments call doctor for signs and symptoms of high blood pressure keep all doctor appointments take medications for blood pressure exactly as prescribed eat more whole grains, fruits and vegetables, lean meats and healthy fats Follow low sodium diet- read labels Continue walking 3-5 times per week, keep up  the good work!  Follow Up Plan: Telephone follow up appointment with care management team member scheduled for:  09/03/22 at 1045 am          Plan:Telephone follow up appointment with care management team member scheduled for:  09/03/22 at 1045 am  Jacqlyn Larsen [facility], BSN RN Case Manager [place] [phone]

## 2022-07-03 NOTE — Patient Instructions (Signed)
Please call the care guide team at [phone] if you need to cancel or reschedule your appointment.   If you are experiencing a Mental Health or [place] or need someone to talk to, please call the Suicide and Crisis Lifeline: 988 call the [place] National Suicide Prevention Lifeline: 2198759621 or TTY: 908-675-6535 TTY (501) 706-0127) to talk to a trained counselor call 1-800-273-TALK (toll free, 24 hour hotline) go to [facility] [address] [place] [phone]) call the [facility]: [phone] call 911   Following is a copy of the CCM Program Consent:  CCM service includes personalized support from designated clinical staff supervised by the physician, including individualized plan of care and coordination with other care providers 24/7 contact phone numbers for assistance for urgent and routine care needs. Service will only be billed when office clinical staff spend 20 minutes or more in a month to coordinate care. Only one practitioner may furnish and bill the service in a calendar month. The patient may stop CCM services at amy time (effective at the end of the month) by phone call to the office staff. The patient will be responsible for cost sharing (co-pay) or up to 20% of the service fee (after annual deductible is met)  Following is a copy of your full provider care plan:   Goals Addressed             This Visit's Progress    CCM (CHRONIC KIDNEY DISEASE) EXPECTED OUTCOME: MONITOR, SELF-MANAGE AND REDUCE SYMPTOMS OF CHRONIC KIDNEY DISEASE       Current Barriers:  Knowledge Deficits related to Chronic Kidney Disease management Chronic Disease Management support and education needs related to Chronic Kidney Disease  Planned Interventions: Evaluation of current treatment plan related to chronic kidney disease self management and patient's adherence to plan as established by provider      Reviewed  prescribed diet low sodium Reviewed medications with patient and discussed importance of compliance    Counseled on the importance of exercise goals with target of 150 minutes per week     Advised patient, providing education and rationale, to monitor blood pressure daily and record, calling PCP for findings outside established parameters    Discussed complications of poorly controlled blood pressure such as heart disease, stroke, circulatory complications, vision complications, kidney impairment, sexual dysfunction    Discussed plans with patient for ongoing care management follow up and provided patient with direct contact information for care management team    Screening for signs and symptoms of depression related to chronic disease state      Reinforced importance of drinking adequate fluids Reinforced importance of overall healthy lifestyle, nutrition  Symptom Management: Take medications as prescribed   Attend all scheduled provider appointments Call pharmacy for medication refills 3-7 days in advance of running out of medications Attend church or other social activities Perform all self care activities independently  Perform IADL's (shopping, preparing meals, housekeeping, managing finances) independently Call provider office for new concerns or questions  Drink adequate fluids, preferably water Continue exercising Follow low sodium diet- limit fasts food  Follow Up Plan: Telephone follow up appointment with care management team member scheduled for:  09/03/22 at 1045 am       CCM (DIABETES) EXPECTED OUTCOME:  MONITOR, SELF-MANAGE AND REDUCE SYMPTOMS OF DIABETES       Current Barriers:  Knowledge Deficits related to Diabetes management Chronic Disease Management support and education needs related to Diabetes and diet No Advanced  Directives in place- has living will Patient reports she checks CBG once per week due to "since [facility] is so good, agreed upon I can check CBG once per  week"  pt reports readings are usually 120-130's range, states today CBG is 174 and elevated due to being on prednisone, pt reports she sometimes follows carbohydrate modified diet, pt does exercise Patient reports she has had a cough/ virus and prescribed prednisone and cough syrup, states she does feel better  Planned Interventions: Reviewed medications with patient and discussed importance of medication adherence;        Reviewed prescribed diet with patient carbohydrate modified, gave examples of foods high in carbohydrates; Discussed plans with patient for ongoing care management follow up and provided patient with direct contact information for care management team;      Provided patient with written educational materials related to hypo and hyperglycemia and importance of correct treatment;       Advised patient, providing education and rationale, to check cbg when you have symptoms of low or high blood sugar and per MD order  and record        Review of patient status, including review of consultants reports, relevant laboratory and other test results, and medications completed;       Reinforced importance of exercise  Symptom Management: Take medications as prescribed   Attend all scheduled provider appointments Call pharmacy for medication refills 3-7 days in advance of running out of medications Attend church or other social activities Perform all self care activities independently  Perform IADL's (shopping, preparing meals, housekeeping, managing finances) independently Call provider office for new concerns or questions  check blood sugar at prescribed times: per pt report checks once weekly since [facility] is good  check feet daily for cuts, sores or redness enter blood sugar readings and medication or insulin into daily log take the blood sugar log to all doctor visits take the blood sugar meter to all doctor visits trim toenails straight across fill half of plate with  vegetables read food labels for fat, fiber, carbohydrates and portion size Continue to exercise Prednisone will elevate your blood sugar  Follow Up Plan: Telephone follow up appointment with care management team member scheduled for: 09/03/22 at 1045 am       CCM (HYPERTENSION) EXPECTED OUTCOME: MONITOR, SELF-MANAGE AND REDUCE SYMPTOMS OF HYPERTENSION       Current Barriers:  Knowledge Deficits related to Hypertension management Chronic Disease Management support and education needs related to Hypertension and diet No Advanced Directives in place- pt reports she has Living will Patient reports she lives with spouse, is independent in all aspects of her care, has all medication and taking as prescribed, checks blood pressure 2-3 x per week and states " readings are are always good" systolic readings 628-315 and diastolic 17-61, pt reports she walks 3-5 times per week, tries to eat healthy as much as possible and adheres to low sodium diet  Planned Interventions: Evaluation of current treatment plan related to hypertension self management and patient's adherence to plan as established by provider;   Reviewed prescribed diet low sodium, food choices Reviewed medications with patient and discussed importance of compliance;  Counseled on the importance of exercise goals with target of 150 minutes per week Discussed plans with patient for ongoing care management follow up and provided patient with direct contact information for care management team; Advised patient, providing education and rationale, to monitor blood pressure daily and record, calling PCP for findings outside established  parameters;  Provided education on prescribed diet low sodium;   Symptom Management: Take medications as prescribed   Attend all scheduled provider appointments Call pharmacy for medication refills 3-7 days in advance of running out of medications Attend church or other social activities Perform all self care  activities independently  Perform IADL's (shopping, preparing meals, housekeeping, managing finances) independently Call provider office for new concerns or questions  check blood pressure 3 times per week write blood pressure results in a log or diary take blood pressure log to all doctor appointments call doctor for signs and symptoms of high blood pressure keep all doctor appointments take medications for blood pressure exactly as prescribed eat more whole grains, fruits and vegetables, lean meats and healthy fats Follow low sodium diet- read labels Continue walking 3-5 times per week, keep up the good work!  Follow Up Plan: Telephone follow up appointment with care management team member scheduled for:  09/03/22 at 1045 am          Patient verbalizes understanding of instructions and care plan provided today and agrees to view in [place]. Active MyChart status and patient understanding of how to access instructions and care plan via MyChart confirmed with patient.     Telephone follow up appointment with care management team member scheduled for:  09/03/22 at 1045 am

## 2022-07-05 DIAGNOSIS — E1165 Type 2 diabetes mellitus with hyperglycemia: Secondary | ICD-10-CM

## 2022-07-05 DIAGNOSIS — N1831 Chronic kidney disease, stage 3a: Secondary | ICD-10-CM

## 2022-07-05 DIAGNOSIS — I10 Essential (primary) hypertension: Secondary | ICD-10-CM

## 2022-07-09 ENCOUNTER — Telehealth: Payer: Self-pay | Admitting: Nurse Practitioner

## 2022-07-09 NOTE — Telephone Encounter (Signed)
Contacted pharmacy and they said that they had an allergy pop up on their end. I advised that patient had been taking medication over a year and haven't had any problem. Pharmacist stated that they will ship rx our to patient. Called patient and left detailed message on patients voicemail

## 2022-07-29 DIAGNOSIS — K219 Gastro-esophageal reflux disease without esophagitis: Secondary | ICD-10-CM | POA: Diagnosis not present

## 2022-07-29 DIAGNOSIS — K269 Duodenal ulcer, unspecified as acute or chronic, without hemorrhage or perforation: Secondary | ICD-10-CM | POA: Diagnosis not present

## 2022-09-02 DIAGNOSIS — R1013 Epigastric pain: Secondary | ICD-10-CM | POA: Diagnosis not present

## 2022-09-02 DIAGNOSIS — Z1211 Encounter for screening for malignant neoplasm of colon: Secondary | ICD-10-CM | POA: Diagnosis not present

## 2022-09-02 DIAGNOSIS — K5730 Diverticulosis of large intestine without perforation or abscess without bleeding: Secondary | ICD-10-CM | POA: Diagnosis not present

## 2022-09-02 DIAGNOSIS — K449 Diaphragmatic hernia without obstruction or gangrene: Secondary | ICD-10-CM | POA: Diagnosis not present

## 2022-09-02 DIAGNOSIS — K222 Esophageal obstruction: Secondary | ICD-10-CM | POA: Diagnosis not present

## 2022-09-02 DIAGNOSIS — K2970 Gastritis, unspecified, without bleeding: Secondary | ICD-10-CM | POA: Diagnosis not present

## 2022-09-02 DIAGNOSIS — K2930 Chronic superficial gastritis without bleeding: Secondary | ICD-10-CM | POA: Diagnosis not present

## 2022-09-02 DIAGNOSIS — K644 Residual hemorrhoidal skin tags: Secondary | ICD-10-CM | POA: Diagnosis not present

## 2022-09-03 ENCOUNTER — Telehealth: Payer: Self-pay | Admitting: *Deleted

## 2022-09-03 ENCOUNTER — Telehealth: Payer: Medicare Other

## 2022-09-03 ENCOUNTER — Encounter: Payer: Self-pay | Admitting: Radiology

## 2022-09-03 NOTE — Telephone Encounter (Signed)
   CCM RN Visit Note   09/03/22 Name: Denise Meadows MRN: [id number]      DOB: 1950/08/12  Subjective: Denise Meadows is a 72 y.o. year old female who is a primary care patient of Chevis Pretty FNP. The patient was referred to the Chronic Care Management team for assistance with care management needs subsequent to provider initiation of CCM services and plan of care.      An unsuccessful telephone outreach was attempted today to contact the patient about Chronic Care Management needs.    Plan:Telephone follow up appointment with care management team member scheduled for:  upon care guide rescheduling.  Jacqlyn Larsen RNC, BSN RN Case Manager [place] [phone]

## 2022-09-04 ENCOUNTER — Other Ambulatory Visit: Payer: Self-pay | Admitting: Nephrology

## 2022-09-04 DIAGNOSIS — N1831 Chronic kidney disease, stage 3a: Secondary | ICD-10-CM | POA: Diagnosis not present

## 2022-09-04 DIAGNOSIS — N2581 Secondary hyperparathyroidism of renal origin: Secondary | ICD-10-CM

## 2022-09-04 DIAGNOSIS — I129 Hypertensive chronic kidney disease with stage 1 through stage 4 chronic kidney disease, or unspecified chronic kidney disease: Secondary | ICD-10-CM

## 2022-09-04 DIAGNOSIS — K2930 Chronic superficial gastritis without bleeding: Secondary | ICD-10-CM | POA: Diagnosis not present

## 2022-09-04 DIAGNOSIS — D631 Anemia in chronic kidney disease: Secondary | ICD-10-CM

## 2022-09-04 DIAGNOSIS — N189 Chronic kidney disease, unspecified: Secondary | ICD-10-CM | POA: Diagnosis not present

## 2022-09-04 DIAGNOSIS — E1122 Type 2 diabetes mellitus with diabetic chronic kidney disease: Secondary | ICD-10-CM | POA: Diagnosis not present

## 2022-09-10 LAB — LAB REPORT - SCANNED
Creatinine, POC: 87.7 mg/dL
EGFR: 54
Microalb Creat Ratio: 135

## 2022-09-13 ENCOUNTER — Other Ambulatory Visit: Payer: Self-pay | Admitting: Nurse Practitioner

## 2022-09-13 DIAGNOSIS — E1165 Type 2 diabetes mellitus with hyperglycemia: Secondary | ICD-10-CM

## 2022-09-13 DIAGNOSIS — K219 Gastro-esophageal reflux disease without esophagitis: Secondary | ICD-10-CM

## 2022-09-22 ENCOUNTER — Ambulatory Visit (INDEPENDENT_AMBULATORY_CARE_PROVIDER_SITE_OTHER): Payer: Medicare Other

## 2022-09-22 ENCOUNTER — Encounter: Payer: Self-pay | Admitting: Nurse Practitioner

## 2022-09-22 ENCOUNTER — Telehealth: Payer: Self-pay | Admitting: Nurse Practitioner

## 2022-09-22 ENCOUNTER — Ambulatory Visit (INDEPENDENT_AMBULATORY_CARE_PROVIDER_SITE_OTHER): Payer: Medicare Other | Admitting: Nurse Practitioner

## 2022-09-22 VITALS — BP 124/75 | HR 97 | Resp 20 | Ht 61.2 in | Wt 156.4 lb

## 2022-09-22 DIAGNOSIS — M810 Age-related osteoporosis without current pathological fracture: Secondary | ICD-10-CM | POA: Diagnosis not present

## 2022-09-22 DIAGNOSIS — E1169 Type 2 diabetes mellitus with other specified complication: Secondary | ICD-10-CM

## 2022-09-22 DIAGNOSIS — I700 Atherosclerosis of aorta: Secondary | ICD-10-CM

## 2022-09-22 DIAGNOSIS — E1165 Type 2 diabetes mellitus with hyperglycemia: Secondary | ICD-10-CM

## 2022-09-22 DIAGNOSIS — E782 Mixed hyperlipidemia: Secondary | ICD-10-CM

## 2022-09-22 DIAGNOSIS — J8410 Pulmonary fibrosis, unspecified: Secondary | ICD-10-CM | POA: Diagnosis not present

## 2022-09-22 DIAGNOSIS — K219 Gastro-esophageal reflux disease without esophagitis: Secondary | ICD-10-CM | POA: Diagnosis not present

## 2022-09-22 DIAGNOSIS — Z0001 Encounter for general adult medical examination with abnormal findings: Secondary | ICD-10-CM | POA: Diagnosis not present

## 2022-09-22 DIAGNOSIS — E785 Hyperlipidemia, unspecified: Secondary | ICD-10-CM

## 2022-09-22 DIAGNOSIS — G4700 Insomnia, unspecified: Secondary | ICD-10-CM

## 2022-09-22 DIAGNOSIS — E559 Vitamin D deficiency, unspecified: Secondary | ICD-10-CM | POA: Diagnosis not present

## 2022-09-22 DIAGNOSIS — F410 Panic disorder [episodic paroxysmal anxiety] without agoraphobia: Secondary | ICD-10-CM

## 2022-09-22 DIAGNOSIS — I10 Essential (primary) hypertension: Secondary | ICD-10-CM

## 2022-09-22 DIAGNOSIS — Z6830 Body mass index (BMI) 30.0-30.9, adult: Secondary | ICD-10-CM | POA: Insufficient documentation

## 2022-09-22 DIAGNOSIS — Z0000 Encounter for general adult medical examination without abnormal findings: Secondary | ICD-10-CM

## 2022-09-22 DIAGNOSIS — K449 Diaphragmatic hernia without obstruction or gangrene: Secondary | ICD-10-CM | POA: Diagnosis not present

## 2022-09-22 DIAGNOSIS — N1831 Chronic kidney disease, stage 3a: Secondary | ICD-10-CM | POA: Diagnosis not present

## 2022-09-22 LAB — BAYER DCA HB A1C WAIVED: HB A1C (BAYER DCA - WAIVED): 6.9 % — ABNORMAL HIGH (ref 4.8–5.6)

## 2022-09-22 MED ORDER — DIAZEPAM 2 MG PO TABS: 2.0000 mg | ORAL_TABLET | Freq: Four times a day (QID) | ORAL | 0 refills | Status: DC | PRN

## 2022-09-22 MED ORDER — SIMVASTATIN 20 MG PO TABS: 20.0000 mg | ORAL_TABLET | Freq: Every day | ORAL | 1 refills | Status: DC

## 2022-09-22 MED ORDER — RAMIPRIL 10 MG PO CAPS: 10.0000 mg | ORAL_CAPSULE | Freq: Every day | ORAL | 1 refills | Status: DC

## 2022-09-22 MED ORDER — DAPAGLIFLOZIN PROPANEDIOL 10 MG PO TABS: 10.0000 mg | ORAL_TABLET | Freq: Every day | ORAL | 3 refills | Status: DC

## 2022-09-22 MED ORDER — OMEPRAZOLE 20 MG PO CPDR: 20.0000 mg | DELAYED_RELEASE_CAPSULE | Freq: Every day | ORAL | 0 refills | Status: DC | PRN

## 2022-09-22 MED ORDER — METOPROLOL SUCCINATE ER 25 MG PO TB24: 25.0000 mg | ORAL_TABLET | Freq: Every day | ORAL | 1 refills | Status: DC

## 2022-09-22 NOTE — Addendum Note (Signed)
Addended by: Rolena Infante on: 09/22/2022 09:09 AM   Modules accepted: Orders

## 2022-09-22 NOTE — Progress Notes (Signed)
Subjective:    Patient ID: Denise Meadows, female    DOB: 25-Jun-1951, 72 y.o.   MRN: [id number]   Chief Complaint: annual physical   HPI:  Denise Meadows is a 72 y.o. who identifies as a female who was assigned female at birth.   Social history: Lives with: husband Work history: retired   Scientist, forensic in today for follow up of the following chronic medical issues:  1. Annual physical exam  2. Primary hypertension No c/o chest pain, sob or headache. Doe snot check blood pressure at home. BP Readings from Last 3 Encounters:  04/20/22 118/74  03/24/22 105/61  01/14/22 118/72     3. Aortic atherosclerosis (HCC) No issues that she is aware of.  4. Hyperlipidemia associated with type 2 diabetes mellitus ([place]) Does not watch diet and does no exercise. Lab Results  Component Value Date   HGBA1C 6.6 (H) 03/24/2022     5. Type 2 diabetes mellitus with hyperglycemia, without long-term current use of insulin (HCC) Fasting blood sugars are running around120-140. Was on prednisone for a few days and it ran high. Lab Results  Component Value Date   HGBA1C 6.6 (H) 03/24/2022     6. Gastroesophageal reflux disease, unspecified whether esophagitis present Is on omperazole and is doing well.  7. Hiatal hernia Ok as long as she does not over eat.  8. Stage 3a chronic kidney disease (HCC) No voiding issues Lab Results  Component Value Date   CREATININE 1.17 (H) 03/24/2022     9. Insomnia, unspecified type No issues  10. Panic attacks Has valium available as needed. She very seldom takes.    09/22/2022    8:12 AM 04/20/2022    9:02 AM 01/14/2022    3:48 PM 09/11/2021    8:07 AM  GAD 7 : Generalized Anxiety Score  Nervous, Anxious, on Edge 0 0 0 0  Control/stop worrying 0 0 0 0  Worry too much - different things 0 0 0 0  Trouble relaxing 0 0 0 0  Restless 0 0 0 0  Easily annoyed or irritable 0 0 0 0  Afraid - awful might happen 0 0 0 0  Total GAD 7 Score 0 0 0 0   Anxiety Difficulty Not difficult at all Not difficult at all Not difficult at all Not difficult at all      11. Vitamin D deficiency Daily supplement  12. Age-related osteoporosis without current pathological fracture Last dexascan was done on 01/29/21. T score was -2.7. doie sno weight bearing exercises.  13. BMI 30.0-30.9,adult Weight is down 3 lbs Wt Readings from Last 3 Encounters:  09/22/22 156 lb 6.4 oz (70.9 kg)  04/20/22 159 lb (72.1 kg)  03/24/22 158 lb (71.7 kg)   BMI Readings from Last 3 Encounters:  09/22/22 29.36 kg/m  04/20/22 30.04 kg/m  03/24/22 29.85 kg/m     New complaints: None today  Allergies  Allergen Reactions   Acetaminophen Nausea And Vomiting   Codeine Nausea And Vomiting   Penicillins Rash   Outpatient Encounter Medications as of 09/22/2022  Medication Sig   calcium gluconate 500 MG tablet Take 1 tablet (500 mg total) 2 (two) times daily by mouth.   Cholecalciferol (VITAMIN D) 2000 units tablet Take 1 tablet (2,000 Units total) by mouth daily.   dapagliflozin propanediol (FARXIGA) 10 MG TABS tablet Take 1 tablet (10 mg total) by mouth daily before breakfast.   diazepam (VALIUM) 2 MG tablet Take 1 tablet (2  mg total) by mouth every 6 (six) hours as needed for anxiety.   glucose blood test strip Please dispense as OneTouch Ultra 2. Use as directed to monitor FSBS 1x weekly. Dx: E11.9.   HYDROcodone bit-homatropine (HYCODAN) 5-1.5 MG/5ML syrup Take 5 mLs by mouth every 8 (eight) hours as needed for cough.   Lancets (ONETOUCH ULTRASOFT) lancets Use as directed to monitor FSBS 1x weekly. Dx: E11.9.   metFORMIN (GLUCOPHAGE) 1000 MG tablet TAKE 1 TABLET BY MOUTH DAILY  WITH BREAKFAST   methocarbamol (ROBAXIN) 500 MG tablet Take 1 tablet (500 mg total) by mouth every 8 (eight) hours as needed for muscle spasms.   metoprolol succinate (TOPROL-XL) 25 MG 24 hr tablet TAKE 1 TABLET BY MOUTH ONCE  DAILY   metroNIDAZOLE (METROGEL) 0.75 % gel Apply 1  application topically as needed.   Multiple Vitamin (MULTIVITAMIN WITH MINERALS) TABS tablet Take 1 tablet by mouth daily.   Omega-3 Fatty Acids (FISH OIL) 1000 MG CAPS Take 1 capsule (1,000 mg total) by mouth 2 (two) times daily.   omeprazole (PRILOSEC) 20 MG capsule TAKE 1 CAPSULE BY MOUTH DAILY AS NEEDED   ramipril (ALTACE) 10 MG capsule TAKE 1 CAPSULE BY MOUTH DAILY   simvastatin (ZOCOR) 20 MG tablet TAKE 1 TABLET BY MOUTH AT  BEDTIME   vitamin B-12 (CYANOCOBALAMIN) 100 MCG tablet Take 1 tablet (100 mcg total) by mouth daily.   No facility-administered encounter medications on file as of 09/22/2022.    Past Surgical History:  Procedure Laterality Date   BREAST CYST ASPIRATION Left 2005   BREAST SURGERY     benign breast nodules 2005   CHOLECYSTECTOMY     COLONOSCOPY  2008   Dr Medoff-Normal   DILATION AND CURETTAGE, DIAGNOSTIC / THERAPEUTIC     ESOPHAGOGASTRODUODENOSCOPY     FRACTURE SURGERY     MALONEY DILATION  02/08/2012   Procedure: MALONEY DILATION;  Surgeon: Danie Binder, MD;  Location: [facility];  Service: Endoscopy;  Laterality: N/A;   SAVORY DILATION  02/08/2012   Procedure: SAVORY DILATION;  Surgeon: Danie Binder, MD;  Location: [facility];  Service: Endoscopy;  Laterality: N/A;   SPINE SURGERY      Family History  Problem Relation Age of Onset   Hyperlipidemia Mother    Osteoarthritis Mother    COPD Father    Colon cancer Maternal Aunt    Colon cancer Maternal Grandfather    Scoliosis Son    Heart disease Neg Hx    Breast cancer Neg Hx       Controlled substance contract: n/a     Review of Systems  Constitutional:  Negative for diaphoresis.  Eyes:  Negative for pain.  Respiratory:  Negative for shortness of breath.   Cardiovascular:  Negative for chest pain, palpitations and leg swelling.  Gastrointestinal:  Negative for abdominal pain.  Endocrine: Negative for polydipsia.  Skin:  Negative for rash.  Neurological:  Negative for  dizziness, weakness and headaches.  Hematological:  Does not bruise/bleed easily.  All other systems reviewed and are negative.      Objective:   Physical Exam Vitals and nursing note reviewed.  Constitutional:      General: She is not in acute distress.    Appearance: Normal appearance. She is well-developed.  HENT:     Head: Normocephalic.     Right Ear: Tympanic membrane normal.     Left Ear: Tympanic membrane normal.     Nose: Nose normal.  Mouth/Throat:     Mouth: Mucous membranes are moist.  Eyes:     Pupils: Pupils are equal, round, and reactive to light.  Neck:     Vascular: No carotid bruit or JVD.  Cardiovascular:     Rate and Rhythm: Normal rate and regular rhythm.     Heart sounds: Normal heart sounds.  Pulmonary:     Effort: Pulmonary effort is normal. No respiratory distress.     Breath sounds: Normal breath sounds. No wheezing or rales.  Chest:     Chest wall: No tenderness.  Abdominal:     General: Bowel sounds are normal. There is no distension or abdominal bruit.     Palpations: Abdomen is soft. There is no hepatomegaly, splenomegaly, mass or pulsatile mass.     Tenderness: There is no abdominal tenderness.  Musculoskeletal:        General: Normal range of motion.     Cervical back: Normal range of motion and neck supple.  Lymphadenopathy:     Cervical: No cervical adenopathy.  Skin:    General: Skin is warm and dry.  Neurological:     Mental Status: She is alert and oriented to person, place, and time.     Deep Tendon Reflexes: Reflexes are normal and symmetric.  Psychiatric:        Behavior: Behavior normal.        Thought Content: Thought content normal.        Judgment: Judgment normal.    BP 124/75   Pulse 97   Resp 20   Ht 5' 1.2" (1.554 m)   Wt 156 lb 6.4 oz (70.9 kg)   SpO2 (!) 80%   BMI 29.36 kg/m   Chest xray- no acute or chronic findings  EKG- NSR- [name], FNP  HGBA1c 6.9%     Assessment & Plan:   Denise Meadows comes in today with chief complaint of Medical Management of Chronic Issues   Diagnosis and orders addressed:  1. Annual physical exam   2. Primary hypertension Low sodium diet - DG Chest 2 View - CBC with Differential/Platelet - CMP14+EGFR - metoprolol succinate (TOPROL-XL) 25 MG 24 hr tablet; Take 1 tablet (25 mg total) by mouth daily.  Dispense: 90 tablet; Refill: 1 - ramipril (ALTACE) 10 MG capsule; Take 1 capsule (10 mg total) by mouth daily.  Dispense: 90 capsule; Refill: 1  3. Aortic atherosclerosis (HCC) - DG Chest 2 View  4. Hyperlipidemia associated with type 2 diabetes mellitus (HCC) Low fat diet- simvastatin (ZOCOR) 20 MG tablet; Take 1 tablet (20 mg total) by mouth at bedtime.  Dispense: 90 tablet; Refill: 1  - Lipid panel  5. Type 2 diabetes mellitus with hyperglycemia, without long-term current use of insulin (HCC) Continue to watch carbs in diet - Bayer DCA Hb A1c Waived - dapagliflozin propanediol (FARXIGA) 10 MG TABS tablet; Take 1 tablet (10 mg total) by mouth daily before breakfast.  Dispense: 90 tablet; Refill: 3  6. Gastroesophageal reflux disease, unspecified whether esophagitis present Avoid spicy foods Do not eat 2 hours prior to bedtime  - omeprazole (PRILOSEC) 20 MG capsule; Take 1 capsule (20 mg total) by mouth daily as needed.  Dispense: 100 capsule; Refill: 0  7. Hiatal hernia Do not over eat  8. Stage 3a chronic kidney disease ([place]) Labs pending - dapagliflozin propanediol (FARXIGA) 10 MG TABS tablet; Take 1 tablet (10 mg total) by mouth daily before breakfast.  Dispense: 90 tablet; Refill: 3  9.  Insomnia, unspecified type Bedtime routine  10. Panic attacks Stress management  11. Vitamin D deficiency - VITAMIN D 25 Hydroxy (Vit-D Deficiency, Fractures)  12. Age-related osteoporosis without current pathological fracture Weight bearing exercises  13. BMI 30.0-30.9,adult Discussed diet and exercise for person with BMI  >25 Will recheck weight in 3-6 months    14. Panic anxiety syndrome - diazepam (VALIUM) 2 MG tablet; Take 1 tablet (2 mg total) by mouth every 6 (six) hours as needed for anxiety.  Dispense: 20 tablet; Refill: 0   Labs pending Health Maintenance reviewed Diet and exercise encouraged  Follow up plan: 6 months   [name], FNP

## 2022-09-22 NOTE — Telephone Encounter (Signed)
Cancelled Farxiga at mail order pharmacy. Left message on patients voicemail that this was done

## 2022-09-23 LAB — CBC WITH DIFFERENTIAL/PLATELET
Basophils Absolute: 0.1 10*3/uL (ref 0.0–0.2)
Basos: 1 %
EOS (ABSOLUTE): 0.2 10*3/uL (ref 0.0–0.4)
Eos: 3 %
Hematocrit: 41.2 % (ref 34.0–46.6)
Hemoglobin: 13.7 g/dL (ref 11.1–15.9)
Immature Grans (Abs): 0 10*3/uL (ref 0.0–0.1)
Immature Granulocytes: 0 %
Lymphocytes Absolute: 2.2 10*3/uL (ref 0.7–3.1)
Lymphs: 32 %
MCH: 31 pg (ref 26.6–33.0)
MCHC: 33.3 g/dL (ref 31.5–35.7)
MCV: 93 fL (ref 79–97)
Monocytes Absolute: 0.7 10*3/uL (ref 0.1–0.9)
Monocytes: 10 %
Neutrophils Absolute: 3.7 10*3/uL (ref 1.4–7.0)
Neutrophils: 54 %
Platelets: 330 10*3/uL (ref 150–450)
RBC: 4.42 x10E6/uL (ref 3.77–5.28)
RDW: 12.5 % (ref 11.7–15.4)
WBC: 6.9 10*3/uL (ref 3.4–10.8)

## 2022-09-23 LAB — CMP14+EGFR
ALT: 13 IU/L (ref 0–32)
AST: 19 IU/L (ref 0–40)
Albumin/Globulin Ratio: 1.6 (ref 1.2–2.2)
Albumin: 4.2 g/dL (ref 3.8–4.8)
Alkaline Phosphatase: 75 IU/L (ref 44–121)
BUN/Creatinine Ratio: 22 (ref 12–28)
BUN: 24 mg/dL (ref 8–27)
Bilirubin Total: 0.4 mg/dL (ref 0.0–1.2)
CO2: 21 mmol/L (ref 20–29)
Calcium: 10.1 mg/dL (ref 8.7–10.3)
Chloride: 101 mmol/L (ref 96–106)
Creatinine, Ser: 1.1 mg/dL — ABNORMAL HIGH (ref 0.57–1.00)
Globulin, Total: 2.7 g/dL (ref 1.5–4.5)
Glucose: 148 mg/dL — ABNORMAL HIGH (ref 70–99)
Potassium: 4.4 mmol/L (ref 3.5–5.2)
Sodium: 140 mmol/L (ref 134–144)
Total Protein: 6.9 g/dL (ref 6.0–8.5)
eGFR: 54 mL/min/{1.73_m2} — ABNORMAL LOW (ref >59)

## 2022-09-23 LAB — LIPID PANEL
Chol/HDL Ratio: 3.2 ratio (ref 0.0–4.4)
Cholesterol, Total: 166 mg/dL (ref 100–199)
HDL: 52 mg/dL (ref >39)
LDL Chol Calc (NIH): 82 mg/dL (ref 0–99)
Triglycerides: 192 mg/dL — ABNORMAL HIGH (ref 0–149)
VLDL Cholesterol Cal: 32 mg/dL (ref 5–40)

## 2022-09-23 LAB — VITAMIN D 25 HYDROXY (VIT D DEFICIENCY, FRACTURES): Vit D, 25-Hydroxy: 55.7 ng/mL (ref 30.0–100.0)

## 2022-09-24 ENCOUNTER — Other Ambulatory Visit: Payer: Medicare Other

## 2022-10-01 ENCOUNTER — Telehealth: Payer: Self-pay

## 2022-10-01 ENCOUNTER — Ambulatory Visit
Admission: RE | Admit: 2022-10-01 | Discharge: 2022-10-01 | Disposition: A | Discharge status: Home / Self Care | Payer: Medicare Other | Source: Ambulatory Visit | Attending: Nephrology | Admitting: Nephrology

## 2022-10-01 DIAGNOSIS — I129 Hypertensive chronic kidney disease with stage 1 through stage 4 chronic kidney disease, or unspecified chronic kidney disease: Secondary | ICD-10-CM

## 2022-10-01 DIAGNOSIS — D631 Anemia in chronic kidney disease: Secondary | ICD-10-CM

## 2022-10-01 DIAGNOSIS — N2581 Secondary hyperparathyroidism of renal origin: Secondary | ICD-10-CM

## 2022-10-01 DIAGNOSIS — N189 Chronic kidney disease, unspecified: Secondary | ICD-10-CM | POA: Diagnosis not present

## 2022-10-01 DIAGNOSIS — N1831 Chronic kidney disease, stage 3a: Secondary | ICD-10-CM

## 2022-10-01 NOTE — Progress Notes (Signed)
  Chronic Care Management Note  10/01/2022 Name: [name] MRN: [id number] DOB: 1951/07/06  Denise Meadows is a 72 y.o. year old female who is a primary care patient of Chevis Pretty, FNP and is actively engaged with the Chronic Care Management team. I reached out to [name] by phone today to assist with re-scheduling a follow up visit with the RN Case Manager  Follow up plan: Unsuccessful telephone outreach attempt made. A HIPAA compliant phone message was left for the patient providing contact information and requesting a return call.  The care management team will reach out to the patient again over the next 7 days.  If patient returns call to provider office, please advise to call [facility]  at [place], [place], [place] [address] Direct Dial: [phone] [email or web address]

## 2022-10-13 NOTE — Progress Notes (Signed)
  Chronic Care Management Note  10/13/2022 Name: LASHAINA TOMER MRN: [id number] DOB: 1951-03-03  Denise Meadows is a 72 y.o. year old female who is a primary care patient of Bennie Pierini, FNP and is actively engaged with the Chronic Care Management team. I reached out to Vikki Ports by phone today to assist with re-scheduling a follow up visit with the RN Case Manager  Follow up plan: Unsuccessful telephone outreach attempt made. A HIPAA compliant phone message was left for the patient providing contact information and requesting a return call.  The care management team will reach out to the patient again over the next 7 days.  If patient returns call to provider office, please advise to call CCM Care Guide Penne Lash  at [phone]  Penne Lash, RMA Care Guide [facility]  [place], [place] [address] Direct Dial: [phone] [email or web address]

## 2022-10-20 ENCOUNTER — Ambulatory Visit (INDEPENDENT_AMBULATORY_CARE_PROVIDER_SITE_OTHER): Payer: Medicare Other | Admitting: *Deleted

## 2022-10-20 DIAGNOSIS — N1831 Chronic kidney disease, stage 3a: Secondary | ICD-10-CM

## 2022-10-20 DIAGNOSIS — E1165 Type 2 diabetes mellitus with hyperglycemia: Secondary | ICD-10-CM

## 2022-10-20 DIAGNOSIS — I10 Essential (primary) hypertension: Secondary | ICD-10-CM

## 2022-10-20 NOTE — Progress Notes (Signed)
  Chronic Care Management Note  10/20/2022 Name: Denise Meadows MRN: [id number] DOB: 11/16/1950  Denise Meadows is a 72 y.o. year old female who is a primary care patient of Bennie Pierini, FNP and is actively engaged with the Chronic Care Management team. I reached out to Vikki Ports by phone today to assist with re-scheduling a follow up visit with the RN Case Manager  Follow up plan: Unable to make contact on outreach attempts x 3. PCP Bennie Pierini, FNP notified via routed documentation in medical record.   Penne Lash, RMA Care Guide [facility]  [place], [place] [address] Direct Dial: [phone] [email or web address]

## 2022-10-20 NOTE — Chronic Care Management (AMB) (Signed)
Chronic Care Management   CCM RN Visit Note  10/20/2022 Name: Denise Meadows MRN: [id number] DOB: Dec 08, 1950  Subjective: Denise Meadows is a 72 y.o. year old female who is a primary care patient of Bennie Pierini, FNP. The patient was referred to the Chronic Care Management team for assistance with care management needs subsequent to provider initiation of CCM services and plan of care.    Case closure today- care plan updated and completed due to unable to maintain contact.      Goals Addressed             This Visit's Progress    COMPLETED: CCM (CHRONIC KIDNEY DISEASE) EXPECTED OUTCOME: MONITOR, SELF-MANAGE AND REDUCE SYMPTOMS OF CHRONIC KIDNEY DISEASE       Message received from care guide, unable to reach for rescheduling, unable to maintain contact, care plan updated and completed Current Barriers:  Knowledge Deficits related to Chronic Kidney Disease management Chronic Disease Management support and education needs related to Chronic Kidney Disease  Planned Interventions: Evaluation of current treatment plan related to chronic kidney disease self management and patient's adherence to plan as established by provider      Reviewed prescribed diet low sodium Reviewed medications with patient and discussed importance of compliance    Counseled on the importance of exercise goals with target of 150 minutes per week     Advised patient, providing education and rationale, to monitor blood pressure daily and record, calling PCP for findings outside established parameters    Discussed complications of poorly controlled blood pressure such as heart disease, stroke, circulatory complications, vision complications, kidney impairment, sexual dysfunction    Discussed plans with patient for ongoing care management follow up and provided patient with direct contact information for care management team    Screening for signs and symptoms of depression related to chronic disease state       Reinforced importance of drinking adequate fluids Reinforced importance of overall healthy lifestyle, nutrition  Symptom Management: Take medications as prescribed   Attend all scheduled provider appointments Call pharmacy for medication refills 3-7 days in advance of running out of medications Attend church or other social activities Perform all self care activities independently  Perform IADL's (shopping, preparing meals, housekeeping, managing finances) independently Call provider office for new concerns or questions  Drink adequate fluids, preferably water Continue exercising Follow low sodium diet- limit fasts food  Follow Up Plan: Telephone follow up appointment with care management team member scheduled for:  09/03/22 at 1045 am       COMPLETED: CCM (DIABETES) EXPECTED OUTCOME:  MONITOR, SELF-MANAGE AND REDUCE SYMPTOMS OF DIABETES       Message received from care guide, unable to reach for rescheduling, unable to maintain contact, care plan updated and completed Current Barriers:  Knowledge Deficits related to Diabetes management Chronic Disease Management support and education needs related to Diabetes and diet No Advanced Directives in place- has living will Patient reports she checks CBG once per week due to "since [facility] is so good, agreed upon I can check CBG once per week"  pt reports readings are usually 120-130's range, states today CBG is 174 and elevated due to being on prednisone, pt reports she sometimes follows carbohydrate modified diet, pt does exercise Patient reports she has had a cough/ virus and prescribed prednisone and cough syrup, states she does feel better  Planned Interventions: Reviewed medications with patient and discussed importance of medication adherence;        Reviewed  prescribed diet with patient carbohydrate modified, gave examples of foods high in carbohydrates; Discussed plans with patient for ongoing care management follow up and provided  patient with direct contact information for care management team;      Provided patient with written educational materials related to hypo and hyperglycemia and importance of correct treatment;       Advised patient, providing education and rationale, to check cbg when you have symptoms of low or high blood sugar and per MD order  and record        Review of patient status, including review of consultants reports, relevant laboratory and other test results, and medications completed;       Reinforced importance of exercise  Symptom Management: Take medications as prescribed   Attend all scheduled provider appointments Call pharmacy for medication refills 3-7 days in advance of running out of medications Attend church or other social activities Perform all self care activities independently  Perform IADL's (shopping, preparing meals, housekeeping, managing finances) independently Call provider office for new concerns or questions  check blood sugar at prescribed times: per pt report checks once weekly since [facility] is good  check feet daily for cuts, sores or redness enter blood sugar readings and medication or insulin into daily log take the blood sugar log to all doctor visits take the blood sugar meter to all doctor visits trim toenails straight across fill half of plate with vegetables read food labels for fat, fiber, carbohydrates and portion size Continue to exercise Prednisone will elevate your blood sugar  Follow Up Plan: Telephone follow up appointment with care management team member scheduled for: 09/03/22 at 1045 am       COMPLETED: CCM (HYPERTENSION) EXPECTED OUTCOME: MONITOR, SELF-MANAGE AND REDUCE SYMPTOMS OF HYPERTENSION       Message received from care guide, unable to reach for rescheduling, unable to maintain contact, care plan updated and completed Current Barriers:  Knowledge Deficits related to Hypertension management Chronic Disease Management support and education  needs related to Hypertension and diet No Advanced Directives in place- pt reports she has Living will Patient reports she lives with spouse, is independent in all aspects of her care, has all medication and taking as prescribed, checks blood pressure 2-3 x per week and states " readings are are always good" systolic readings 120-130 and diastolic 60-70, pt reports she walks 3-5 times per week, tries to eat healthy as much as possible and adheres to low sodium diet  Planned Interventions: Evaluation of current treatment plan related to hypertension self management and patient's adherence to plan as established by provider;   Reviewed prescribed diet low sodium, food choices Reviewed medications with patient and discussed importance of compliance;  Counseled on the importance of exercise goals with target of 150 minutes per week Discussed plans with patient for ongoing care management follow up and provided patient with direct contact information for care management team; Advised patient, providing education and rationale, to monitor blood pressure daily and record, calling PCP for findings outside established parameters;  Provided education on prescribed diet low sodium;   Symptom Management: Take medications as prescribed   Attend all scheduled provider appointments Call pharmacy for medication refills 3-7 days in advance of running out of medications Attend church or other social activities Perform all self care activities independently  Perform IADL's (shopping, preparing meals, housekeeping, managing finances) independently Call provider office for new concerns or questions  check blood pressure 3 times per week write blood pressure results in  a log or diary take blood pressure log to all doctor appointments call doctor for signs and symptoms of high blood pressure keep all doctor appointments take medications for blood pressure exactly as prescribed eat more whole grains, fruits and  vegetables, lean meats and healthy fats Follow low sodium diet- read labels Continue walking 3-5 times per week, keep up the good work!  Follow Up Plan: Telephone follow up appointment with care management team member scheduled for:  09/03/22 at 1045 am          Plan:No further follow up required: Case closure  Irving Shows [facility], BSN RN Case Manager [facility] [phone]

## 2022-10-30 ENCOUNTER — Encounter: Payer: Self-pay | Admitting: Pharmacist

## 2022-10-30 ENCOUNTER — Telehealth: Payer: Self-pay | Admitting: Pharmacist

## 2022-10-30 DIAGNOSIS — E1165 Type 2 diabetes mellitus with hyperglycemia: Secondary | ICD-10-CM

## 2022-10-30 DIAGNOSIS — N1831 Chronic kidney disease, stage 3a: Secondary | ICD-10-CM

## 2022-10-30 MED ORDER — DAPAGLIFLOZIN PROPANEDIOL 10 MG PO TABS: 10.0000 mg | ORAL_TABLET | Freq: Every day | ORAL | 5 refills | Status: DC

## 2022-10-30 NOTE — Telephone Encounter (Signed)
    10/30/2022 Name: Denise Meadows MRN: [id number] DOB: 1951-06-30   FARXIGA 10MG  DAILY refills ESCRIBED/sent to [facility] ORDER pharmacy for [place] patient assistance program) #90 W/ REFILLS  WILL DELIVER TO PATIENT'S HOME ADDRESS PATIENT CAN CALL TO CHECK ON DELIVERY AS NEEDED  #[phone]  Patient stable and doing well on current therapy.  A1c 6.9% on 09/22/22, GFR 54  Kieth Brightly, PharmD, BCACP Clinical Pharmacist, [facility]

## 2022-11-03 DIAGNOSIS — Z7984 Long term (current) use of oral hypoglycemic drugs: Secondary | ICD-10-CM

## 2022-11-03 DIAGNOSIS — I129 Hypertensive chronic kidney disease with stage 1 through stage 4 chronic kidney disease, or unspecified chronic kidney disease: Secondary | ICD-10-CM

## 2022-11-03 DIAGNOSIS — N1831 Chronic kidney disease, stage 3a: Secondary | ICD-10-CM | POA: Diagnosis not present

## 2022-11-03 DIAGNOSIS — E1122 Type 2 diabetes mellitus with diabetic chronic kidney disease: Secondary | ICD-10-CM | POA: Diagnosis not present

## 2022-11-30 ENCOUNTER — Other Ambulatory Visit: Payer: Self-pay | Admitting: Nurse Practitioner

## 2022-11-30 DIAGNOSIS — E782 Mixed hyperlipidemia: Secondary | ICD-10-CM

## 2022-11-30 DIAGNOSIS — E1165 Type 2 diabetes mellitus with hyperglycemia: Secondary | ICD-10-CM

## 2022-11-30 DIAGNOSIS — I10 Essential (primary) hypertension: Secondary | ICD-10-CM

## 2022-11-30 DIAGNOSIS — K219 Gastro-esophageal reflux disease without esophagitis: Secondary | ICD-10-CM

## 2022-12-13 IMAGING — DX DG WRIST COMPLETE 3+V*R*
3 series · 3 of 3 positions shown · non-contrast
Comparison: None.

CLINICAL DATA: Right wrist pain, history of injury

EXAM:
RIGHT WRIST - COMPLETE 3+ VIEW

[wrist ap]
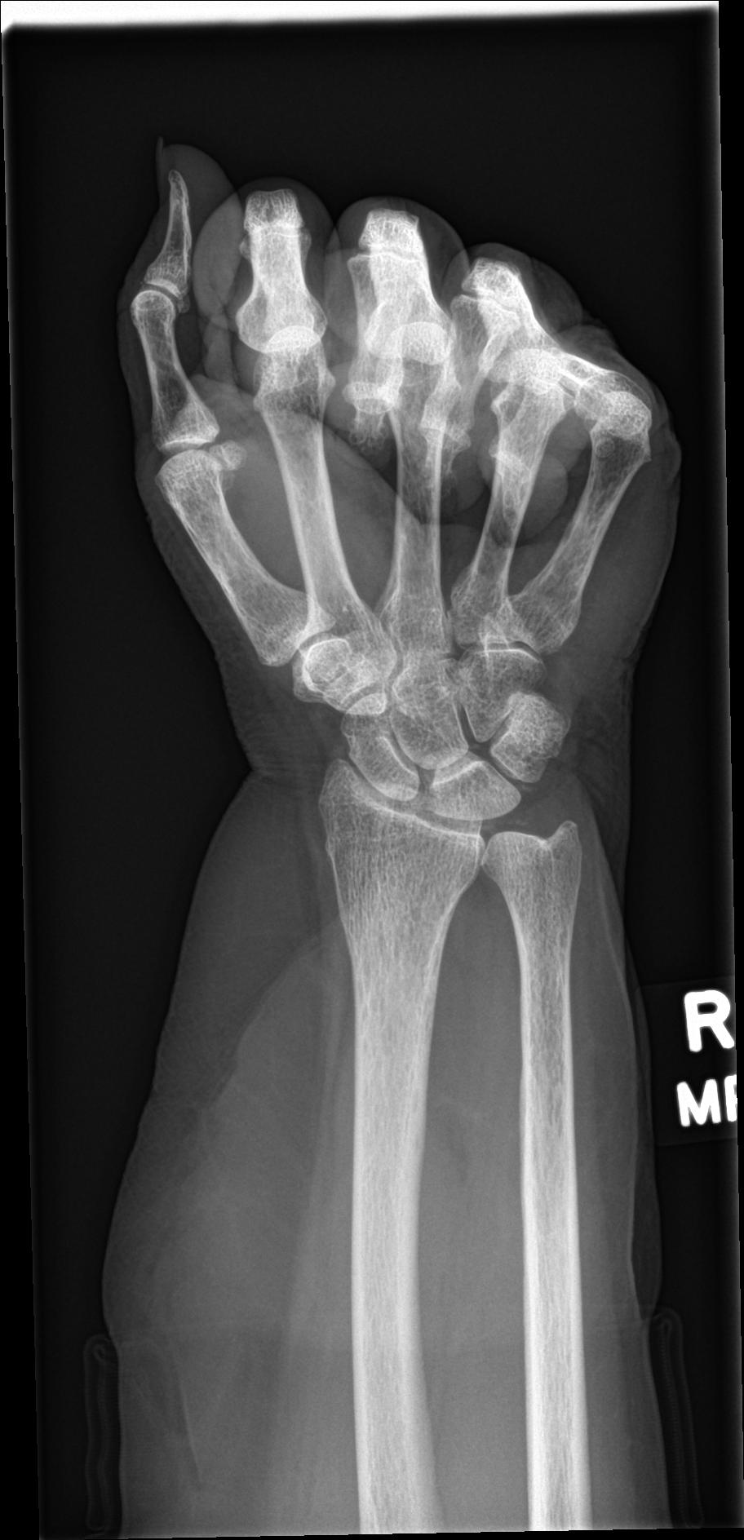

[wrist obl]
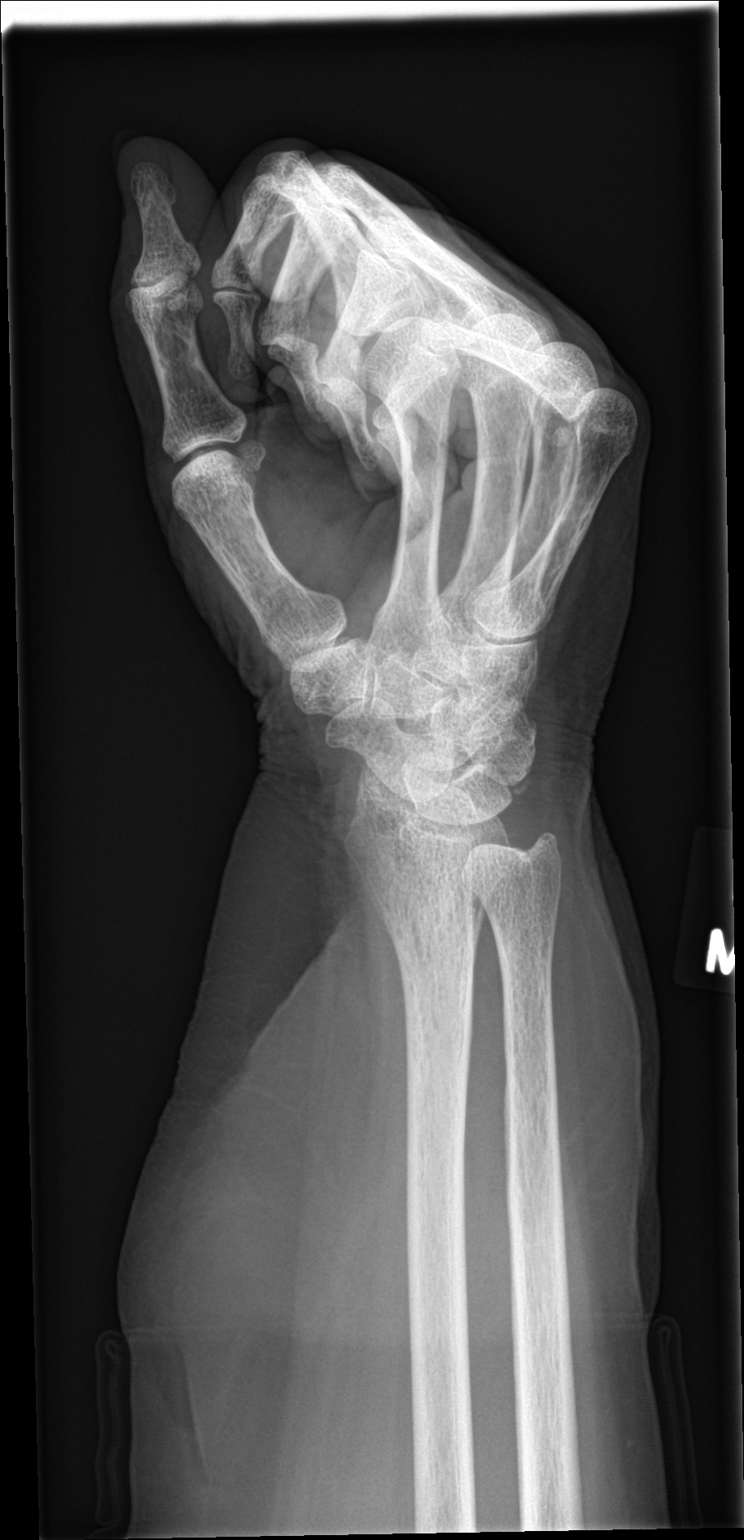

[wrist lat]
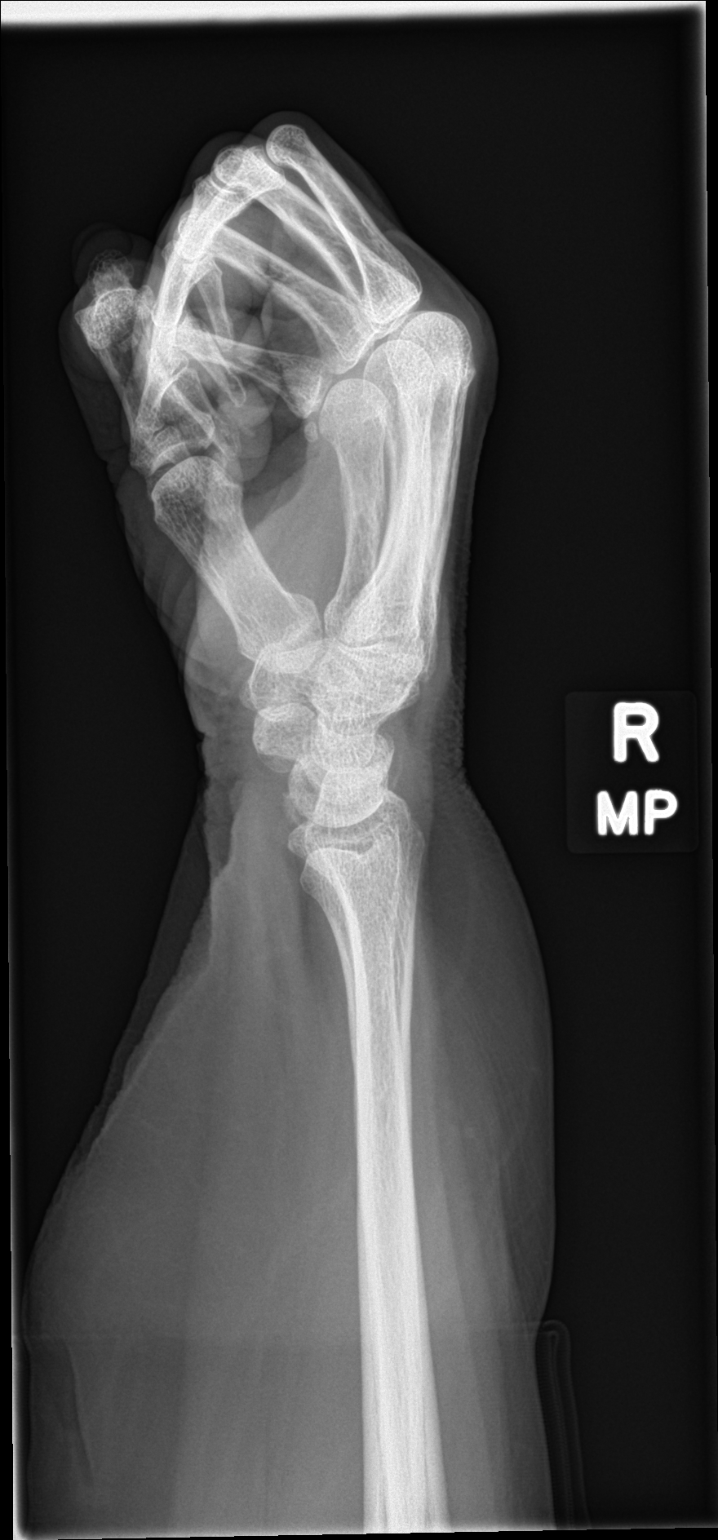

[3 of 3 positions shown; findings below may reference images not displayed]

FINDINGS: There is no evidence of fracture or dislocation. Joint spaces are
well maintained. Mild chondrocalcinosis. Soft tissues are
unremarkable.
IMPRESSION: No acute osseous abnormality.

Chondrocalcinosis, findings can be seen setting of CPPD arthropathy.

## 2022-12-30 DIAGNOSIS — I129 Hypertensive chronic kidney disease with stage 1 through stage 4 chronic kidney disease, or unspecified chronic kidney disease: Secondary | ICD-10-CM | POA: Diagnosis not present

## 2022-12-30 DIAGNOSIS — N2581 Secondary hyperparathyroidism of renal origin: Secondary | ICD-10-CM | POA: Diagnosis not present

## 2022-12-30 DIAGNOSIS — D631 Anemia in chronic kidney disease: Secondary | ICD-10-CM | POA: Diagnosis not present

## 2022-12-30 DIAGNOSIS — N1831 Chronic kidney disease, stage 3a: Secondary | ICD-10-CM | POA: Diagnosis not present

## 2023-01-04 ENCOUNTER — Other Ambulatory Visit: Payer: Self-pay | Admitting: Nurse Practitioner

## 2023-01-04 DIAGNOSIS — Z1231 Encounter for screening mammogram for malignant neoplasm of breast: Secondary | ICD-10-CM

## 2023-02-18 ENCOUNTER — Ambulatory Visit (INDEPENDENT_AMBULATORY_CARE_PROVIDER_SITE_OTHER): Payer: Medicare Other

## 2023-02-18 VITALS — Ht 61.0 in | Wt 158.0 lb

## 2023-02-18 DIAGNOSIS — Z0000 Encounter for general adult medical examination without abnormal findings: Secondary | ICD-10-CM | POA: Diagnosis not present

## 2023-02-18 DIAGNOSIS — Z780 Asymptomatic menopausal state: Secondary | ICD-10-CM | POA: Diagnosis not present

## 2023-02-18 NOTE — Progress Notes (Signed)
Subjective:   Denise Meadows is a 72 y.o. female who presents for Medicare Annual (Subsequent) preventive examination.  Visit Complete: Virtual  I connected with  Denise Meadows on 02/18/23 by a audio enabled telemedicine application and verified that I am speaking with the correct person using two identifiers.  Patient Location: Home  Provider Location: Home Office  I discussed the limitations of evaluation and management by telemedicine. The patient expressed understanding and agreed to proceed.  Patient Medicare AWV questionnaire was completed by the patient on 0/15/2024; I have confirmed that all information answered by patient is correct and no changes since this date.  Review of Systems    Vital Signs: Unable to obtain new vitals due to this being a telehealth visit.  Cardiac Risk Factors include: advanced age (>51men, >11 women);diabetes mellitus;dyslipidemia Nutrition Risk Assessment:  Has the patient had any N/V/D within the last 2 months?  No  Does the patient have any non-healing wounds?  No  Has the patient had any unintentional weight loss or weight gain?  No   Diabetes:  Is the patient diabetic?  Yes  If diabetic, was a CBG obtained today?  No  Did the patient bring in their glucometer from home?  No  How often do you monitor your CBG's? Weekly .   Financial Strains and Diabetes Management:  Are you having any financial strains with the device, your supplies or your medication? No .  Does the patient want to be seen by Chronic Care Management for management of their diabetes?  No  Would the patient like to be referred to a Nutritionist or for Diabetic Management?  No   Diabetic Exams:  Diabetic Eye Exam: Completed 09/2022 Diabetic Foot Exam: Overdue, Pt has been advised about the importance in completing this exam. Pt is scheduled for diabetic foot exam on next office visit .     Objective:    Today's Vitals   02/18/23 0832  Weight: 158 lb (71.7 kg)   Height: 5\' 1"  (1.549 m)   Body mass index is 29.85 kg/m.     02/18/2023    8:37 AM 05/18/2022    9:53 AM 02/16/2022    9:09 AM 01/22/2021   10:23 AM 02/08/2012    7:40 AM  Advanced Directives  Does Patient Have a Medical Advance Directive? Yes Yes Yes No Patient does not have advance directive  Type of Advance Directive Healthcare Power of [place];Living will Living will Healthcare Power of [place];Living will    Does patient want to make changes to medical advance directive?  Yes (MAU/Ambulatory/Procedural Areas - Information given)     Copy of Healthcare Power of Attorney in Chart? No - copy requested  No - copy requested    Would patient like information on creating a medical advance directive?    No - Patient declined   Pre-existing out of facility DNR order (yellow form or pink MOST form)     No    Current Medications (verified) Outpatient Encounter Medications as of 02/18/2023  Medication Sig   Cholecalciferol (VITAMIN D) 2000 units tablet Take 1 tablet (2,000 Units total) by mouth daily.   dapagliflozin propanediol (FARXIGA) 10 MG TABS tablet Take 1 tablet (10 mg total) by mouth daily before breakfast.   diazepam (VALIUM) 2 MG tablet Take 1 tablet (2 mg total) by mouth every 6 (six) hours as needed for anxiety.   glucose blood test strip Please dispense as OneTouch Ultra 2. Use as directed to monitor  FSBS 1x weekly. Dx: E11.9.   Lancets (ONETOUCH ULTRASOFT) lancets Use as directed to monitor FSBS 1x weekly. Dx: E11.9.   magnesium gluconate (MAGONATE) 500 MG tablet Take 400 mg by mouth 2 (two) times daily.   metFORMIN (GLUCOPHAGE) 1000 MG tablet TAKE 1 TABLET BY MOUTH DAILY  WITH BREAKFAST   metoprolol succinate (TOPROL-XL) 25 MG 24 hr tablet TAKE 1 TABLET BY MOUTH DAILY   metroNIDAZOLE (METROGEL) 0.75 % gel Apply 1 application topically as needed.   Multiple Vitamin (MULTIVITAMIN WITH MINERALS) TABS tablet Take 1 tablet by mouth daily.   Omega-3 Fatty Acids (FISH OIL) 1000 MG  CAPS Take 1 capsule (1,000 mg total) by mouth 2 (two) times daily.   omeprazole (PRILOSEC) 20 MG capsule TAKE 1 CAPSULE BY MOUTH DAILY AS NEEDED   ramipril (ALTACE) 10 MG capsule TAKE 1 CAPSULE BY MOUTH DAILY   simvastatin (ZOCOR) 20 MG tablet TAKE 1 TABLET BY MOUTH AT  BEDTIME   vitamin B-12 (CYANOCOBALAMIN) 100 MCG tablet Take 1 tablet (100 mcg total) by mouth daily.   calcium gluconate 500 MG tablet Take 1 tablet (500 mg total) 2 (two) times daily by mouth. (Patient not taking: Reported on 02/18/2023)   No facility-administered encounter medications on file as of 02/18/2023.    Allergies (verified) Acetaminophen, Codeine, and Penicillins   History: Past Medical History:  Diagnosis Date   Aortic atherosclerosis (HCC)    Complication of anesthesia    DDD (degenerative disc disease)    Diabetes mellitus    Diastolic dysfunction    Elevated liver function tests    eval by dr Jettie Pagan to fatty liver 2008   GERD (gastroesophageal reflux disease)    Hiatal hernia    Hyperlipidemia    Hypertension    NASH (nonalcoholic steatohepatitis)    Nephrolithiasis    Osteoporosis 01/29/2021   PONV (postoperative nausea and vomiting)    heel surgery   Pulmonary nodule    no change-1995   Rosacea    Ulcer    gastric ulcer, antritis-egd 09/06/08   Vitamin D deficiency    Past Surgical History:  Procedure Laterality Date   BREAST CYST ASPIRATION Left 2005   BREAST SURGERY     benign breast nodules 2005   CHOLECYSTECTOMY     COLONOSCOPY  2008   Dr Medoff-Normal   DILATION AND CURETTAGE, DIAGNOSTIC / THERAPEUTIC     ESOPHAGOGASTRODUODENOSCOPY     FRACTURE SURGERY     MALONEY DILATION  02/08/2012   Procedure: MALONEY DILATION;  Surgeon: West Bali, MD;  Location: [facility];  Service: Endoscopy;  Laterality: N/A;   SAVORY DILATION  02/08/2012   Procedure: SAVORY DILATION;  Surgeon: West Bali, MD;  Location: [facility];  Service: Endoscopy;  Laterality: N/A;   SPINE  SURGERY     Family History  Problem Relation Age of Onset   Hyperlipidemia Mother    Osteoarthritis Mother    COPD Father    Colon cancer Maternal Aunt    Colon cancer Maternal Grandfather    Scoliosis Son    Heart disease Neg Hx    Breast cancer Neg Hx    Social History   Socioeconomic History   Marital status: Married    Spouse name: Not on file   Number of children: 2   Years of education: Not on file   Highest education level: Not on file  Occupational History   Occupation: homemaker  Tobacco Use   Smoking status: Former  Current packs/day: 0.00    Types: Cigarettes    Quit date: 07/06/1988    Years since quitting: 34.6   Smokeless tobacco: Never   Tobacco comments:    intermittent for a few months  Vaping Use   Vaping status: Never Used  Substance and Sexual Activity   Alcohol use: Not Currently    Comment: occasional mixed drink couple per mo   Drug use: No   Sexual activity: Not on file  Other Topics Concern   Not on file  Social History Narrative   Lives w/ husband   Social Determinants of Health   Financial Resource Strain: Low Risk  (02/18/2023)   Overall Financial Resource Strain (CARDIA)    Difficulty of Paying Living Expenses: Not hard at all  Food Insecurity: No Food Insecurity (02/18/2023)   Hunger Vital Sign    Worried About Running Out of Food in the Last Year: Never true    Ran Out of Food in the Last Year: Never true  Transportation Needs: No Transportation Needs (02/18/2023)   PRAPARE - Administrator, Civil Service (Medical): No    Lack of Transportation (Non-Medical): No  Physical Activity: Insufficiently Active (02/18/2023)   Exercise Vital Sign    Days of Exercise per Week: 3 days    Minutes of Exercise per Session: 30 min  Stress: No Stress Concern Present (02/18/2023)   [employer] of Occupational Health - Occupational Stress Questionnaire    Feeling of Stress : Not at all  Social Connections: Moderately Isolated  (02/18/2023)   Social Connection and Isolation Panel [NHANES]    Frequency of Communication with Friends and Family: More than three times a week    Frequency of Social Gatherings with Friends and Family: More than three times a week    Attends Religious Services: Never    Database administrator or Organizations: No    Attends Engineer, structural: Never    Marital Status: Married    Tobacco Counseling Counseling given: Not Answered Tobacco comments: intermittent for a few months   Clinical Intake:  Pre-visit preparation completed: Yes  Pain : No/denies pain     Nutritional Risks: None Diabetes: No  How often do you need to have someone help you when you read instructions, pamphlets, or other written materials from your doctor or pharmacy?: 1 - Never  Interpreter Needed?: No  Information entered by :: Renie Ora, LPN   Activities of Daily Living    02/18/2023    8:37 AM 05/18/2022   10:10 AM  In your present state of health, do you have any difficulty performing the following activities:  Hearing? 0 0  Vision? 0 0  Difficulty concentrating or making decisions? 0 0  Walking or climbing stairs? 0 0  Dressing or bathing? 0 0  Doing errands, shopping? 0 0  Preparing Food and eating ? N N  Using the Toilet? N N  In the past six months, have you accidently leaked urine? N N  Do you have problems with loss of bowel control? N N  Managing your Medications? N N  Managing your Finances? N N  Housekeeping or managing your Housekeeping? N N    Patient Care Team: Bennie Pierini, FNP as PCP - General (Family Medicine) West Bali, MD (Inactive) (Gastroenterology) Erroll Luna, [facility] (Inactive) as Pharmacist (Pharmacist) Michaelle Copas, MD as Referring Physician (Optometry) Danella Maiers, [facility] as Pharmacist (Family Medicine)  Indicate any recent Medical Services  you may have received from other than [facility] providers in the past year (date may be  approximate).     Assessment:   This is a routine wellness examination for Denise Meadows.  Hearing/Vision screen Vision Screening - Comments:: Wears rx glasses - up to date with routine eye exams with  Dr.Le  Dietary issues and exercise activities discussed:     Goals Addressed             This Visit's Progress    Patient Stated   On track    02/16/22 -Hopes to stay as active and independent as she is now       Depression Screen    02/18/2023    8:36 AM 09/22/2022    8:11 AM 05/18/2022    9:48 AM 05/18/2022    9:47 AM 04/20/2022    9:01 AM 03/24/2022    9:10 AM 02/16/2022    9:08 AM  PHQ 2/9 Scores  PHQ - 2 Score 0 0 0 0 0 0 0  PHQ- 9 Score  0   0  1    Fall Risk    02/18/2023    8:33 AM 09/22/2022    8:11 AM 05/18/2022    9:48 AM 04/20/2022    9:01 AM 03/24/2022    9:09 AM  Fall Risk   Falls in the past year? 0 0 0 0 1  Number falls in past yr: 0 0   0  Injury with Fall? 0 0   0  Risk for fall due to : No Fall Risks    History of fall(s)  Follow up Falls prevention discussed    Falls evaluation completed    MEDICARE RISK AT HOME:  Medicare Risk at Home - 02/18/23 9528     Any stairs in or around the home? No    If so, are there any without handrails? No    Home free of loose throw rugs in walkways, pet beds, electrical cords, etc? Yes    Adequate lighting in your home to reduce risk of falls? Yes    Life alert? No    Use of a cane, walker or w/c? No    Grab bars in the bathroom? Yes    Shower chair or bench in shower? Yes    Elevated toilet seat or a handicapped toilet? Yes             TIMED UP AND GO:  Was the test performed?  No    Cognitive Function:        02/18/2023    8:37 AM 02/16/2022    9:10 AM 01/22/2021   10:29 AM  6CIT Screen  What Year? 0 points 0 points 0 points  What month? 0 points 0 points 0 points  What time? 0 points 0 points 0 points  Count back from 20 0 points 0 points 0 points  Months in reverse 0 points 0 points 0 points   Repeat phrase 0 points 0 points 0 points  Total Score 0 points 0 points 0 points    Immunizations Immunization History  Administered Date(s) Administered   Fluad Quad(high Dose 65+) 04/03/2021, 03/24/2022   Influenza, High Dose Seasonal PF 04/24/2017   Influenza,inj,Quad PF,6+ Mos 04/08/2016   Influenza-Unspecified 04/24/2017, 04/05/2018   PFIZER(Purple Top)SARS-COV-2 Vaccination 07/26/2019, 08/15/2019, 04/12/2020   Pneumococcal Conjugate-13 06/03/2016   Pneumococcal Polysaccharide-23 06/07/2017   Tdap 09/20/2014   Zoster, Live 03/25/2015    TDAP status: Up to date  Flu Vaccine  status: Up to date  Pneumococcal vaccine status: Up to date  Covid-19 vaccine status: Completed vaccines  Qualifies for Shingles Vaccine? Yes   Zostavax completed No   Shingrix Completed?: No.    Education has been provided regarding the importance of this vaccine. Patient has been advised to call insurance company to determine out of pocket expense if they have not yet received this vaccine. Advised may also receive vaccine at local pharmacy or Health Dept. Verbalized acceptance and understanding.  Screening Tests Health Maintenance  Topic Date Due   Zoster Vaccines- Shingrix (1 of 2) [birth year]1970   COVID-19 Vaccine (4 - 2023-24 season) 03/06/2022   OPHTHALMOLOGY EXAM  09/12/2022   DEXA SCAN  01/30/2023   INFLUENZA VACCINE  02/04/2023   HEMOGLOBIN A1C  03/25/2023   Diabetic kidney evaluation - Urine ACR  09/10/2023   Diabetic kidney evaluation - eGFR measurement  09/22/2023   FOOT EXAM  09/22/2023   MAMMOGRAM  02/10/2024   Medicare Annual Wellness (AWV)  02/18/2024   DTaP/Tdap/Td (2 - Td or Tdap) 09/19/2024   Colonoscopy  09/02/2032   Pneumonia Vaccine 83+ Years old  Completed   Hepatitis C Screening  Completed   HPV VACCINES  Aged Out    Health Maintenance  Health Maintenance Due  Topic Date Due   Zoster Vaccines- Shingrix (1 of 2) 02[birth year]11[birth year]1970   COVID-19 Vaccine (4 - 2023-24 season)  03/06/2022   OPHTHALMOLOGY EXAM  09/12/2022   DEXA SCAN  01/30/2023   INFLUENZA VACCINE  02/04/2023    Colorectal cancer screening: Type of screening: Colonoscopy. Completed 09/02/2022. Repeat every 10 years  Mammogram status: Ordered Scheduled 03/03/2023. Pt provided with contact info and advised to call to schedule appt.   Bone Density status: Ordered 02/18/2023. Pt provided with contact info and advised to call to schedule appt.  Lung Cancer Screening: (Low Dose CT Chest recommended if Age 28-80 years, 20 pack-year currently smoking OR have quit w/in 15years.) does not qualify.   Lung Cancer Screening Referral: n/a  Additional Screening:  Hepatitis C Screening: does not qualify; Completed 04/08/2016  Vision Screening: Recommended annual ophthalmology exams for early detection of glaucoma and other disorders of the eye. Is the patient up to date with their annual eye exam?  Yes  Who is the provider or what is the name of the office in which the patient attends annual eye exams? Dr.Le  If pt is not established with a provider, would they like to be referred to a provider to establish care? No .   Dental Screening: Recommended annual dental exams for proper oral hygiene   Community Resource Referral / Chronic Care Management: CRR required this visit?  No   CCM required this visit?  No     Plan:     I have personally reviewed and noted the following in the patient's chart:   Medical and social history Use of alcohol, tobacco or illicit drugs  Current medications and supplements including opioid prescriptions. Patient is not currently taking opioid prescriptions. Functional ability and status Nutritional status Physical activity Advanced directives List of other physicians Hospitalizations, surgeries, and ER visits in previous 12 months Vitals Screenings to include cognitive, depression, and falls Referrals and appointments  In addition, I have reviewed and  discussed with patient certain preventive protocols, quality metrics, and best practice recommendations. A written personalized care plan for preventive services as well as general preventive health recommendations were provided to patient.     Lorrene Reid, LPN  02/18/2023   After Visit Summary: (MyChart) Due to this being a telephonic visit, the after visit summary with patients personalized plan was offered to patient via MyChart   Nurse Notes: none

## 2023-02-18 NOTE — Patient Instructions (Signed)
Denise Meadows , Thank you for taking time to come for your Medicare Wellness Visit. I appreciate your ongoing commitment to your health goals. Please review the following plan we discussed and let me know if I can assist you in the future.   Referrals/Orders/Follow-Ups/Clinician Recommendations: Aim for 30 minutes of exercise or brisk walking, 6-8 glasses of water, and 5 servings of fruits and vegetables each day.   This is a list of the screening recommended for you and due dates:  Health Maintenance  Topic Date Due   Zoster (Shingles) Vaccine (1 of 2) 02/11/1970   COVID-19 Vaccine (4 - 2023-24 season) 03/06/2022   Eye exam for diabetics  09/12/2022   DEXA scan (bone density measurement)  01/30/2023   Flu Shot  02/04/2023   Hemoglobin A1C  03/25/2023   Yearly kidney health urinalysis for diabetes  09/10/2023   Yearly kidney function blood test for diabetes  09/22/2023   Complete foot exam   09/22/2023   Mammogram  02/10/2024   Medicare Annual Wellness Visit  02/18/2024   DTaP/Tdap/Td vaccine (2 - Td or Tdap) 09/19/2024   Colon Cancer Screening  09/02/2032   Pneumonia Vaccine  Completed   Hepatitis C Screening  Completed   HPV Vaccine  Aged Out    Advanced directives: (Copy Requested) Please bring a copy of your health care power of attorney and living will to the office to be added to your chart at your convenience.  Next Medicare Annual Wellness Visit scheduled for next year: Yes  Preventive Care 39 Years and Older, Female Preventive care refers to lifestyle choices and visits with your health care provider that can promote health and wellness. What does preventive care include? A yearly physical exam. This is also called an annual well check. Dental exams once or twice a year. Routine eye exams. Ask your health care provider how often you should have your eyes checked. Personal lifestyle choices, including: Daily care of your teeth and gums. Regular physical activity. Eating a  healthy diet. Avoiding tobacco and drug use. Limiting alcohol use. Practicing safe sex. Taking low-dose aspirin every day. Taking vitamin and mineral supplements as recommended by your health care provider. What happens during an annual well check? The services and screenings done by your health care provider during your annual well check will depend on your age, overall health, lifestyle risk factors, and family history of disease. Counseling  Your health care provider may ask you questions about your: Alcohol use. Tobacco use. Drug use. Emotional well-being. Home and relationship well-being. Sexual activity. Eating habits. History of falls. Memory and ability to understand (cognition). Work and work Astronomer. Reproductive health. Screening  You may have the following tests or measurements: Height, weight, and BMI. Blood pressure. Lipid and cholesterol levels. These may be checked every 5 years, or more frequently if you are over 64 years old. Skin check. Lung cancer screening. You may have this screening every year starting at age 30 if you have a 30-pack-year history of smoking and currently smoke or have quit within the past 15 years. Fecal occult blood test (FOBT) of the stool. You may have this test every year starting at age 7. Flexible sigmoidoscopy or colonoscopy. You may have a sigmoidoscopy every 5 years or a colonoscopy every 10 years starting at age 5. Hepatitis C blood test. Hepatitis B blood test. Sexually transmitted disease (STD) testing. Diabetes screening. This is done by checking your blood sugar (glucose) after you have not eaten for a while (  fasting). You may have this done every 1-3 years. Bone density scan. This is done to screen for osteoporosis. You may have this done starting at age 42. Mammogram. This may be done every 1-2 years. Talk to your health care provider about how often you should have regular mammograms. Talk with your health care  provider about your test results, treatment options, and if necessary, the need for more tests. Vaccines  Your health care provider may recommend certain vaccines, such as: Influenza vaccine. This is recommended every year. Tetanus, diphtheria, and acellular pertussis (Tdap, Td) vaccine. You may need a Td booster every 10 years. Zoster vaccine. You may need this after age 26. Pneumococcal 13-valent conjugate (PCV13) vaccine. One dose is recommended after age 28. Pneumococcal polysaccharide (PPSV23) vaccine. One dose is recommended after age 74. Talk to your health care provider about which screenings and vaccines you need and how often you need them. This information is not intended to replace advice given to you by your health care provider. Make sure you discuss any questions you have with your health care provider. Document Released: 07/19/2015 Document Revised: 03/11/2016 Document Reviewed: 04/23/2015 Elsevier Interactive Patient Education  2017 [employer].  Fall Prevention in the Home Falls can cause injuries. They can happen to people of all ages. There are many things you can do to make your home safe and to help prevent falls. What can I do on the outside of my home? Regularly fix the edges of walkways and driveways and fix any cracks. Remove anything that might make you trip as you walk through a door, such as a raised step or threshold. Trim any bushes or trees on the path to your home. Use bright outdoor lighting. Clear any walking paths of anything that might make someone trip, such as rocks or tools. Regularly check to see if handrails are loose or broken. Make sure that both sides of any steps have handrails. Any raised decks and porches should have guardrails on the edges. Have any leaves, snow, or ice cleared regularly. Use sand or salt on walking paths during winter. Clean up any spills in your garage right away. This includes oil or grease spills. What can I do in the  bathroom? Use night lights. Install grab bars by the toilet and in the tub and shower. Do not use towel bars as grab bars. Use non-skid mats or decals in the tub or shower. If you need to sit down in the shower, use a plastic, non-slip stool. Keep the floor dry. Clean up any water that spills on the floor as soon as it happens. Remove soap buildup in the tub or shower regularly. Attach bath mats securely with double-sided non-slip rug tape. Do not have throw rugs and other things on the floor that can make you trip. What can I do in the bedroom? Use night lights. Make sure that you have a light by your bed that is easy to reach. Do not use any sheets or blankets that are too big for your bed. They should not hang down onto the floor. Have a firm chair that has side arms. You can use this for support while you get dressed. Do not have throw rugs and other things on the floor that can make you trip. What can I do in the kitchen? Clean up any spills right away. Avoid walking on wet floors. Keep items that you use a lot in easy-to-reach places. If you need to reach something above you, use  a strong step stool that has a grab bar. Keep electrical cords out of the way. Do not use floor polish or wax that makes floors slippery. If you must use wax, use non-skid floor wax. Do not have throw rugs and other things on the floor that can make you trip. What can I do with my stairs? Do not leave any items on the stairs. Make sure that there are handrails on both sides of the stairs and use them. Fix handrails that are broken or loose. Make sure that handrails are as long as the stairways. Check any carpeting to make sure that it is firmly attached to the stairs. Fix any carpet that is loose or worn. Avoid having throw rugs at the top or bottom of the stairs. If you do have throw rugs, attach them to the floor with carpet tape. Make sure that you have a light switch at the top of the stairs and the  bottom of the stairs. If you do not have them, ask someone to add them for you. What else can I do to help prevent falls? Wear shoes that: Do not have high heels. Have rubber bottoms. Are comfortable and fit you well. Are closed at the toe. Do not wear sandals. If you use a stepladder: Make sure that it is fully opened. Do not climb a closed stepladder. Make sure that both sides of the stepladder are locked into place. Ask someone to hold it for you, if possible. Clearly mark and make sure that you can see: Any grab bars or handrails. First and last steps. Where the edge of each step is. Use tools that help you move around (mobility aids) if they are needed. These include: Canes. Walkers. Scooters. Crutches. Turn on the lights when you go into a dark area. Replace any light bulbs as soon as they burn out. Set up your furniture so you have a clear path. Avoid moving your furniture around. If any of your floors are uneven, fix them. If there are any pets around you, be aware of where they are. Review your medicines with your doctor. Some medicines can make you feel dizzy. This can increase your chance of falling. Ask your doctor what other things that you can do to help prevent falls. This information is not intended to replace advice given to you by your health care provider. Make sure you discuss any questions you have with your health care provider. Document Released: 04/18/2009 Document Revised: 11/28/2015 Document Reviewed: 07/27/2014 Elsevier Interactive Patient Education  2017 [employer].

## 2023-03-03 ENCOUNTER — Ambulatory Visit
Admission: RE | Admit: 2023-03-03 | Discharge: 2023-03-03 | Disposition: A | Discharge status: Home / Self Care | Payer: Medicare Other | Source: Ambulatory Visit | Attending: Nurse Practitioner | Admitting: Nurse Practitioner

## 2023-03-03 DIAGNOSIS — Z1231 Encounter for screening mammogram for malignant neoplasm of breast: Secondary | ICD-10-CM | POA: Diagnosis not present

## 2023-03-25 ENCOUNTER — Ambulatory Visit: Payer: Medicare Other | Admitting: Nurse Practitioner

## 2023-03-25 ENCOUNTER — Telehealth: Payer: Self-pay | Admitting: Nurse Practitioner

## 2023-03-25 ENCOUNTER — Ambulatory Visit (INDEPENDENT_AMBULATORY_CARE_PROVIDER_SITE_OTHER): Payer: Medicare Other

## 2023-03-25 ENCOUNTER — Encounter: Payer: Self-pay | Admitting: Nurse Practitioner

## 2023-03-25 VITALS — BP 128/64 | HR 71 | Temp 97.4°F | Resp 20 | Ht 61.0 in | Wt 160.0 lb

## 2023-03-25 DIAGNOSIS — L719 Rosacea, unspecified: Secondary | ICD-10-CM

## 2023-03-25 DIAGNOSIS — Z780 Asymptomatic menopausal state: Secondary | ICD-10-CM | POA: Diagnosis not present

## 2023-03-25 DIAGNOSIS — E1169 Type 2 diabetes mellitus with other specified complication: Secondary | ICD-10-CM | POA: Diagnosis not present

## 2023-03-25 DIAGNOSIS — Z6830 Body mass index (BMI) 30.0-30.9, adult: Secondary | ICD-10-CM

## 2023-03-25 DIAGNOSIS — I700 Atherosclerosis of aorta: Secondary | ICD-10-CM

## 2023-03-25 DIAGNOSIS — E785 Hyperlipidemia, unspecified: Secondary | ICD-10-CM | POA: Diagnosis not present

## 2023-03-25 DIAGNOSIS — G4700 Insomnia, unspecified: Secondary | ICD-10-CM

## 2023-03-25 DIAGNOSIS — N1831 Chronic kidney disease, stage 3a: Secondary | ICD-10-CM | POA: Diagnosis not present

## 2023-03-25 DIAGNOSIS — K219 Gastro-esophageal reflux disease without esophagitis: Secondary | ICD-10-CM

## 2023-03-25 DIAGNOSIS — E119 Type 2 diabetes mellitus without complications: Secondary | ICD-10-CM

## 2023-03-25 DIAGNOSIS — Z7984 Long term (current) use of oral hypoglycemic drugs: Secondary | ICD-10-CM | POA: Diagnosis not present

## 2023-03-25 DIAGNOSIS — F410 Panic disorder [episodic paroxysmal anxiety] without agoraphobia: Secondary | ICD-10-CM

## 2023-03-25 DIAGNOSIS — E559 Vitamin D deficiency, unspecified: Secondary | ICD-10-CM | POA: Diagnosis not present

## 2023-03-25 DIAGNOSIS — Z23 Encounter for immunization: Secondary | ICD-10-CM | POA: Diagnosis not present

## 2023-03-25 DIAGNOSIS — I10 Essential (primary) hypertension: Secondary | ICD-10-CM

## 2023-03-25 LAB — LIPID PANEL

## 2023-03-25 MED ORDER — METFORMIN HCL 1000 MG PO TABS: 1000.0000 mg | ORAL_TABLET | Freq: Every day | ORAL | 1 refills | Status: DC

## 2023-03-25 MED ORDER — RAMIPRIL 10 MG PO CAPS: 10.0000 mg | ORAL_CAPSULE | Freq: Every day | ORAL | 1 refills | Status: DC

## 2023-03-25 MED ORDER — METOPROLOL SUCCINATE ER 25 MG PO TB24: 25.0000 mg | ORAL_TABLET | Freq: Every day | ORAL | 1 refills | Status: DC

## 2023-03-25 MED ORDER — OMEPRAZOLE 20 MG PO CPDR: 20.0000 mg | DELAYED_RELEASE_CAPSULE | Freq: Every day | ORAL | 1 refills | Status: DC | PRN

## 2023-03-25 MED ORDER — DIAZEPAM 2 MG PO TABS: 2.0000 mg | ORAL_TABLET | Freq: Four times a day (QID) | ORAL | 1 refills | Status: DC | PRN

## 2023-03-25 MED ORDER — DAPAGLIFLOZIN PROPANEDIOL 10 MG PO TABS: 10.0000 mg | ORAL_TABLET | Freq: Every day | ORAL | 1 refills | Status: DC

## 2023-03-25 MED ORDER — SIMVASTATIN 20 MG PO TABS: 20.0000 mg | ORAL_TABLET | Freq: Every day | ORAL | 1 refills | Status: DC

## 2023-03-25 NOTE — Progress Notes (Signed)
Subjective:    Patient ID: Denise Meadows, female    DOB: Jul 20, 1950, 72 y.o.   MRN: [id number]   Chief Complaint: medical management of chronic issues     HPI:  Denise Meadows is a 72 y.o. who identifies as a female who was assigned female at birth.   Social history: Lives with: husband Work history: retired- does a lot of traveling   Comes in today for follow up of the following chronic medical issues:  1. Primary hypertension No c/o chest pain, sob or headache. Does not check blood pressure at home. BP Readings from Last 3 Encounters:  10/30/22 130/74  09/22/22 124/75  04/20/22 118/74     2. Aortic atherosclerosis (HCC) Is doing well. Doe snot see cardiology.  3. Gastroesophageal reflux disease, unspecified whether esophagitis present Takes omeprazole as needed  4. Hyperlipidemia associated with type 2 diabetes mellitus (HCC) Does try to watch diet but does no dedicated exercise. Lab Results  Component Value Date   CHOL 166 09/22/2022   HDL 52 09/22/2022   LDLCALC 82 09/22/2022   TRIG 192 (H) 09/22/2022   CHOLHDL 3.2 09/22/2022     5. Diabetes mellitus treated with oral medication (HCC) Does not check blood sugars at home Lab Results  Component Value Date   HGBA1C 6.9 (H) 09/22/2022     6. Stage 3a chronic kidney disease (HCC) No voiding issues Lab Results  Component Value Date   CREATININE 1.10 (H) 09/22/2022     7. Panic attacks Is on  valium and is doing well.    09/22/2022    8:12 AM 04/20/2022    9:02 AM 01/14/2022    3:48 PM 09/11/2021    8:07 AM  GAD 7 : Generalized Anxiety Score  Nervous, Anxious, on Edge 0 0 0 0  Control/stop worrying 0 0 0 0  Worry too much - different things 0 0 0 0  Trouble relaxing 0 0 0 0  Restless 0 0 0 0  Easily annoyed or irritable 0 0 0 0  Afraid - awful might happen 0 0 0 0  Total GAD 7 Score 0 0 0 0  Anxiety Difficulty Not difficult at all Not difficult at all Not difficult at all Not difficult at all        8. Insomnia, unspecified type No issues  9. Rosacea Is on metrogel and that is working well for her.  10. Vitamin D deficiency Is on daily vitamin d supplement  11. BMI 30.0-30.9,adult No recent weight changes Wt Readings from Last 3 Encounters:  03/25/23 160 lb (72.6 kg)  02/18/23 158 lb (71.7 kg)  09/22/22 156 lb 6.4 oz (70.9 kg)   BMI Readings from Last 3 Encounters:  03/25/23 30.23 kg/m  02/18/23 29.85 kg/m  09/22/22 29.36 kg/m      New complaints: None today  Allergies  Allergen Reactions   Acetaminophen Nausea And Vomiting   Codeine Nausea And Vomiting   Penicillins Rash   Outpatient Encounter Medications as of 03/25/2023  Medication Sig   calcium gluconate 500 MG tablet Take 1 tablet (500 mg total) 2 (two) times daily by mouth. (Patient not taking: Reported on 02/18/2023)   Cholecalciferol (VITAMIN D) 2000 units tablet Take 1 tablet (2,000 Units total) by mouth daily.   dapagliflozin propanediol (FARXIGA) 10 MG TABS tablet Take 1 tablet (10 mg total) by mouth daily before breakfast.   diazepam (VALIUM) 2 MG tablet Take 1 tablet (2 mg total) by mouth every  6 (six) hours as needed for anxiety.   glucose blood test strip Please dispense as OneTouch Ultra 2. Use as directed to monitor FSBS 1x weekly. Dx: E11.9.   Lancets (ONETOUCH ULTRASOFT) lancets Use as directed to monitor FSBS 1x weekly. Dx: E11.9.   magnesium gluconate (MAGONATE) 500 MG tablet Take 400 mg by mouth 2 (two) times daily.   metFORMIN (GLUCOPHAGE) 1000 MG tablet TAKE 1 TABLET BY MOUTH DAILY  WITH BREAKFAST   metoprolol succinate (TOPROL-XL) 25 MG 24 hr tablet TAKE 1 TABLET BY MOUTH DAILY   metroNIDAZOLE (METROGEL) 0.75 % gel Apply 1 application topically as needed.   Multiple Vitamin (MULTIVITAMIN WITH MINERALS) TABS tablet Take 1 tablet by mouth daily.   Omega-3 Fatty Acids (FISH OIL) 1000 MG CAPS Take 1 capsule (1,000 mg total) by mouth 2 (two) times daily.   omeprazole (PRILOSEC) 20  MG capsule TAKE 1 CAPSULE BY MOUTH DAILY AS NEEDED   ramipril (ALTACE) 10 MG capsule TAKE 1 CAPSULE BY MOUTH DAILY   simvastatin (ZOCOR) 20 MG tablet TAKE 1 TABLET BY MOUTH AT  BEDTIME   vitamin B-12 (CYANOCOBALAMIN) 100 MCG tablet Take 1 tablet (100 mcg total) by mouth daily.   No facility-administered encounter medications on file as of 03/25/2023.    Past Surgical History:  Procedure Laterality Date   BREAST CYST ASPIRATION Left 2005   BREAST SURGERY     benign breast nodules 2005   CHOLECYSTECTOMY     COLONOSCOPY  2008   Dr Medoff-Normal   DILATION AND CURETTAGE, DIAGNOSTIC / THERAPEUTIC     ESOPHAGOGASTRODUODENOSCOPY     FRACTURE SURGERY     MALONEY DILATION  02/08/2012   Procedure: MALONEY DILATION;  Surgeon: West Bali, MD;  Location: [facility];  Service: Endoscopy;  Laterality: N/A;   SAVORY DILATION  02/08/2012   Procedure: SAVORY DILATION;  Surgeon: West Bali, MD;  Location: [facility];  Service: Endoscopy;  Laterality: N/A;   SPINE SURGERY      Family History  Problem Relation Age of Onset   Hyperlipidemia Mother    Osteoarthritis Mother    COPD Father    Colon cancer Maternal Aunt    Colon cancer Maternal Grandfather    Scoliosis Son    Heart disease Neg Hx    Breast cancer Neg Hx       Controlled substance contract: n/a     Review of Systems  Constitutional:  Negative for diaphoresis.  Eyes:  Negative for pain.  Respiratory:  Negative for shortness of breath.   Cardiovascular:  Negative for chest pain, palpitations and leg swelling.  Gastrointestinal:  Negative for abdominal pain.  Endocrine: Negative for polydipsia.  Skin:  Negative for rash.  Neurological:  Negative for dizziness, weakness and headaches.  Hematological:  Does not bruise/bleed easily.  All other systems reviewed and are negative.      Objective:   Physical Exam Vitals and nursing note reviewed.  Constitutional:      General: She is not in acute distress.     Appearance: Normal appearance. She is well-developed.  HENT:     Head: Normocephalic.     Right Ear: Tympanic membrane normal.     Left Ear: Tympanic membrane normal.     Nose: Nose normal.     Mouth/Throat:     Mouth: Mucous membranes are moist.  Eyes:     Pupils: Pupils are equal, round, and reactive to light.  Neck:     Vascular: No  carotid bruit or JVD.  Cardiovascular:     Rate and Rhythm: Normal rate and regular rhythm.     Heart sounds: Normal heart sounds.  Pulmonary:     Effort: Pulmonary effort is normal. No respiratory distress.     Breath sounds: Normal breath sounds. No wheezing or rales.  Chest:     Chest wall: No tenderness.  Abdominal:     General: Bowel sounds are normal. There is no distension or abdominal bruit.     Palpations: Abdomen is soft. There is no hepatomegaly, splenomegaly, mass or pulsatile mass.     Tenderness: There is no abdominal tenderness.  Musculoskeletal:        General: Normal range of motion.     Cervical back: Normal range of motion and neck supple.  Lymphadenopathy:     Cervical: No cervical adenopathy.  Skin:    General: Skin is warm and dry.  Neurological:     Mental Status: She is alert and oriented to person, place, and time.     Deep Tendon Reflexes: Reflexes are normal and symmetric.  Psychiatric:        Behavior: Behavior normal.        Thought Content: Thought content normal.        Judgment: Judgment normal.    BP 128/64   Pulse 71   Temp (!) 97.4 F (36.3 C) (Temporal)   Resp 20   Ht 5\' 1"  (1.549 m)   Wt 160 lb (72.6 kg)   SpO2 99%   BMI 30.23 kg/m    HGBA1c 7.3%     Assessment & Plan:  Denise Meadows comes in today with chief complaint of Medical Management of Chronic Issues   Diagnosis and orders addressed:  1. Primary hypertension Low sodium diet - CBC with Differential/Platelet - CMP14+EGFR - metoprolol succinate (TOPROL-XL) 25 MG 24 hr tablet; Take 1 tablet (25 mg total) by mouth daily.   Dispense: 100 tablet; Refill: 1 - ramipril (ALTACE) 10 MG capsule; Take 1 capsule (10 mg total) by mouth daily.  Dispense: 100 capsule; Refill: 1  2. Aortic atherosclerosis (HCC)  3. Gastroesophageal reflux disease, unspecified whether esophagitis present Avoid spicy foods Do not eat 2 hours prior to bedtime  - omeprazole (PRILOSEC) 20 MG capsule; Take 1 capsule (20 mg total) by mouth daily as needed.  Dispense: 100 capsule; Refill: 1  4. Hyperlipidemia associated with type 2 diabetes mellitus (HCC) Low fat diet - Lipid panel - simvastatin (ZOCOR) 20 MG tablet; Take 1 tablet (20 mg total) by mouth at bedtime.  Dispense: 100 tablet; Refill: 1  5. Diabetes mellitus treated with oral medication (HCC) Continue to watch carbs in diet - Bayer DCA Hb A1c Waived - metoprolol succinate (TOPROL-XL) 25 MG 24 hr tablet; Take 1 tablet (25 mg total) by mouth daily.  Dispense: 100 tablet; Refill: 1 - metFORMIN (GLUCOPHAGE) 1000 MG tablet; Take 1 tablet (1,000 mg total) by mouth daily with breakfast.  Dispense: 100 tablet; Refill: 1  6. Stage 3a chronic kidney disease (HCC) Labs pending - dapagliflozin propanediol (FARXIGA) 10 MG TABS tablet; Take 1 tablet (10 mg total) by mouth daily before breakfast.  Dispense: 90 tablet; Refill: 1  7. Panic attacks - diazepam (VALIUM) 2 MG tablet; Take 1 tablet (2 mg total) by mouth every 6 (six) hours as needed for anxiety.  Dispense: 20 tablet; Refill: 1  8. Insomnia, unspecified type Bedtime routine  9. Rosacea  10. Vitamin D deficiency Continue vitamin d supplement  11. BMI 30.0-30.9,adult Discussed diet and exercise for person with BMI >25 Will recheck weight in 3-6 months    Labs pending Health Maintenance reviewed Diet and exercise encouraged  Follow up plan: 6 months   Mary-Margaret Daphine Deutscher, FNP

## 2023-03-25 NOTE — Telephone Encounter (Signed)
Called and cancelled [place] with mail order. Patient notified and verbalized understanding

## 2023-03-26 DIAGNOSIS — M8589 Other specified disorders of bone density and structure, multiple sites: Secondary | ICD-10-CM | POA: Diagnosis not present

## 2023-03-26 DIAGNOSIS — Z780 Asymptomatic menopausal state: Secondary | ICD-10-CM | POA: Diagnosis not present

## 2023-03-26 LAB — CMP14+EGFR
ALT: 16 IU/L (ref 0–32)
AST: 19 IU/L (ref 0–40)
Albumin: 4.2 g/dL (ref 3.8–4.8)
Alkaline Phosphatase: 99 IU/L (ref 44–121)
BUN/Creatinine Ratio: 18 (ref 12–28)
BUN: 22 mg/dL (ref 8–27)
Bilirubin Total: 0.5 mg/dL (ref 0.0–1.2)
CO2: 23 mmol/L (ref 20–29)
Calcium: 10.1 mg/dL (ref 8.7–10.3)
Chloride: 101 mmol/L (ref 96–106)
Creatinine, Ser: 1.25 mg/dL — ABNORMAL HIGH (ref 0.57–1.00)
Globulin, Total: 2.4 g/dL (ref 1.5–4.5)
Glucose: 182 mg/dL — ABNORMAL HIGH (ref 70–99)
Potassium: 4.7 mmol/L (ref 3.5–5.2)
Sodium: 139 mmol/L (ref 134–144)
Total Protein: 6.6 g/dL (ref 6.0–8.5)
eGFR: 46 mL/min/{1.73_m2} — ABNORMAL LOW (ref >59)

## 2023-03-26 LAB — BAYER DCA HB A1C WAIVED: HB A1C (BAYER DCA - WAIVED): 7.3 % — ABNORMAL HIGH (ref 4.8–5.6)

## 2023-03-26 LAB — CBC WITH DIFFERENTIAL/PLATELET
Basophils Absolute: 0.1 10*3/uL (ref 0.0–0.2)
Basos: 1 %
EOS (ABSOLUTE): 0.3 10*3/uL (ref 0.0–0.4)
Eos: 5 %
Hematocrit: 44.9 % (ref 34.0–46.6)
Hemoglobin: 14.4 g/dL (ref 11.1–15.9)
Immature Grans (Abs): 0 10*3/uL (ref 0.0–0.1)
Immature Granulocytes: 0 %
Lymphocytes Absolute: 1.9 10*3/uL (ref 0.7–3.1)
Lymphs: 31 %
MCH: 31.1 pg (ref 26.6–33.0)
MCHC: 32.1 g/dL (ref 31.5–35.7)
MCV: 97 fL (ref 79–97)
Monocytes Absolute: 0.7 10*3/uL (ref 0.1–0.9)
Monocytes: 12 %
Neutrophils Absolute: 3.2 10*3/uL (ref 1.4–7.0)
Neutrophils: 51 %
Platelets: 281 10*3/uL (ref 150–450)
RBC: 4.63 x10E6/uL (ref 3.77–5.28)
RDW: 12.5 % (ref 11.7–15.4)
WBC: 6.3 10*3/uL (ref 3.4–10.8)

## 2023-03-26 LAB — LIPID PANEL
Chol/HDL Ratio: 3.6 ratio (ref 0.0–4.4)
Cholesterol, Total: 164 mg/dL (ref 100–199)
HDL: 46 mg/dL (ref >39)
LDL Chol Calc (NIH): 85 mg/dL (ref 0–99)
Triglycerides: 196 mg/dL — ABNORMAL HIGH (ref 0–149)
VLDL Cholesterol Cal: 33 mg/dL (ref 5–40)

## 2023-05-20 ENCOUNTER — Other Ambulatory Visit (INDEPENDENT_AMBULATORY_CARE_PROVIDER_SITE_OTHER): Payer: Medicare Other

## 2023-05-20 ENCOUNTER — Encounter: Payer: Self-pay | Admitting: Orthopedic Surgery

## 2023-05-20 ENCOUNTER — Ambulatory Visit: Payer: Medicare Other | Admitting: Orthopedic Surgery

## 2023-05-20 VITALS — BP 125/70 | HR 68 | Ht 61.0 in | Wt 161.2 lb

## 2023-05-20 DIAGNOSIS — M654 Radial styloid tenosynovitis [de Quervain]: Secondary | ICD-10-CM | POA: Diagnosis not present

## 2023-05-20 DIAGNOSIS — M25532 Pain in left wrist: Secondary | ICD-10-CM

## 2023-05-20 MED ORDER — METHYLPREDNISOLONE ACETATE 40 MG/ML IJ SUSP
40.0000 mg | Freq: Once | INTRAMUSCULAR | Status: AC
  Administered 2023-05-20: 40 mg via INTRA_ARTICULAR

## 2023-05-20 NOTE — Patient Instructions (Signed)
VOLTAREN GEL /TOPICAL   2GM 3 TIMES A DAY

## 2023-05-20 NOTE — Progress Notes (Signed)
Office Visit Note   Patient: Denise Meadows           Date of Birth: 10-05-1950           MRN: [id number] Visit Date: 05/20/2023 Requested by: Bennie Pierini, FNP [address] [place],  [place] [address] PCP: Bennie Pierini, FNP   Assessment & Plan:  New onset first extensor compartment tenosynovitis syndrome left wrist.  Recommend anti-inflammatories and splinting.  The patient has indicated she has some mild form of kidney failure so we will avoid oral anti-inflammatories but discussed possibility of going to every other day anti-inflammatories if diclofenac gel and splinting do not work  Encounter Diagnoses  Name Primary?   Pain in left wrist Yes   De Quervain's tenosynovitis, left     Meds ordered this encounter  Medications   methylPREDNISolone acetate (DEPO-MEDROL) injection 40 mg   methylPREDNISolone acetate (DEPO-MEDROL) injection 40 mg     Subjective: Chief Complaint  Patient presents with   Wrist Pain    L/ has a knot on wrist that is painful to the touch and when doing daily activities.    HPI: 72 year old female 1 to 2-week history of pain and a knot forming over the left wrist near the first extensor compartment and base of the thumb this is exacerbated by activities that include ulnar deviation              ROS: There is no evidence of carpal tunnel syndrome numbness tingling etc. no evidence of previous trauma   Images personally read and my interpretation : See dictated report x-rays do not show any acute fracture and there is no spur formation in the area in question  Visit Diagnoses:  1. Pain in left wrist   2. De Quervain's tenosynovitis, left      Follow-Up Instructions: Return if symptoms worsen or fail to improve.    Objective: Vital Signs: BP 125/70   Pulse 68   Ht 5\' 1"  (1.549 m)   Wt 161 lb 4 oz (73.1 kg)   BMI 30.47 kg/m   Physical Exam Vitals and nursing note reviewed.  Constitutional:      Appearance: Normal  appearance.  HENT:     Head: Normocephalic and atraumatic.  Eyes:     General: No scleral icterus.       Right eye: No discharge.        Left eye: No discharge.     Extraocular Movements: Extraocular movements intact.     Conjunctiva/sclera: Conjunctivae normal.     Pupils: Pupils are equal, round, and reactive to light.  Cardiovascular:     Rate and Rhythm: Normal rate.     Pulses: Normal pulses.  Skin:    General: Skin is warm and dry.     Capillary Refill: Capillary refill takes less than 2 seconds.  Neurological:     General: No focal deficit present.     Mental Status: She is alert and oriented to person, place, and time.  Psychiatric:        Mood and Affect: Mood normal.        Behavior: Behavior normal.        Thought Content: Thought content normal.        Judgment: Judgment normal.      Ortho Exam  Left wrist skin is intact no mass there is swelling over the first extensor compartment when you lift the skin away from the first extensor compartment and palp pain over the  first extensor compartment the tenderness is reproduced this is also reproduced with the Finkelstein's maneuver.  The tendons are intact on the flexor and extensor side the neurovascular exam is normal  Specialty Comments:  No specialty comments available.  Imaging: DG Wrist Complete Left  Result Date: 05/20/2023 X-ray report Chief complaint left wrist pain near the first extensor compartment Images 3 views of the left wrist including an additional fourth view Reading: Normal findings Impression: Normal left wrist x-ray     PMFS History: Patient Active Problem List   Diagnosis Date Noted   BMI 30.0-30.9,adult 09/22/2022   Aortic atherosclerosis (HCC) 05/14/2021   Stage 3a chronic kidney disease (HCC) 05/14/2021   Osteoporosis 01/29/2021   Panic attacks 10/28/2020   Controlled substance agreement signed 10/28/2020   Diastolic dysfunction 12/01/2018   Reduced libido 07/27/2018   HTN  (hypertension) 08/29/2012   Hyperlipidemia associated with type 2 diabetes mellitus (HCC) 08/29/2012   Vitamin D deficiency 08/29/2012   Diabetes mellitus treated with oral medication (HCC) 08/29/2012   Rosacea 08/29/2012   H/O nephrolithotomy with removal of calculi 08/29/2012   Gastroesophageal reflux disease 01/18/2012   Neck mass 10/01/2010   Insomnia 10/01/2010   Hiatal hernia    Past Medical History:  Diagnosis Date   Aortic atherosclerosis (HCC)    Complication of anesthesia    DDD (degenerative disc disease)    Diabetes mellitus    Diastolic dysfunction    Elevated liver function tests    eval by dr Jettie Pagan to fatty liver 2008   GERD (gastroesophageal reflux disease)    Hiatal hernia    Hyperlipidemia    Hypertension    NASH (nonalcoholic steatohepatitis)    Nephrolithiasis    Osteoporosis 01/29/2021   PONV (postoperative nausea and vomiting)    heel surgery   Pulmonary nodule    no change-1995   Rosacea    Ulcer    gastric ulcer, antritis-egd 09/06/08   Vitamin D deficiency     Family History  Problem Relation Age of Onset   Hyperlipidemia Mother    Osteoarthritis Mother    COPD Father    Colon cancer Maternal Aunt    Colon cancer Maternal Grandfather    Scoliosis Son    Heart disease Neg Hx    Breast cancer Neg Hx     Past Surgical History:  Procedure Laterality Date   BREAST CYST ASPIRATION Left 2005   BREAST SURGERY     benign breast nodules 2005   CHOLECYSTECTOMY     COLONOSCOPY  2008   Dr Medoff-Normal   DILATION AND CURETTAGE, DIAGNOSTIC / THERAPEUTIC     ESOPHAGOGASTRODUODENOSCOPY     FRACTURE SURGERY     MALONEY DILATION  02/08/2012   Procedure: MALONEY DILATION;  Surgeon: West Bali, MD;  Location: [facility];  Service: Endoscopy;  Laterality: N/A;   SAVORY DILATION  02/08/2012   Procedure: SAVORY DILATION;  Surgeon: West Bali, MD;  Location: [facility];  Service: Endoscopy;  Laterality: N/A;   SPINE SURGERY      Social History   Occupational History   Occupation: homemaker  Tobacco Use   Smoking status: Former    Current packs/day: 0.00    Types: Cigarettes    Quit date: 07/06/1988    Years since quitting: 34.8   Smokeless tobacco: Never   Tobacco comments:    intermittent for a few months  Vaping Use   Vaping status: Never Used  Substance and Sexual Activity  Alcohol use: Not Currently    Comment: occasional mixed drink couple per mo   Drug use: No   Sexual activity: Not on file

## 2023-05-23 IMAGING — DX DG KNEE 1-2V*R*
2 series · 2 of 2 positions shown · non-contrast
Comparison: X-ray 02/10/2019.

CLINICAL DATA: Right knee giving out.

EXAM:
RIGHT KNEE - 1-2 VIEW

[knee ap]
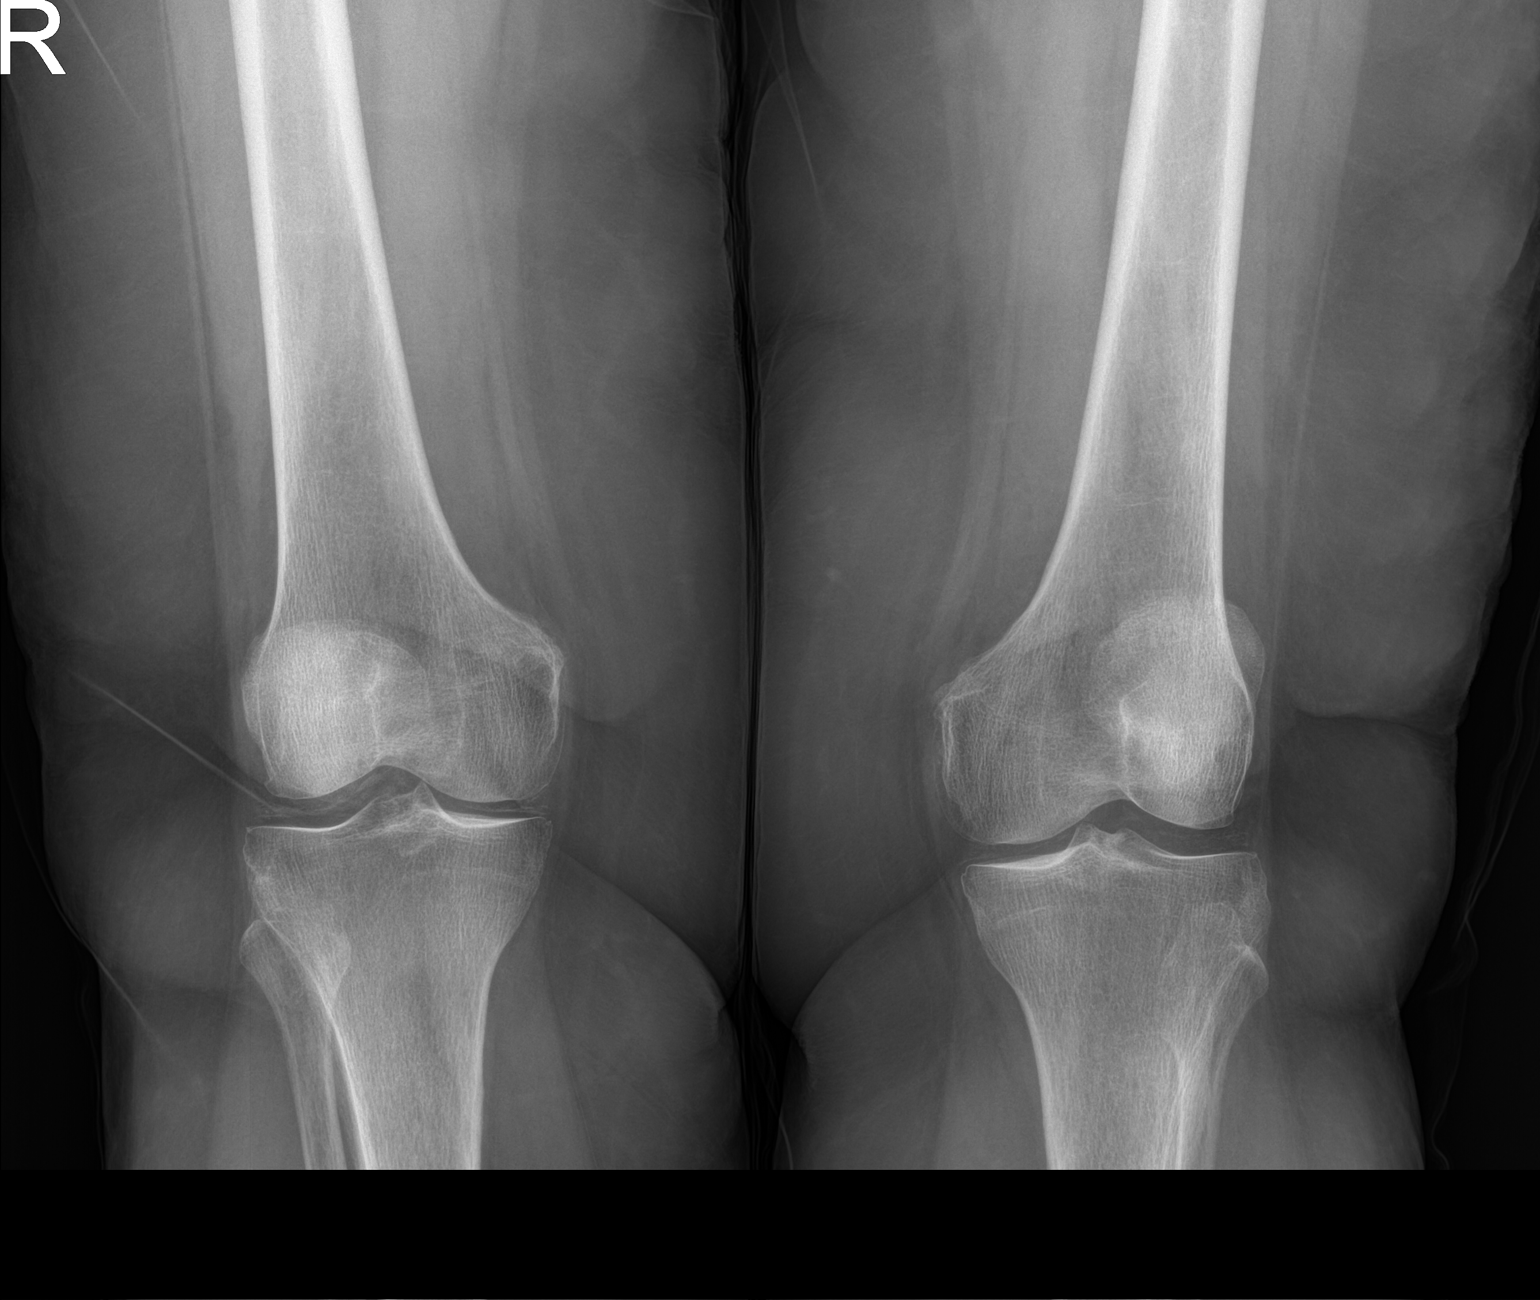

[knee lat]
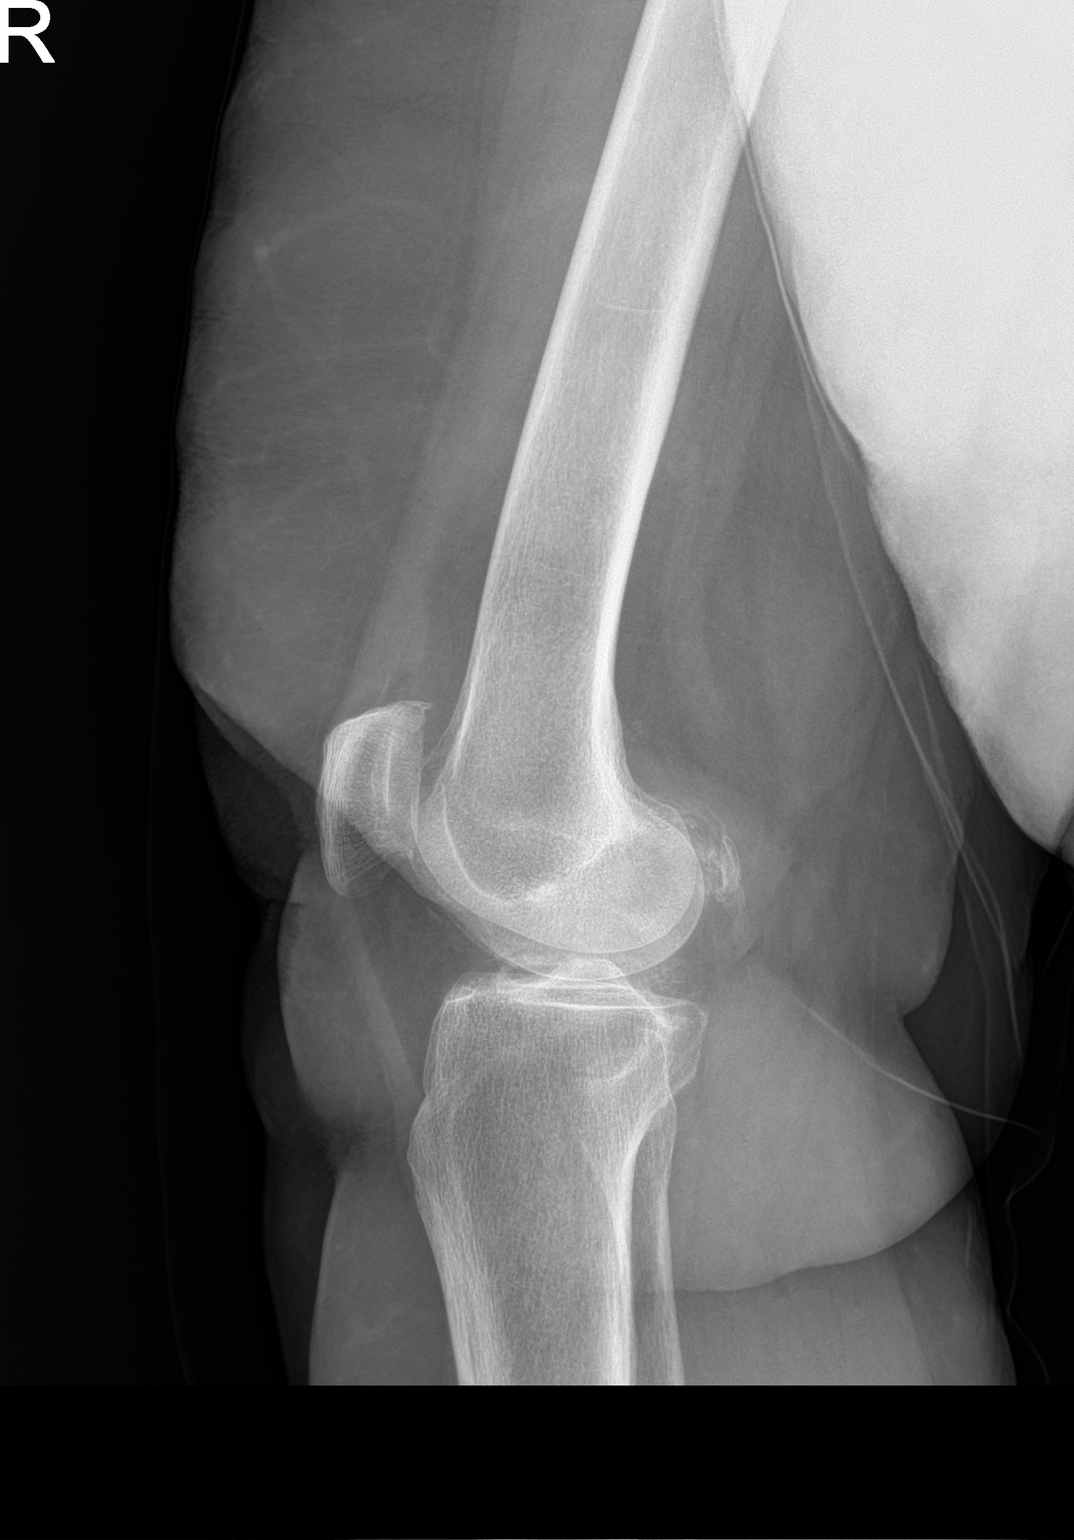

[2 of 2 positions shown; findings below may reference images not displayed]

FINDINGS: Right knee:

Mild medial compartment joint space narrowing. Medial and lateral
compartment chondrocalcinosis. Mild patellofemoral joint space
narrowing and peripheral osteophytosis. No joint effusion. No acute
fracture or dislocation.

On frontal view of the left knee there is minimal medial compartment
joint space narrowing and mild medial and lateral compartment
chondrocalcinosis.
IMPRESSION: Bilateral knee chondrocalcinosis. Mild right knee medial and
patellofemoral compartment osteoarthritis.

## 2023-06-10 ENCOUNTER — Telehealth: Payer: Self-pay | Admitting: Nurse Practitioner

## 2023-06-11 ENCOUNTER — Telehealth: Payer: Self-pay

## 2023-06-11 DIAGNOSIS — N1831 Chronic kidney disease, stage 3a: Secondary | ICD-10-CM

## 2023-06-11 NOTE — Telephone Encounter (Signed)
I think you usually do this

## 2023-06-11 NOTE — Telephone Encounter (Unsigned)
Copied from CRM [id number]. Topic: Clinical - Prescription Issue >> Jun 11, 2023  9:53 AM Tiffany H wrote: Reason for CRM: Patient called to request a refill of Farxiga. Patient has medication sent directly from AZ[employer]ME program. She received an email today from the program advising that Gennette Pac needs to send a new 90 day prescription to the manufacturer. Please assist.  dapagliflozin propanediol (FARXIGA) 10 MG TABS tablet

## 2023-06-15 ENCOUNTER — Other Ambulatory Visit: Payer: Self-pay | Admitting: *Deleted

## 2023-06-15 MED ORDER — ONETOUCH ULTRASOFT LANCETS MISC: 3 refills | Status: DC

## 2023-06-15 MED ORDER — DAPAGLIFLOZIN PROPANEDIOL 10 MG PO TABS: 10.0000 mg | ORAL_TABLET | Freq: Every day | ORAL | 5 refills | Status: DC

## 2023-06-15 NOTE — Telephone Encounter (Signed)
Refills for farxiga sent to [facility] for patient assistance

## 2023-06-15 NOTE — Telephone Encounter (Signed)
Copied from CRM [id number]. Topic: Clinical - Medication Refill >> Jun 15, 2023  9:45 AM Adelina Mings wrote: Most Recent Primary Care Visit:  Provider: Bennie Pierini  Department: [facility]  Visit Type: OFFICE VISIT  Date: 03/25/2023  Medication: Lancets (ONETOUCH ULTRASOFT) lancets  Has the patient contacted their pharmacy? No (Agent: If no, request that the patient contact the pharmacy for the refill. If patient does not wish to contact the pharmacy document the reason why and proceed with request.) (Agent: If yes, when and what did the pharmacy advise?)  Is this the correct pharmacy for this prescription? Yes If no, delete pharmacy and type the correct one.  This is the patient's preferred pharmacy:  [facility]) - [place], [place] - [address] [facility] [address] [place] [address] [place] [address] Phone: [phone] Fax: [phone]  [facility] - [place], [place] - [address] [place] [address] Phone: [phone] Fax: [phone]    Has the prescription been filled recently? No  Is the patient out of the medication? No  Has the patient been seen for an appointment in the last year OR does the patient have an upcoming appointment? Yes  Can we respond through MyChart? No  Agent: Please be advised that Rx refills may take up to 3 business days. We ask that you follow-up with your pharmacy.

## 2023-06-15 NOTE — Telephone Encounter (Signed)
Please advise, Last filled by Dr. Jeanice Lim in 2021

## 2023-06-16 ENCOUNTER — Telehealth: Payer: Self-pay | Admitting: Family Medicine

## 2023-06-16 MED ORDER — GLUCOSE BLOOD VI STRP: ORAL_STRIP | 3 refills | Status: DC

## 2023-06-16 NOTE — Telephone Encounter (Signed)
Copied from CRM [id number]. Topic: Clinical - Medication Refill >> Jun 16, 2023  9:17 AM Larwance Sachs wrote: Most Recent Primary Care Visit:  Provider: Bennie Pierini Department: [facility] Visit Type: OFFICE VISIT Date: 03/25/2023  Medication: glucose blood test strip   Has the patient contacted their pharmacy? Yes, wrong item sent over to Pharmacy  (Agent: If no, request that the patient contact the pharmacy for the refill. If patient does not wish to contact the pharmacy document the reason why and proceed with request.) (Agent: If yes, when and what did the pharmacy advise?)  Is this the correct pharmacy for this prescription? Yes If no, delete pharmacy and type the correct one.  This is the patient's preferred pharmacy:  [facility]) - [place], [place] - [address] [facility] [address] [place] [address] [place] [address] Phone: [phone] Fax: [phone]  [facility] - [place], [place] - [address] [place] [address] Phone: [phone] Fax: [phone]   Has the prescription been filled recently? No  Is the patient out of the medication? No  Has the patient been seen for an appointment in the last year OR does the patient have an upcoming appointment? Yes  Can we respond through MyChart? Yes  Patient does not need lancet that were sent to [facility], patient stated she vividly advised needs test strips. Will attempt to cancel lancet order   Agent: Please be advised that Rx refills may take up to 3 business days. We ask that you follow-up with your pharmacy.

## 2023-06-16 NOTE — Addendum Note (Signed)
Addended by: Hessie Diener on: 06/16/2023 12:58 PM   Modules accepted: Orders

## 2023-06-16 NOTE — Telephone Encounter (Signed)
I spoke to pt and verified it was the One touch ultra test strips and pt said yes. Rx sent to pharmacy and pt aware.

## 2023-06-17 ENCOUNTER — Telehealth: Payer: Self-pay | Admitting: Pharmacist

## 2023-06-17 NOTE — Telephone Encounter (Signed)
   Patient enrolled in the AZ&me patient assistance program for [place].  Updated RX escribed to [facility] (pharmacy for AZ&me patient assistance).  Patient is stable on current regimen.  She will need to re-enroll for 2025.  Please route me PAP if needed.  Kieth Brightly, PharmD, BCACP, CPP Clinical Pharmacist, [facility]

## 2023-07-01 DIAGNOSIS — N189 Chronic kidney disease, unspecified: Secondary | ICD-10-CM | POA: Diagnosis not present

## 2023-07-01 DIAGNOSIS — N1831 Chronic kidney disease, stage 3a: Secondary | ICD-10-CM | POA: Diagnosis not present

## 2023-07-05 DIAGNOSIS — N1831 Chronic kidney disease, stage 3a: Secondary | ICD-10-CM | POA: Diagnosis not present

## 2023-07-05 DIAGNOSIS — N2581 Secondary hyperparathyroidism of renal origin: Secondary | ICD-10-CM | POA: Diagnosis not present

## 2023-07-05 DIAGNOSIS — D631 Anemia in chronic kidney disease: Secondary | ICD-10-CM | POA: Diagnosis not present

## 2023-07-05 DIAGNOSIS — I129 Hypertensive chronic kidney disease with stage 1 through stage 4 chronic kidney disease, or unspecified chronic kidney disease: Secondary | ICD-10-CM | POA: Diagnosis not present

## 2023-07-05 DIAGNOSIS — N189 Chronic kidney disease, unspecified: Secondary | ICD-10-CM | POA: Diagnosis not present

## 2023-07-27 NOTE — Progress Notes (Unsigned)
Cardiology Office Note:  .   Date:  07/29/2023  ID:  Denise Meadows, DOB 14-Oct-1950, MRN [id number] PCP: Bennie Pierini, FNP  [facility] Providers Cardiologist:  None Cardiology APP:  Levi Aland, NP    Patient Profile: .      PMH Hypertension Hyperlipidemia Aortic atherosclerosis Type 2 DM NASH  Initially seen by Dr. Delton See 02/2019 for increasing dyspnea on exertion.  Coronary CTA 03/2019 showed minimal CAD with calcium score of 46 C66 percentile) TTE 07/2018 showed EF 60 to 65%, G2 DD, normal RV, no significant valve disease.  Seen by Dr. Shari Prows 11/12/2021.  She reported "annoying" pain just inferior to her left breast.  This occurs randomly and infrequently, usually while sitting on the couch.  It does not necessarily occur after she eats.  It does worsen when she lays down.  It has been ongoing since starting [place].  Of note, she endorses having a hernia.  She does not consistently monitor BP.  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  No changes were made to her treatment regimen and 46-month follow-up was recommended.       History of Present Illness: .   Denise Meadows is a very pleasant 73 y.o. female who is here today for overdue follow-up. She reports no new concerns since the last visit. We discussed her EKG today which reveals right bundle branch block and premature atrial contractions (PACs), both of which were seen on last EKG. She reports shortness of breath with exertion, such as climbing hills or stairs, which she attributes to her weight. This has been stable for a while. She denies orthopnea or PND. She had noticed some ankle swelling, which resolved after she stopped drinking homemade lemonade. History of back issues and a herniated disc, limiting her exercise to walking and housework. She reports a preference for sweets, but tries to limit her intake and drinks a lot of water. She has been on simvastatin for hyperlipidemia, but her LDL and  triglycerides have been slightly elevated. She also has a history of kidney disease, with a slightly elevated creatinine, but recent follow-up with nephrology was reassuring. She denies palpitations, presyncope or syncope. No interval hospitalizations.    Discussed the use of AI scribe software for clinical note transcription with the patient, who gave verbal consent to proceed.   ROS: See HPI       Studies Reviewed: Marland Kitchen   EKG Interpretation Date/Time:  Thursday July 29 2023 08:00:56 EST Ventricular Rate:  75 PR Interval:  150 QRS Duration:  114 QT Interval:  416 QTC Calculation: 464 R Axis:   -43  Text Interpretation: Sinus rhythm with occasional Premature ventricular complexes Left axis deviation Right bundle branch block No acute changes Confirmed by Eligha Bridegroom [phone]) on 07/29/2023 8:09:55 AM    Risk Assessment/Calculations:             Physical Exam:   VS:  BP 114/68   Pulse 75   Ht 5\' 1"  (1.549 m)   Wt 159 lb 9.6 oz (72.4 kg)   SpO2 95%   BMI 30.16 kg/m    Wt Readings from Last 3 Encounters:  07/29/23 159 lb 9.6 oz (72.4 kg)  05/20/23 161 lb 4 oz (73.1 kg)  03/25/23 160 lb (72.6 kg)    GEN: Well nourished, well developed in no acute distress NECK: No JVD; No carotid bruits CARDIAC: RRR, no murmurs, rubs, gallops RESPIRATORY:  Clear to auscultation without rales, wheezing or  rhonchi  ABDOMEN: Soft, non-tender, non-distended EXTREMITIES:  No edema; No deformity     ASSESSMENT AND PLAN: .    DOE/Diastolic dysfunction: Stable DOE with walking up stairs or inclines. We discussed previous finding of grade 2 diastolic dysfunction on echo in 2020. She denies orthopnea, PND, edema or weight gain. She follows a low sodium diet.  Admits she would likely benefit from weight loss.  BP is well-controlled. Recent nephrology appointment, stable renal function per pt. Continue Farxiga, metoprolol, ramipril.  CAD without angina: Coronary CTA 03/2019 revealed minimal  non-obstructive CAD. She has DOE as noted above but it has been stable. No chest pain or other symptoms concerning for angina. Change in statin therapy as noted below for goal LDL < 70.   Hyperlipidemia LDL goal < 70: Lipid profile 03/25/2023 with total cholesterol 164, HDL 46, LDL 85, and triglycerides 829. She has not tried other statins in the past, has been on simvastatin for some time. She is agreeable to stop simvastatin and start rosuvastatin 20 mg daily.  She is scheduled for annual labs with PCP March 2025.  I have asked her to notify me if there is any problems getting her labs completed.  Emphasized importance of LDL goal 70 or lower.  CKD Stage 3a: Recently seen by nephrologist. She reports kidney function was stable per Dr. Allena Katz   Hypertension: BP is well controlled. Renal function is stable. No medication changes today.        Disposition: 1 year with me  Signed, Eligha Bridegroom, NP-C

## 2023-07-29 ENCOUNTER — Ambulatory Visit: Payer: Medicare Other | Attending: Nurse Practitioner | Admitting: Nurse Practitioner

## 2023-07-29 ENCOUNTER — Encounter: Payer: Self-pay | Admitting: Nurse Practitioner

## 2023-07-29 VITALS — BP 114/68 | HR 75 | Ht 61.0 in | Wt 159.6 lb

## 2023-07-29 DIAGNOSIS — N1831 Chronic kidney disease, stage 3a: Secondary | ICD-10-CM

## 2023-07-29 DIAGNOSIS — R0609 Other forms of dyspnea: Secondary | ICD-10-CM

## 2023-07-29 DIAGNOSIS — E785 Hyperlipidemia, unspecified: Secondary | ICD-10-CM

## 2023-07-29 DIAGNOSIS — I5189 Other ill-defined heart diseases: Secondary | ICD-10-CM | POA: Diagnosis not present

## 2023-07-29 DIAGNOSIS — I2510 Atherosclerotic heart disease of native coronary artery without angina pectoris: Secondary | ICD-10-CM | POA: Diagnosis not present

## 2023-07-29 DIAGNOSIS — I10 Essential (primary) hypertension: Secondary | ICD-10-CM

## 2023-07-29 MED ORDER — ROSUVASTATIN CALCIUM 20 MG PO TABS: 20.0000 mg | ORAL_TABLET | Freq: Every day | ORAL | 11 refills | Status: DC

## 2023-07-29 NOTE — Patient Instructions (Signed)
Medication Instructions:   DISCONTINUE Simvastatin  START Rosuvastatin one (1) tablet by mouth ( 20 mg) daily.   *If you need a refill on your cardiac medications before your next appointment, please call your pharmacy*   Lab Work:  Patient will get labs with PCP.  If you have labs (blood work) drawn today and your tests are completely normal, you will receive your results only by: MyChart Message (if you have MyChart) OR A paper copy in the mail If you have any lab test that is abnormal or we need to change your treatment, we will call you to review the results.   Testing/Procedures:  None ordered.   Follow-Up: At [facility], you and your health needs are our priority.  As part of our continuing mission to provide you with exceptional heart care, we have created designated Provider Care Teams.  These Care Teams include your primary Cardiologist (physician) and Advanced Practice Providers (APPs -  Physician Assistants and Nurse Practitioners) who all work together to provide you with the care you need, when you need it.  We recommend signing up for the patient portal called "MyChart".  Sign up information is provided on this After Visit Summary.  MyChart is used to connect with patients for Virtual Visits (Telemedicine).  Patients are able to view lab/test results, encounter notes, upcoming appointments, etc.  Non-urgent messages can be sent to your provider as well.   To learn more about what you can do with MyChart, go to [email or web address].    Your next appointment:   1 year(s)  Provider:   Eligha Bridegroom, NP    Other Instructions  Your physician wants you to follow-up in: 1 year.  You will receive a reminder letter in the mail two months in advance. If you don't receive a letter, please call our office to schedule the follow-up appointment.  GOALS: LDL (bad cholesterol) 70 or below  Adopting a Healthy Lifestyle.   Weight: Know what a healthy weight is  for you (roughly BMI <25) and aim to maintain this. You can calculate your body mass index on your smart phone. Unfortunately, this is not the most accurate measure of healthy weight, but it is the simplest measurement to use. A more accurate measurement involves body scanning which measures lean muscle, fat tissue and bony density. We do not have this equipment at [facility].    Diet: Aim for 7+ servings of fruits and vegetables daily Limit animal fats in diet for cholesterol and heart health - choose grass fed whenever available Avoid highly processed foods (fast food burgers, tacos, fried chicken, pizza, hot dogs, french fries)  Saturated fat comes in the form of butter, lard, coconut oil, margarine, partially hydrogenated oils, and fat in meat. These increase your risk of cardiovascular disease.  Use healthy plant oils, such as olive, canola, soy, corn, sunflower and peanut.  Whole foods such as fruits, vegetables and whole grains have fiber  Men need > 38 grams of fiber per day Women need > 25 grams of fiber per day  Load up on vegetables and fruits - one-half of your plate: Aim for color and variety, and remember that potatoes dont count. Go for whole grains - one-quarter of your plate: Whole wheat, barley, wheat berries, quinoa, oats, brown rice, and foods made with them. If you want pasta, go with whole wheat pasta. Protein power - one-quarter of your plate: Fish, chicken, beans, and nuts are all healthy, versatile protein sources. Limit red meat. You  need carbohydrates for energy! The type of carbohydrate is more important than the amount. Choose carbohydrates such as vegetables, fruits, whole grains, beans, and nuts in the place of white rice, white pasta, potatoes (baked or fried), macaroni and cheese, cakes, cookies, and donuts.  If youre thirsty, drink water. Coffee and tea are good in moderation, but skip sugary drinks and limit milk and dairy products to one or two daily servings. Keep  sugar intake at 6 teaspoons or 24 grams or LESS       Exercise: Aim for 150 min of moderate intensity exercise weekly for heart health, and weights twice weekly for bone health Stay active - any steps are better than no steps! Aim for 7-9 hours of sleep daily

## 2023-08-04 DIAGNOSIS — M4317 Spondylolisthesis, lumbosacral region: Secondary | ICD-10-CM | POA: Diagnosis not present

## 2023-08-04 DIAGNOSIS — M5416 Radiculopathy, lumbar region: Secondary | ICD-10-CM | POA: Diagnosis not present

## 2023-09-01 DIAGNOSIS — M5416 Radiculopathy, lumbar region: Secondary | ICD-10-CM | POA: Diagnosis not present

## 2023-09-21 ENCOUNTER — Encounter: Payer: Self-pay | Admitting: Nurse Practitioner

## 2023-09-21 ENCOUNTER — Ambulatory Visit: Payer: Medicare Other | Admitting: Nurse Practitioner

## 2023-09-21 VITALS — BP 119/67 | HR 69 | Temp 97.3°F | Ht 61.0 in | Wt 158.8 lb

## 2023-09-21 DIAGNOSIS — I10 Essential (primary) hypertension: Secondary | ICD-10-CM | POA: Diagnosis not present

## 2023-09-21 DIAGNOSIS — M810 Age-related osteoporosis without current pathological fracture: Secondary | ICD-10-CM | POA: Diagnosis not present

## 2023-09-21 DIAGNOSIS — E1169 Type 2 diabetes mellitus with other specified complication: Secondary | ICD-10-CM

## 2023-09-21 DIAGNOSIS — N1831 Chronic kidney disease, stage 3a: Secondary | ICD-10-CM

## 2023-09-21 DIAGNOSIS — Z23 Encounter for immunization: Secondary | ICD-10-CM

## 2023-09-21 DIAGNOSIS — E559 Vitamin D deficiency, unspecified: Secondary | ICD-10-CM

## 2023-09-21 DIAGNOSIS — K219 Gastro-esophageal reflux disease without esophagitis: Secondary | ICD-10-CM | POA: Diagnosis not present

## 2023-09-21 DIAGNOSIS — Z0001 Encounter for general adult medical examination with abnormal findings: Secondary | ICD-10-CM | POA: Diagnosis not present

## 2023-09-21 DIAGNOSIS — Z7984 Long term (current) use of oral hypoglycemic drugs: Secondary | ICD-10-CM

## 2023-09-21 DIAGNOSIS — I700 Atherosclerosis of aorta: Secondary | ICD-10-CM | POA: Diagnosis not present

## 2023-09-21 DIAGNOSIS — G4700 Insomnia, unspecified: Secondary | ICD-10-CM

## 2023-09-21 DIAGNOSIS — E119 Type 2 diabetes mellitus without complications: Secondary | ICD-10-CM | POA: Diagnosis not present

## 2023-09-21 DIAGNOSIS — Z6830 Body mass index (BMI) 30.0-30.9, adult: Secondary | ICD-10-CM

## 2023-09-21 DIAGNOSIS — L719 Rosacea, unspecified: Secondary | ICD-10-CM

## 2023-09-21 DIAGNOSIS — F410 Panic disorder [episodic paroxysmal anxiety] without agoraphobia: Secondary | ICD-10-CM

## 2023-09-21 DIAGNOSIS — E785 Hyperlipidemia, unspecified: Secondary | ICD-10-CM | POA: Diagnosis not present

## 2023-09-21 LAB — CBC WITH DIFFERENTIAL/PLATELET
Basophils Absolute: 0.1 10*3/uL (ref 0.0–0.2)
Basos: 1 %
EOS (ABSOLUTE): 0.3 10*3/uL (ref 0.0–0.4)
Eos: 4 %
Hematocrit: 43.3 % (ref 34.0–46.6)
Hemoglobin: 14.4 g/dL (ref 11.1–15.9)
Immature Grans (Abs): 0 10*3/uL (ref 0.0–0.1)
Immature Granulocytes: 0 %
Lymphocytes Absolute: 2 10*3/uL (ref 0.7–3.1)
Lymphs: 31 %
MCH: 31.3 pg (ref 26.6–33.0)
MCHC: 33.3 g/dL (ref 31.5–35.7)
MCV: 94 fL (ref 79–97)
Monocytes Absolute: 0.9 10*3/uL (ref 0.1–0.9)
Monocytes: 13 %
Neutrophils Absolute: 3.3 10*3/uL (ref 1.4–7.0)
Neutrophils: 51 %
Platelets: 285 10*3/uL (ref 150–450)
RBC: 4.6 x10E6/uL (ref 3.77–5.28)
RDW: 12.4 % (ref 11.7–15.4)
WBC: 6.6 10*3/uL (ref 3.4–10.8)

## 2023-09-21 LAB — CMP14+EGFR
ALT: 14 IU/L (ref 0–32)
AST: 17 IU/L (ref 0–40)
Albumin: 4.4 g/dL (ref 3.8–4.8)
Alkaline Phosphatase: 95 IU/L (ref 44–121)
BUN/Creatinine Ratio: 20 (ref 12–28)
BUN: 23 mg/dL (ref 8–27)
Bilirubin Total: 0.5 mg/dL (ref 0.0–1.2)
CO2: 24 mmol/L (ref 20–29)
Calcium: 9.8 mg/dL (ref 8.7–10.3)
Chloride: 100 mmol/L (ref 96–106)
Creatinine, Ser: 1.16 mg/dL — ABNORMAL HIGH (ref 0.57–1.00)
Globulin, Total: 2.2 g/dL (ref 1.5–4.5)
Glucose: 173 mg/dL — ABNORMAL HIGH (ref 70–99)
Potassium: 4.7 mmol/L (ref 3.5–5.2)
Sodium: 140 mmol/L (ref 134–144)
Total Protein: 6.6 g/dL (ref 6.0–8.5)
eGFR: 50 mL/min/{1.73_m2} — ABNORMAL LOW (ref >59)

## 2023-09-21 LAB — LIPID PANEL
Chol/HDL Ratio: 3.2 ratio (ref 0.0–4.4)
Cholesterol, Total: 148 mg/dL (ref 100–199)
HDL: 46 mg/dL (ref >39)
LDL Chol Calc (NIH): 62 mg/dL (ref 0–99)
Triglycerides: 248 mg/dL — ABNORMAL HIGH (ref 0–149)
VLDL Cholesterol Cal: 40 mg/dL (ref 5–40)

## 2023-09-21 LAB — BAYER DCA HB A1C WAIVED: HB A1C (BAYER DCA - WAIVED): 8 % — ABNORMAL HIGH (ref 4.8–5.6)

## 2023-09-21 MED ORDER — METFORMIN HCL 1000 MG PO TABS: 1000.0000 mg | ORAL_TABLET | Freq: Every day | ORAL | 1 refills | Status: DC

## 2023-09-21 MED ORDER — METOPROLOL SUCCINATE ER 25 MG PO TB24: 25.0000 mg | ORAL_TABLET | Freq: Every day | ORAL | 1 refills | Status: DC

## 2023-09-21 MED ORDER — OMEPRAZOLE 20 MG PO CPDR: 20.0000 mg | DELAYED_RELEASE_CAPSULE | Freq: Every day | ORAL | 1 refills | Status: DC | PRN

## 2023-09-21 MED ORDER — RAMIPRIL 10 MG PO CAPS: 10.0000 mg | ORAL_CAPSULE | Freq: Every day | ORAL | 1 refills | Status: DC

## 2023-09-21 NOTE — Progress Notes (Signed)
 Subjective:    Patient ID: Denise Meadows, female    DOB: March 24, 1951, 73 y.o.   MRN: [id number]   Chief Complaint: annual physical    HPI:  Denise Meadows is a 73 y.o. who identifies as a female who was assigned female at birth.   Social history: Lives with: husband Work history: retired- does a lot of traveling   Comes in today for follow up of the following chronic medical issues:  1. Annual physical   2. Primary hypertension No c/o chest pain, sob or headache. Does not check blood pressure at home. BP Readings from Last 3 Encounters:  07/29/23 114/68  05/20/23 125/70  03/25/23 128/64     3. Aortic atherosclerosis (HCC) Is doing well. Doe snot see cardiology.  4. Gastroesophageal reflux disease, unspecified whether esophagitis present Takes omeprazole as needed  5. Hyperlipidemia associated with type 2 diabetes mellitus (HCC) Does try to watch diet but does no dedicated exercise. Lab Results  Component Value Date   CHOL 164 03/25/2023   HDL 46 03/25/2023   LDLCALC 85 03/25/2023   TRIG 196 (H) 03/25/2023   CHOLHDL 3.6 03/25/2023     6. Diabetes mellitus treated with oral medication (HCC) Does not check blood sugars at home Lab Results  Component Value Date   HGBA1C 7.3 (H) 03/25/2023     7. Stage 3a chronic kidney disease (HCC) No voiding issues Lab Results  Component Value Date   CREATININE 1.25 (H) 03/25/2023     8. Panic attacks Is on  valium and is doing well.    09/22/2022    8:12 AM 04/20/2022    9:02 AM 01/14/2022    3:48 PM 09/11/2021    8:07 AM  GAD 7 : Generalized Anxiety Score  Nervous, Anxious, on Edge 0 0 0 0  Control/stop worrying 0 0 0 0  Worry too much - different things 0 0 0 0  Trouble relaxing 0 0 0 0  Restless 0 0 0 0  Easily annoyed or irritable 0 0 0 0  Afraid - awful might happen 0 0 0 0  Total GAD 7 Score 0 0 0 0  Anxiety Difficulty Not difficult at all Not difficult at all Not difficult at all Not difficult at  all       9. Insomnia, unspecified type No issues  10. Rosacea Is on metrogel and that is working well for her.  11. Vitamin D deficiency Is on daily vitamin d supplement  12. Osteoporosis Last dexascan was done on 03/26/23. T score was -1.7. does not do any weight bearing exercises.   13. BMI 30.0-30.9,adult No recent weight changes  Wt Readings from Last 3 Encounters:  09/21/23 158 lb 12.8 oz (72 kg)  07/29/23 159 lb 9.6 oz (72.4 kg)  05/20/23 161 lb 4 oz (73.1 kg)   BMI Readings from Last 3 Encounters:  09/21/23 30.00 kg/m  07/29/23 30.16 kg/m  05/20/23 30.47 kg/m       New complaints: None today  Allergies  Allergen Reactions   Acetaminophen Nausea And Vomiting   Codeine Nausea And Vomiting   Penicillins Rash   Outpatient Encounter Medications as of 09/21/2023  Medication Sig   Cholecalciferol (VITAMIN D) 2000 units tablet Take 1 tablet (2,000 Units total) by mouth daily.   dapagliflozin propanediol (FARXIGA) 10 MG TABS tablet Take 1 tablet (10 mg total) by mouth daily before breakfast.   diazepam (VALIUM) 2 MG tablet Take 1 tablet (2 mg total)  by mouth every 6 (six) hours as needed for anxiety.   glucose blood test strip Please dispense as OneTouch Ultra 2. Use as directed to monitor FSBS 1x weekly. Dx: E11.9.   Lancets (ONETOUCH ULTRASOFT) lancets Use as directed to monitor FSBS 1x weekly. Dx: E11.9.   magnesium gluconate (MAGONATE) 500 MG tablet Take 400 mg by mouth 2 (two) times daily.   metFORMIN (GLUCOPHAGE) 1000 MG tablet Take 1 tablet (1,000 mg total) by mouth daily with breakfast.   metoprolol succinate (TOPROL-XL) 25 MG 24 hr tablet Take 1 tablet (25 mg total) by mouth daily.   metroNIDAZOLE (METROGEL) 0.75 % gel Apply 1 application topically as needed.   Multiple Vitamin (MULTIVITAMIN WITH MINERALS) TABS tablet Take 1 tablet by mouth daily.   Omega-3 Fatty Acids (FISH OIL) 1000 MG CAPS Take 1 capsule (1,000 mg total) by mouth 2 (two) times  daily.   omeprazole (PRILOSEC) 20 MG capsule Take 1 capsule (20 mg total) by mouth daily as needed.   ramipril (ALTACE) 10 MG capsule Take 1 capsule (10 mg total) by mouth daily.   rosuvastatin (CRESTOR) 20 MG tablet Take 1 tablet (20 mg total) by mouth daily.   vitamin B-12 (CYANOCOBALAMIN) 100 MCG tablet Take 1 tablet (100 mcg total) by mouth daily.   No facility-administered encounter medications on file as of 09/21/2023.    Past Surgical History:  Procedure Laterality Date   BREAST CYST ASPIRATION Left 2005   BREAST SURGERY     benign breast nodules 2005   CHOLECYSTECTOMY     COLONOSCOPY  2008   Dr Medoff-Normal   DILATION AND CURETTAGE, DIAGNOSTIC / THERAPEUTIC     ESOPHAGOGASTRODUODENOSCOPY     FRACTURE SURGERY     MALONEY DILATION  02/08/2012   Procedure: MALONEY DILATION;  Surgeon: West Bali, MD;  Location: [facility];  Service: Endoscopy;  Laterality: N/A;   SAVORY DILATION  02/08/2012   Procedure: SAVORY DILATION;  Surgeon: West Bali, MD;  Location: [facility];  Service: Endoscopy;  Laterality: N/A;   SPINE SURGERY      Family History  Problem Relation Age of Onset   Hyperlipidemia Mother    Osteoarthritis Mother    COPD Father    Colon cancer Maternal Aunt    Colon cancer Maternal Grandfather    Scoliosis Son    Heart disease Neg Hx    Breast cancer Neg Hx       Controlled substance contract: n/a     Review of Systems  Constitutional:  Negative for diaphoresis.  Eyes:  Negative for pain.  Respiratory:  Negative for shortness of breath.   Cardiovascular:  Negative for chest pain, palpitations and leg swelling.  Gastrointestinal:  Negative for abdominal pain.  Endocrine: Negative for polydipsia.  Skin:  Negative for rash.  Neurological:  Negative for dizziness, weakness and headaches.  Hematological:  Does not bruise/bleed easily.  All other systems reviewed and are negative.      Objective:   Physical Exam Vitals and nursing note  reviewed.  Constitutional:      General: She is not in acute distress.    Appearance: Normal appearance. She is well-developed.  HENT:     Head: Normocephalic.     Right Ear: Tympanic membrane normal.     Left Ear: Tympanic membrane normal.     Nose: Nose normal.     Mouth/Throat:     Mouth: Mucous membranes are moist.  Eyes:     Pupils: Pupils  are equal, round, and reactive to light.  Neck:     Vascular: No carotid bruit or JVD.  Cardiovascular:     Rate and Rhythm: Normal rate and regular rhythm.     Heart sounds: Normal heart sounds.  Pulmonary:     Effort: Pulmonary effort is normal. No respiratory distress.     Breath sounds: Normal breath sounds. No wheezing or rales.  Chest:     Chest wall: No tenderness.  Abdominal:     General: Bowel sounds are normal. There is no distension or abdominal bruit.     Palpations: Abdomen is soft. There is no hepatomegaly, splenomegaly, mass or pulsatile mass.     Tenderness: There is no abdominal tenderness.  Musculoskeletal:        General: Normal range of motion.     Cervical back: Normal range of motion and neck supple.  Lymphadenopathy:     Cervical: No cervical adenopathy.  Skin:    General: Skin is warm and dry.  Neurological:     Mental Status: She is alert and oriented to person, place, and time.     Deep Tendon Reflexes: Reflexes are normal and symmetric.  Psychiatric:        Behavior: Behavior normal.        Thought Content: Thought content normal.        Judgment: Judgment normal.    BP 119/67   Pulse 69   Temp (!) 97.3 F (36.3 C)   Ht 5\' 1"  (1.549 m)   Wt 158 lb 12.8 oz (72 kg)   SpO2 99%   BMI 30.00 kg/m     HGBA1c 8.0%     Assessment & Plan:  CHASSITY LUDKE comes in today with chief complaint of medical management of chronic issues    Diagnosis and orders addressed:  1. Primary hypertension Low sodium diet - CBC with Differential/Platelet - CMP14+EGFR - metoprolol succinate (TOPROL-XL) 25 MG 24  hr tablet; Take 1 tablet (25 mg total) by mouth daily.  Dispense: 100 tablet; Refill: 1 - ramipril (ALTACE) 10 MG capsule; Take 1 capsule (10 mg total) by mouth daily.  Dispense: 100 capsule; Refill: 1  2. Aortic atherosclerosis (HCC)  3. Gastroesophageal reflux disease, unspecified whether esophagitis present Avoid spicy foods Do not eat 2 hours prior to bedtime  - omeprazole (PRILOSEC) 20 MG capsule; Take 1 capsule (20 mg total) by mouth daily as needed.  Dispense: 100 capsule; Refill: 1  4. Hyperlipidemia associated with type 2 diabetes mellitus (HCC) Low fat diet - Lipid panel - simvastatin (ZOCOR) 20 MG tablet; Take 1 tablet (20 mg total) by mouth at bedtime.  Dispense: 100 tablet; Refill: 1  5. Diabetes mellitus treated with oral medication (HCC) Continue to watch carbs in diet - Bayer DCA Hb A1c Waived - metoprolol succinate (TOPROL-XL) 25 MG 24 hr tablet; Take 1 tablet (25 mg total) by mouth daily.  Dispense: 100 tablet; Refill: 1 - metFORMIN (GLUCOPHAGE) 1000 MG tablet; Take 1 tablet (1,000 mg total) by mouth daily with breakfast.  Dispense: 100 tablet; Refill: 1  6. Stage 3a chronic kidney disease (HCC) Labs pending - dapagliflozin propanediol (FARXIGA) 10 MG TABS tablet; Take 1 tablet (10 mg total) by mouth daily before breakfast.  Dispense: 90 tablet; Refill: 1  7. Panic attacks - diazepam (VALIUM) 2 MG tablet; Take 1 tablet (2 mg total) by mouth every 6 (six) hours as needed for anxiety.  Dispense: 20 tablet; Refill: 1  8. Insomnia, unspecified type  Bedtime routine  9. Rosacea  10. Vitamin D deficiency Continue vitamin d supplement  11. BMI 30.0-30.9,adult Discussed diet and exercise for person with BMI >25 Will recheck weight in 3-6 months    Labs pending Health Maintenance reviewed Diet and exercise encouraged  Follow up plan: 6 months   Mary-Margaret Daphine Deutscher, FNP

## 2023-09-23 DIAGNOSIS — M5416 Radiculopathy, lumbar region: Secondary | ICD-10-CM | POA: Diagnosis not present

## 2023-09-23 NOTE — Addendum Note (Signed)
 Addended by: Bennie Pierini on: 09/23/2023 04:36 PM   Modules accepted: Level of Service

## 2023-09-23 NOTE — Progress Notes (Signed)
Corrected billing

## 2023-09-27 ENCOUNTER — Other Ambulatory Visit (HOSPITAL_COMMUNITY): Payer: Self-pay

## 2023-09-27 DIAGNOSIS — M4317 Spondylolisthesis, lumbosacral region: Secondary | ICD-10-CM

## 2023-09-27 DIAGNOSIS — M5416 Radiculopathy, lumbar region: Secondary | ICD-10-CM

## 2023-10-05 ENCOUNTER — Ambulatory Visit (HOSPITAL_COMMUNITY)
Admission: RE | Admit: 2023-10-05 | Discharge: 2023-10-05 | Disposition: A | Discharge status: Home / Self Care | Source: Ambulatory Visit

## 2023-10-05 DIAGNOSIS — M48061 Spinal stenosis, lumbar region without neurogenic claudication: Secondary | ICD-10-CM | POA: Diagnosis not present

## 2023-10-05 DIAGNOSIS — M5416 Radiculopathy, lumbar region: Secondary | ICD-10-CM | POA: Insufficient documentation

## 2023-10-05 DIAGNOSIS — M4317 Spondylolisthesis, lumbosacral region: Secondary | ICD-10-CM | POA: Diagnosis not present

## 2023-10-05 DIAGNOSIS — M4807 Spinal stenosis, lumbosacral region: Secondary | ICD-10-CM | POA: Diagnosis not present

## 2023-10-05 DIAGNOSIS — M5136 Other intervertebral disc degeneration, lumbar region with discogenic back pain only: Secondary | ICD-10-CM | POA: Diagnosis not present

## 2023-10-05 DIAGNOSIS — M5126 Other intervertebral disc displacement, lumbar region: Secondary | ICD-10-CM | POA: Diagnosis not present

## 2023-10-07 ENCOUNTER — Telehealth: Payer: Self-pay | Admitting: Nurse Practitioner

## 2023-10-21 DIAGNOSIS — M5416 Radiculopathy, lumbar region: Secondary | ICD-10-CM | POA: Diagnosis not present

## 2023-10-25 ENCOUNTER — Ambulatory Visit

## 2023-10-25 NOTE — Telephone Encounter (Signed)
 Please schedule patient a diabetic eye exam

## 2023-10-25 NOTE — Telephone Encounter (Signed)
 No openings for DM eye exam. Will make note of this, so we can call pt to schedule appt once we have more openings.

## 2023-10-25 NOTE — Telephone Encounter (Signed)
 Second CRM sent to the wrong facility, Please make sure you are paying attention to where you are sending these.    Copied from CRM [id number]. Topic: Appointments - Scheduling Inquiry for Clinic >> Oct 25, 2023  8:08 AM Denise Meadows wrote: Reason for CRM: Needs assistance scheduling diabetic eye exam.

## 2023-11-22 DIAGNOSIS — M5416 Radiculopathy, lumbar region: Secondary | ICD-10-CM | POA: Diagnosis not present

## 2023-12-06 DIAGNOSIS — M4316 Spondylolisthesis, lumbar region: Secondary | ICD-10-CM | POA: Diagnosis not present

## 2023-12-06 DIAGNOSIS — M5416 Radiculopathy, lumbar region: Secondary | ICD-10-CM | POA: Diagnosis not present

## 2023-12-23 ENCOUNTER — Ambulatory Visit: Admitting: Nurse Practitioner

## 2024-01-04 ENCOUNTER — Encounter: Payer: Self-pay | Admitting: Nurse Practitioner

## 2024-01-04 ENCOUNTER — Ambulatory Visit: Admitting: Nurse Practitioner

## 2024-01-04 VITALS — BP 136/66 | HR 72 | Temp 97.3°F | Ht 61.0 in | Wt 156.0 lb

## 2024-01-04 DIAGNOSIS — L570 Actinic keratosis: Secondary | ICD-10-CM

## 2024-01-04 DIAGNOSIS — I700 Atherosclerosis of aorta: Secondary | ICD-10-CM

## 2024-01-04 DIAGNOSIS — E119 Type 2 diabetes mellitus without complications: Secondary | ICD-10-CM | POA: Diagnosis not present

## 2024-01-04 DIAGNOSIS — Z6830 Body mass index (BMI) 30.0-30.9, adult: Secondary | ICD-10-CM

## 2024-01-04 DIAGNOSIS — I10 Essential (primary) hypertension: Secondary | ICD-10-CM

## 2024-01-04 DIAGNOSIS — E785 Hyperlipidemia, unspecified: Secondary | ICD-10-CM | POA: Diagnosis not present

## 2024-01-04 DIAGNOSIS — E1169 Type 2 diabetes mellitus with other specified complication: Secondary | ICD-10-CM | POA: Diagnosis not present

## 2024-01-04 DIAGNOSIS — Z7984 Long term (current) use of oral hypoglycemic drugs: Secondary | ICD-10-CM

## 2024-01-04 DIAGNOSIS — K219 Gastro-esophageal reflux disease without esophagitis: Secondary | ICD-10-CM

## 2024-01-04 DIAGNOSIS — L719 Rosacea, unspecified: Secondary | ICD-10-CM | POA: Diagnosis not present

## 2024-01-04 DIAGNOSIS — E559 Vitamin D deficiency, unspecified: Secondary | ICD-10-CM

## 2024-01-04 DIAGNOSIS — N1831 Chronic kidney disease, stage 3a: Secondary | ICD-10-CM

## 2024-01-04 DIAGNOSIS — M810 Age-related osteoporosis without current pathological fracture: Secondary | ICD-10-CM

## 2024-01-04 DIAGNOSIS — G4700 Insomnia, unspecified: Secondary | ICD-10-CM

## 2024-01-04 DIAGNOSIS — F410 Panic disorder [episodic paroxysmal anxiety] without agoraphobia: Secondary | ICD-10-CM

## 2024-01-04 LAB — LIPID PANEL

## 2024-01-04 LAB — BAYER DCA HB A1C WAIVED: HB A1C (BAYER DCA - WAIVED): 7.6 % — ABNORMAL HIGH (ref 4.8–5.6)

## 2024-01-04 MED ORDER — OMEPRAZOLE 20 MG PO CPDR: 20.0000 mg | DELAYED_RELEASE_CAPSULE | Freq: Every day | ORAL | 1 refills | Status: DC | PRN

## 2024-01-04 MED ORDER — DIAZEPAM 2 MG PO TABS: 2.0000 mg | ORAL_TABLET | Freq: Four times a day (QID) | ORAL | 1 refills | Status: DC | PRN

## 2024-01-04 MED ORDER — METOPROLOL SUCCINATE ER 25 MG PO TB24
25.0000 mg | ORAL_TABLET | Freq: Every day | ORAL | 1 refills | Status: AC
Start: 2024-01-04 — End: ?

## 2024-01-04 MED ORDER — RAMIPRIL 10 MG PO CAPS: 10.0000 mg | ORAL_CAPSULE | Freq: Every day | ORAL | 1 refills | Status: AC

## 2024-01-04 MED ORDER — METFORMIN HCL 1000 MG PO TABS: 1000.0000 mg | ORAL_TABLET | Freq: Every day | ORAL | 1 refills | Status: DC

## 2024-01-04 MED ORDER — OMEPRAZOLE 40 MG PO CPDR
40.0000 mg | DELAYED_RELEASE_CAPSULE | Freq: Every day | ORAL | 1 refills | Status: DC
Start: 2024-01-04 — End: 2024-03-13

## 2024-01-04 NOTE — Progress Notes (Signed)
 Subjective:    Patient ID: Denise Meadows, female    DOB: [birth year], 73 y.o.   MRN: [id number]   Chief Complaint: medical management of chronic issues      HPI:  Denise Meadows is a 73 y.o. who identifies as a female who was assigned female at birth.   Social history: Lives with: husband Work history: retired- does a lot of traveling   Comes in today for follow up of the following chronic medical issues:  1. Primary hypertension No c/o chest pain, sob or headache. Does not check blood pressure at home. BP Readings from Last 3 Encounters:  09/21/23 119/67  07/29/23 114/68  05/20/23 125/70     2. Aortic atherosclerosis (HCC) Is doing well. Doe snot see cardiology.  3. Gastroesophageal reflux disease, unspecified whether esophagitis present Takes omeprazole  as needed  4. Hyperlipidemia associated with type 2 diabetes mellitus (HCC) Does try to watch diet but does no dedicated exercise. Lab Results  Component Value Date   CHOL 148 09/21/2023   HDL 46 09/21/2023   LDLCALC 62 09/21/2023   TRIG 248 (H) 09/21/2023   CHOLHDL 3.2 09/21/2023     5. Diabetes mellitus treated with oral medication (HCC) Does not check blood sugars at home Lab Results  Component Value Date   HGBA1C 8.0 (H) 09/21/2023     6. Stage 3a chronic kidney disease (HCC) No voiding issues Lab Results  Component Value Date   CREATININE 1.16 (H) 09/21/2023     7. Panic attacks Is on  valium  and is doing well.    01/04/2024    9:51 AM 09/22/2022    8:12 AM 04/20/2022    9:02 AM 01/14/2022    3:48 PM  GAD 7 : Generalized Anxiety Score  Nervous, Anxious, on Edge 0 0 0 0  Control/stop worrying 0 0 0 0  Worry too much - different things 0 0 0 0  Trouble relaxing 0 0 0 0  Restless 0 0 0 0  Easily annoyed or irritable 0 0 0 0  Afraid - awful might happen 0 0 0 0  Total GAD 7 Score 0 0 0 0  Anxiety Difficulty Not difficult at all Not difficult at all Not difficult at all Not difficult at  all        8.  Insomnia, unspecified type No issues  9. Rosacea Is on metrogel  and that is working well for her.  10. Vitamin D  deficiency Is on daily vitamin d  supplement  11. Osteoporosis Last dexascan was done on 03/26/23. T score was -1.7. does not do any weight bearing exercises.   12. BMI 30.0-30.9,adult No recent weight changes  Wt Readings from Last 3 Encounters:  01/04/24 156 lb (70.8 kg)  09/21/23 158 lb 12.8 oz (72 kg)  07/29/23 159 lb 9.6 oz (72.4 kg)   BMI Readings from Last 3 Encounters:  01/04/24 29.48 kg/m  09/21/23 30.00 kg/m  07/29/23 30.16 kg/m        New complaints: None today  Allergies  Allergen Reactions   Acetaminophen Nausea And Vomiting   Codeine Nausea And Vomiting   Penicillins Rash   Outpatient Encounter Medications as of 01/04/2024  Medication Sig   Cholecalciferol (VITAMIN D ) 2000 units tablet Take 1 tablet (2,000 Units total) by mouth daily.   dapagliflozin  propanediol (FARXIGA ) 10 MG TABS tablet Take 1 tablet (10 mg total) by mouth daily before breakfast.   diazepam  (VALIUM ) 2 MG tablet Take 1 tablet (2 mg  total) by mouth every 6 (six) hours as needed for anxiety.   glucose blood test strip Please dispense as OneTouch Ultra 2. Use as directed to monitor FSBS 1x weekly. Dx: E11.9.   Lancets (ONETOUCH ULTRASOFT) lancets Use as directed to monitor FSBS 1x weekly. Dx: E11.9.   magnesium gluconate (MAGONATE) 500 MG tablet Take 400 mg by mouth 2 (two) times daily.   metFORMIN  (GLUCOPHAGE ) 1000 MG tablet Take 1 tablet (1,000 mg total) by mouth daily with breakfast.   metoprolol  succinate (TOPROL -XL) 25 MG 24 hr tablet Take 1 tablet (25 mg total) by mouth daily.   metroNIDAZOLE  (METROGEL ) 0.75 % gel Apply 1 application topically as needed.   Multiple Vitamin (MULTIVITAMIN WITH MINERALS) TABS tablet Take 1 tablet by mouth daily. (Patient not taking: Reported on 09/21/2023)   Omega-3 Fatty Acids (FISH OIL ) 1000 MG CAPS Take 1 capsule  (1,000 mg total) by mouth 2 (two) times daily.   omeprazole  (PRILOSEC) 20 MG capsule Take 1 capsule (20 mg total) by mouth daily as needed.   ramipril  (ALTACE ) 10 MG capsule Take 1 capsule (10 mg total) by mouth daily.   rosuvastatin  (CRESTOR ) 20 MG tablet Take 1 tablet (20 mg total) by mouth daily.   vitamin B-12 (CYANOCOBALAMIN) 100 MCG tablet Take 1 tablet (100 mcg total) by mouth daily.   No facility-administered encounter medications on file as of 01/04/2024.    Past Surgical History:  Procedure Laterality Date   BREAST CYST ASPIRATION Left 2005   BREAST SURGERY     benign breast nodules 2005   CHOLECYSTECTOMY     COLONOSCOPY  2008   Dr Medoff-Normal   DILATION AND CURETTAGE, DIAGNOSTIC / THERAPEUTIC     ESOPHAGOGASTRODUODENOSCOPY     FRACTURE SURGERY     MALONEY DILATION  02/08/2012   Procedure: MALONEY DILATION;  Surgeon: Margo LITTIE Haddock, MD;  Location: [facility];  Service: Endoscopy;  Laterality: N/A;   SAVORY DILATION  02/08/2012   Procedure: SAVORY DILATION;  Surgeon: Margo LITTIE Haddock, MD;  Location: [facility];  Service: Endoscopy;  Laterality: N/A;   SPINE SURGERY      Family History  Problem Relation Age of Onset   Hyperlipidemia Mother    Osteoarthritis Mother    COPD Father    Colon cancer Maternal Aunt    Colon cancer Maternal Grandfather    Scoliosis Son    Heart disease Neg Hx    Breast cancer Neg Hx       Controlled substance contract: n/a     Review of Systems  Constitutional:  Negative for diaphoresis.  Eyes:  Negative for pain.  Respiratory:  Negative for shortness of breath.   Cardiovascular:  Negative for chest pain, palpitations and leg swelling.  Gastrointestinal:  Negative for abdominal pain.  Endocrine: Negative for polydipsia.  Skin:  Negative for rash.  Neurological:  Negative for dizziness, weakness and headaches.  Hematological:  Does not bruise/bleed easily.  All other systems reviewed and are negative.      Objective:    Physical Exam Vitals and nursing note reviewed.  Constitutional:      General: She is not in acute distress.    Appearance: Normal appearance. She is well-developed.  HENT:     Head: Normocephalic.     Right Ear: Tympanic membrane normal.     Left Ear: Tympanic membrane normal.     Nose: Nose normal.     Mouth/Throat:     Mouth: Mucous membranes are moist.  Eyes:     Pupils: Pupils are equal, round, and reactive to light.   Neck:     Vascular: No carotid bruit or JVD.   Cardiovascular:     Rate and Rhythm: Normal rate and regular rhythm.     Heart sounds: Normal heart sounds.  Pulmonary:     Effort: Pulmonary effort is normal. No respiratory distress.     Breath sounds: Normal breath sounds. No wheezing or rales.  Chest:     Chest wall: No tenderness.  Abdominal:     General: Bowel sounds are normal. There is no distension or abdominal bruit.     Palpations: Abdomen is soft. There is no hepatomegaly, splenomegaly, mass or pulsatile mass.     Tenderness: There is no abdominal tenderness.   Musculoskeletal:        General: Normal range of motion.     Cervical back: Normal range of motion and neck supple.  Lymphadenopathy:     Cervical: No cervical adenopathy.   Skin:    General: Skin is warm and dry.   Neurological:     Mental Status: She is alert and oriented to person, place, and time.     Deep Tendon Reflexes: Reflexes are normal and symmetric.   Psychiatric:        Behavior: Behavior normal.        Thought Content: Thought content normal.        Judgment: Judgment normal.    BP 136/66   Pulse 72   Temp (!) 97.3 F (36.3 C) (Temporal)   Ht 5' 1 (1.549 m)   Wt 156 lb (70.8 kg)   SpO2 95%   BMI 29.48 kg/m     Cryotheray of lesion- patient tolerated well  HGBA1c 7.6%     Assessment & Plan:  Denise Meadows comes in today with chief complaint of medical management of chronic issues    Diagnosis and orders addressed:  1. Primary hypertension Low  sodium diet - CBC with Differential/Platelet - CMP14+EGFR - metoprolol  succinate (TOPROL -XL) 25 MG 24 hr tablet; Take 1 tablet (25 mg total) by mouth daily.  Dispense: 100 tablet; Refill: 1 - ramipril  (ALTACE ) 10 MG capsule; Take 1 capsule (10 mg total) by mouth daily.  Dispense: 100 capsule; Refill: 1  2. Aortic atherosclerosis (HCC)  3. Gastroesophageal reflux disease, unspecified whether esophagitis present Avoid spicy foods Do not eat 2 hours prior to bedtime  - omeprazole  (PRILOSEC) 40 MG capsule; Take 1 capsule (40 mg total) by mouth daily as needed.  Dispense: 100 capsule; Refill: 1  4. Hyperlipidemia associated with type 2 diabetes mellitus (HCC) Low fat diet - Lipid panel - simvastatin  (ZOCOR ) 20 MG tablet; Take 1 tablet (20 mg total) by mouth at bedtime.  Dispense: 100 tablet; Refill: 1  5. Diabetes mellitus treated with oral medication (HCC) Continue to watch carbs in diet - Bayer DCA Hb A1c Waived - metoprolol  succinate (TOPROL -XL) 25 MG 24 hr tablet; Take 1 tablet (25 mg total) by mouth daily.  Dispense: 100 tablet; Refill: 1 - metFORMIN  (GLUCOPHAGE ) 1000 MG tablet; Take 1 tablet (1,000 mg total) by mouth daily with breakfast.  Dispense: 100 tablet; Refill: 1  6. Stage 3a chronic kidney disease (HCC) Labs pending - dapagliflozin  propanediol (FARXIGA ) 10 MG TABS tablet; Take 1 tablet (10 mg total) by mouth daily before breakfast.  Dispense: 90 tablet; Refill: 1  7. Panic attacks - diazepam  (VALIUM ) 2 MG tablet; Take 1 tablet (2 mg total) by mouth  every 6 (six) hours as needed for anxiety.  Dispense: 20 tablet; Refill: 1  8. Insomnia, unspecified type Bedtime routine  9. Rosacea  10. Vitamin D  deficiency Continue vitamin d  supplement  11. BMI 30.0-30.9,adult Discussed diet and exercise for person with BMI >25 Will recheck weight in 3-6 months  12. Keratosis of thigh Cryotherapy Keep clean and dry Do not pick at area.  Labs pending Health Maintenance  reviewed Diet and exercise encouraged  Follow up plan: 6 months   Mary-Margaret Gladis, FNP

## 2024-01-04 NOTE — Patient Instructions (Signed)
 Cryoablation Cryoablation is a procedure to get rid of abnormal growths or cancerous tissue. This is done by freezing the growth or tissue with liquid nitrogen or argon gas. This procedure is also known as cryotherapy or cryosurgery. It may be done to treat: Skin tumors. These are abnormal growths of cells on the skin. Knots of tissue called nodules that are benign. This means that they are not cancerous. Retinoblastoma. This is a type of eye cancer. Cancers of the prostate, liver, kidney, cervix, lung, and bone. Tell a health care provider about: Any allergies you have. All medicines you are taking, including vitamins, herbs, eye drops, creams, and over-the-counter medicines. Any problems you or family members have had with anesthesia. Any bleeding problems you have. Any surgeries you have had. Any medical conditions you have. Whether you are pregnant or may be pregnant. What are the risks? Your health care provider will talk with you about risks. These may include: Infection. Bleeding. Swelling. Allergic reactions to medicines. Damage to nearby structures or organs. In rare cases, there may be damage to nerves. This can cause numbness. What happens before the procedure? When to stop eating and drinking Follow instructions from your health care provider about what you may eat and drink. These may include: 8 hours before your procedure Stop eating most foods. Do not eat meat, fried foods, or fatty foods. Eat only light foods, such as toast or crackers. All liquids are okay except energy drinks and alcohol. 6 hours before your procedure Stop eating. Drink only clear liquids, such as water, clear fruit juice, black coffee, plain tea, and sports drinks. Do not drink energy drinks or alcohol. 2 hours before your procedure Stop drinking all liquids. You may be allowed to take medicines with small sips of water. If you do not follow your health care provider's instructions, your  procedure may be delayed or canceled. Medicines Ask your health care provider about: Changing or stopping your regular medicines. These include any diabetes medicines or blood thinners you take. Taking medicines such as aspirin and ibuprofen. These medicines can thin your blood. Do not take them unless your health care provider tells you to. Taking over-the-counter medicines, vitamins, herbs, and supplements. Tests A medical history will be taken. You may also have an exam and testing. Tests may include: Blood tests. Imaging tests. Surgery safety Ask your health care provider: How your surgery site will be marked. What steps will be taken to help prevent infection. These may include: Removing hair at the surgery site. Washing skin with a soap that kills germs. Receiving antibiotics. General instructions Do not use any products that contain nicotine or tobacco for at least 4 weeks before the procedure. These products include cigarettes, chewing tobacco, and vaping devices, such as e-cigarettes. If you need help quitting, ask your health care provider. If you will be going home right after the procedure, plan to have a responsible adult: Take you home from the hospital or clinic. You will not be allowed to drive. Care for you for the time you are told. What happens during the procedure? An IV will be inserted into one of your veins. You may be given: A sedative. This helps you relax. Anesthesia. This will: Numb certain areas of your body. Make you fall asleep for surgery. A device called a cryoprobe will be used to freeze the growth. Liquid nitrogen or argon gas will flow through the device. How the device is put on the growth will depend on where the growth is  in your body. The cryoprobe may be: Applied directly to the area. This is often done for skin cancers and nodules. Inserted through an incision into the area, such as the prostate. Passed through a thin, long tube called an  endoscope. The scope will allow the device to reach deeper spots in your body, such as the lungs or liver. The cryoprobe may be guided using imaging. This may include an ultrasound, CT scan, or MRI. Liquid nitrogen or argon gas will be sent to the growth until it is frozen and destroyed. If other areas need treatment, the process may be repeated on those areas. The cryoprobe will be removed. Pressure will be applied to stop any bleeding. If an incision was made, it will be closed with stitches (sutures) and covered with a bandage (dressing). The procedure may vary among health care providers and hospitals. What happens after the procedure?  Your blood pressure, heart rate, breathing rate, and blood oxygen level will be monitored until you leave the hospital or clinic. You will be given medicine to help with pain, nausea, and vomiting as needed. If you were given a sedative during the procedure, it can affect you for several hours. Do not drive or operate machinery until your health care provider says that it is safe. This information is not intended to replace advice given to you by your health care provider. Make sure you discuss any questions you have with your health care provider. Document Revised: 12/05/2021 Document Reviewed: 12/05/2021 Elsevier Patient Education  2024 [employer].

## 2024-01-05 LAB — CBC WITH DIFFERENTIAL/PLATELET
Basophils Absolute: 0.1 x10E3/uL (ref 0.0–0.2)
Basos: 1 %
EOS (ABSOLUTE): 0.3 10*3/uL (ref 0.0–0.4)
Eos: 3 %
Hematocrit: 44.4 % (ref 34.0–46.6)
Hemoglobin: 14.2 g/dL (ref 11.1–15.9)
Immature Grans (Abs): 0 x10E3/uL (ref 0.0–0.1)
Immature Granulocytes: 0 %
Lymphocytes Absolute: 2.3 10*3/uL (ref 0.7–3.1)
Lymphs: 25 %
MCH: 31.3 pg (ref 26.6–33.0)
MCHC: 32 g/dL (ref 31.5–35.7)
MCV: 98 fL — ABNORMAL HIGH (ref 79–97)
Monocytes Absolute: 0.9 10*3/uL (ref 0.1–0.9)
Monocytes: 10 %
Neutrophils Absolute: 5.7 x10E3/uL (ref 1.4–7.0)
Neutrophils: 61 %
Platelets: 290 x10E3/uL (ref 150–450)
RBC: 4.54 x10E6/uL (ref 3.77–5.28)
RDW: 12 % (ref 11.7–15.4)
WBC: 9.2 10*3/uL (ref 3.4–10.8)

## 2024-01-05 LAB — CMP14+EGFR
ALT: 13 IU/L (ref 0–32)
AST: 19 IU/L (ref 0–40)
Albumin: 4.6 g/dL (ref 3.8–4.8)
Alkaline Phosphatase: 96 IU/L (ref 44–121)
BUN/Creatinine Ratio: 15 (ref 12–28)
BUN: 19 mg/dL (ref 8–27)
Bilirubin Total: 0.5 mg/dL (ref 0.0–1.2)
CO2: 18 mmol/L — ABNORMAL LOW (ref 20–29)
Calcium: 10.3 mg/dL (ref 8.7–10.3)
Chloride: 99 mmol/L (ref 96–106)
Creatinine, Ser: 1.3 mg/dL — ABNORMAL HIGH (ref 0.57–1.00)
Globulin, Total: 2.7 g/dL (ref 1.5–4.5)
Glucose: 147 mg/dL — AB (ref 70–99)
Potassium: 4.5 mmol/L (ref 3.5–5.2)
Sodium: 136 mmol/L (ref 134–144)
Total Protein: 7.3 g/dL (ref 6.0–8.5)
eGFR: 44 mL/min/{1.73_m2} — AB (ref >59)

## 2024-01-05 LAB — MICROALBUMIN / CREATININE URINE RATIO
Creatinine, Urine: 63 mg/dL
Microalb/Creat Ratio: 9 mg/g{creat} (ref 0–29)
Microalbumin, Urine: 5.4 ug/mL

## 2024-01-05 LAB — LIPID PANEL
Chol/HDL Ratio: 2.8 ratio (ref 0.0–4.4)
Cholesterol, Total: 142 mg/dL (ref 100–199)
HDL: 51 mg/dL (ref >39)
LDL Chol Calc (NIH): 57 mg/dL (ref 0–99)
Triglycerides: 213 mg/dL — AB (ref 0–149)
VLDL Cholesterol Cal: 34 mg/dL (ref 5–40)

## 2024-01-05 LAB — VITAMIN B12: Vitamin B-12: 1481 pg/mL — ABNORMAL HIGH (ref 232–1245)

## 2024-01-06 ENCOUNTER — Ambulatory Visit: Payer: Self-pay | Admitting: Nurse Practitioner

## 2024-01-13 ENCOUNTER — Ambulatory Visit (INDEPENDENT_AMBULATORY_CARE_PROVIDER_SITE_OTHER)

## 2024-01-13 DIAGNOSIS — Z7984 Long term (current) use of oral hypoglycemic drugs: Secondary | ICD-10-CM

## 2024-01-13 DIAGNOSIS — E119 Type 2 diabetes mellitus without complications: Secondary | ICD-10-CM

## 2024-01-13 LAB — HM DIABETES EYE EXAM

## 2024-01-13 NOTE — Progress Notes (Signed)
 Denise Meadows arrived 01/13/2024 and has given verbal consent to obtain images and complete their overdue diabetic retinal screening.  The images have been sent to an ophthalmologist or optometrist for review and interpretation.  Results will be sent back to Gladis Mustard, FNP for review.  Patient has been informed they will be contacted when we receive the results via telephone or MyChart

## 2024-02-04 ENCOUNTER — Telehealth: Payer: Self-pay

## 2024-02-04 DIAGNOSIS — N1831 Chronic kidney disease, stage 3a: Secondary | ICD-10-CM

## 2024-02-04 NOTE — Telephone Encounter (Signed)
 Received a refill request fax from [employer] patient assistance company for patients FARXIGA  medication.   Please send a 90 day supply (with refills) to [facility] if applicable. Thanks!

## 2024-02-08 MED ORDER — DAPAGLIFLOZIN PROPANEDIOL 10 MG PO TABS: 10.0000 mg | ORAL_TABLET | Freq: Every day | ORAL | 5 refills | Status: DC

## 2024-02-08 NOTE — Telephone Encounter (Signed)
 REFILLS SENT 02/08/24

## 2024-02-21 ENCOUNTER — Ambulatory Visit (INDEPENDENT_AMBULATORY_CARE_PROVIDER_SITE_OTHER): Payer: Medicare Other

## 2024-02-21 ENCOUNTER — Other Ambulatory Visit: Payer: Self-pay | Admitting: Nurse Practitioner

## 2024-02-21 VITALS — BP 136/68 | HR 72 | Ht 61.0 in | Wt 156.0 lb

## 2024-02-21 DIAGNOSIS — Z0000 Encounter for general adult medical examination without abnormal findings: Secondary | ICD-10-CM | POA: Diagnosis not present

## 2024-02-21 DIAGNOSIS — Z1231 Encounter for screening mammogram for malignant neoplasm of breast: Secondary | ICD-10-CM

## 2024-02-21 NOTE — Patient Instructions (Addendum)
 Ms. Besser , Thank you for taking time out of your busy schedule to complete your Annual Wellness Visit with me. I enjoyed our conversation and look forward to speaking with you again next year. I, as well as your care team,  appreciate your ongoing commitment to your health goals. Please review the following plan we discussed and let me know if I can assist you in the future. Your Game plan/ To Do List    Referrals: If you haven't heard from the office you've been referred to, please reach out to them at the phone provided.   Follow up Visits: We will see or speak with you next year for your Next Medicare AWV with our clinical staff on 02/21/25 at 8:00a.m. Have you seen your provider in the last 6 months (3 months if uncontrolled diabetes)? Yes  Clinician Recommendations:  Aim for 30 minutes of exercise or brisk walking, 6-8 glasses of water , and 5 servings of fruits and vegetables each day.       This is a list of the screenings recommended for you:  Health Maintenance  Topic Date Due   COVID-19 Vaccine (4 - 2024-25 season) 03/07/2023   Medicare Annual Wellness Visit  02/18/2024   Flu Shot  02/04/2024   Zoster (Shingles) Vaccine (1 of 2) 04/05/2024*   Complete foot exam   03/24/2024   Hemoglobin A1C  07/06/2024   DTaP/Tdap/Td vaccine (2 - Td or Tdap) 09/19/2024   Yearly kidney function blood test for diabetes  01/03/2025   Yearly kidney health urinalysis for diabetes  01/03/2025   Eye exam for diabetics  01/12/2025   Mammogram  03/02/2025   DEXA scan (bone density measurement)  03/25/2025   Colon Cancer Screening  09/02/2032   Pneumococcal Vaccine for age over 61  Completed   Hepatitis C Screening  Completed   HPV Vaccine  Aged Out   Meningitis B Vaccine  Aged Out   Pneumococcal Vaccine  Discontinued  *Topic was postponed. The date shown is not the original due date.    Advanced directives: (Declined) Advance directive discussed with you today. Even though you declined this  today, please call our office should you change your mind, and we can give you the proper paperwork for you to fill out. Advance Care Planning is important because it:  [x]  Makes sure you receive the medical care that is consistent with your values, goals, and preferences  [x]  It provides guidance to your family and loved ones and reduces their decisional burden about whether or not they are making the right decisions based on your wishes.  Follow the link provided in your after visit summary or read over the paperwork we have mailed to you to help you started getting your Advance Directives in place. If you need assistance in completing these, please reach out to us  so that we can help you!  See attachments for Preventive Care and Fall Prevention Tips.

## 2024-02-21 NOTE — Progress Notes (Addendum)
 Subjective:   Denise Meadows is a 73 y.o. who presents for a Medicare Wellness preventive visit.  As a reminder, Annual Wellness Visits don't include a physical exam, and some assessments may be limited, especially if this visit is performed virtually. We may recommend an in-person follow-up visit with your provider if needed.  Visit Complete: Virtual I connected with  [name] on 02/21/24 by a audio enabled telemedicine application and verified that I am speaking with the correct person using two identifiers.  Patient Location: Home  Provider Location: Home Office  I discussed the limitations of evaluation and management by telemedicine. The patient expressed understanding and agreed to proceed.  Vital Signs: Because this visit was a virtual/telehealth visit, some criteria may be missing or patient reported. Any vitals not documented were not able to be obtained and vitals that have been documented are patient reported.  VideoDeclined- This patient declined Librarian, academic. Therefore the visit was completed with audio only.  Persons Participating in Visit: Patient.  AWV Questionnaire: No: Patient Medicare AWV questionnaire was not completed prior to this visit.  Cardiac Risk Factors include: advanced age (>76men, >71 women);dyslipidemia;diabetes mellitus;hypertension;smoking/ tobacco exposure     Objective:    Today's Vitals   02/21/24 0806  BP: 136/68  Pulse: 72  Weight: 156 lb (70.8 kg)  Height: 5' 1 (1.549 m)   Body mass index is 29.48 kg/m.     02/21/2024    8:11 AM 02/18/2023    8:37 AM 05/18/2022    9:53 AM 02/16/2022    9:09 AM 01/22/2021   10:23 AM 02/08/2012    7:40 AM  Advanced Directives  Does Patient Have a Medical Advance Directive? No Yes Yes Yes No Patient does not have advance directive   Type of Customer service manager Power of [place];Living will Living will Healthcare Power of [place];Living will    Does  patient want to make changes to medical advance directive?   Yes (MAU/Ambulatory/Procedural Areas - Information given)     Copy of Healthcare Power of Attorney in Chart?  No - copy requested  No - copy requested    Would patient like information on creating a medical advance directive?     No - Patient declined   Pre-existing out of facility DNR order (yellow form or pink MOST form)      No      Data saved with a previous flowsheet row definition    Current Medications (verified) Outpatient Encounter Medications as of 02/21/2024  Medication Sig   Cholecalciferol (VITAMIN D ) 2000 units tablet Take 1 tablet (2,000 Units total) by mouth daily.   dapagliflozin  propanediol (FARXIGA ) 10 MG TABS tablet Take 1 tablet (10 mg total) by mouth daily before breakfast.   diazepam  (VALIUM ) 2 MG tablet Take 1 tablet (2 mg total) by mouth every 6 (six) hours as needed for anxiety.   glucose blood test strip Please dispense as OneTouch Ultra 2. Use as directed to monitor FSBS 1x weekly. Dx: E11.9.   Lancets (ONETOUCH ULTRASOFT) lancets Use as directed to monitor FSBS 1x weekly. Dx: E11.9.   magnesium gluconate (MAGONATE) 500 MG tablet Take 400 mg by mouth 2 (two) times daily.   metFORMIN  (GLUCOPHAGE ) 1000 MG tablet Take 1 tablet (1,000 mg total) by mouth daily with breakfast.   metoprolol  succinate (TOPROL -XL) 25 MG 24 hr tablet Take 1 tablet (25 mg total) by mouth daily.   metroNIDAZOLE  (METROGEL ) 0.75 % gel Apply 1 application  topically as needed.   Omega-3 Fatty Acids (FISH OIL ) 1000 MG CAPS Take 1 capsule (1,000 mg total) by mouth 2 (two) times daily.   omeprazole  (PRILOSEC) 40 MG capsule Take 1 capsule (40 mg total) by mouth daily.   ramipril  (ALTACE ) 10 MG capsule Take 1 capsule (10 mg total) by mouth daily.   rosuvastatin  (CRESTOR ) 20 MG tablet Take 1 tablet (20 mg total) by mouth daily.   vitamin B-12 (CYANOCOBALAMIN) 100 MCG tablet Take 1 tablet (100 mcg total) by mouth daily.   Multiple Vitamin  (MULTIVITAMIN WITH MINERALS) TABS tablet Take 1 tablet by mouth daily. (Patient not taking: Reported on 02/21/2024)   No facility-administered encounter medications on file as of 02/21/2024.    Allergies (verified) Acetaminophen, Codeine, and Penicillins   History: Past Medical History:  Diagnosis Date   Aortic atherosclerosis (HCC)    Complication of anesthesia    DDD (degenerative disc disease)    Diabetes mellitus    Diastolic dysfunction    Elevated liver function tests    eval by dr sabrina to fatty liver 2008   GERD (gastroesophageal reflux disease)    Hiatal hernia    Hyperlipidemia    Hypertension    NASH (nonalcoholic steatohepatitis)    Nephrolithiasis    Osteoporosis 01/29/2021   PONV (postoperative nausea and vomiting)    heel surgery   Pulmonary nodule    no change-1995   Rosacea    Ulcer    gastric ulcer, antritis-egd 09/06/08   Vitamin D  deficiency    Past Surgical History:  Procedure Laterality Date   BREAST CYST ASPIRATION Left 2005   BREAST SURGERY     benign breast nodules 2005   CHOLECYSTECTOMY     COLONOSCOPY  2008   Dr Medoff-Normal   DILATION AND CURETTAGE, DIAGNOSTIC / THERAPEUTIC     ESOPHAGOGASTRODUODENOSCOPY     FRACTURE SURGERY     MALONEY DILATION  02/08/2012   Procedure: MALONEY DILATION;  Surgeon: Margo LITTIE Haddock, MD;  [facility];  Service: Endoscopy;  Laterality: N/A;   SAVORY DILATION  02/08/2012   Procedure: SAVORY DILATION;  Surgeon: Margo LITTIE Haddock, MD;  Location: [facility];  Service: Endoscopy;  Laterality: N/A;   SPINE SURGERY     Family History  Problem Relation Age of Onset   Hyperlipidemia Mother    Osteoarthritis Mother    COPD Father    Colon cancer Maternal Aunt    Colon cancer Maternal Grandfather    Scoliosis Son    Heart disease Neg Hx    Breast cancer Neg Hx    Social History   Socioeconomic History   Marital status: Married    Spouse name: Not on file   Number of children: 2    Years of education: Not on file   Highest education level: Not on file  Occupational History   Occupation: homemaker  Tobacco Use   Smoking status: Former    Current packs/day: 0.00    Types: Cigarettes    Quit date: 07/06/1988    Years since quitting: 35.6   Smokeless tobacco: Never   Tobacco comments:    intermittent for a few months  Vaping Use   Vaping status: Never Used  Substance and Sexual Activity   Alcohol use: Not Currently    Comment: occasional mixed drink couple per mo   Drug use: No   Sexual activity: Not on file  Other Topics Concern   Not on file  Social History Narrative  Lives w/ husband   Social Drivers of Corporate investment banker Strain: Low Risk  (02/21/2024)   Overall Financial Resource Strain (CARDIA)    Difficulty of Paying Living Expenses: Not hard at all  Food Insecurity: No Food Insecurity (02/21/2024)   Hunger Vital Sign    Worried About Running Out of Food in the Last Year: Never true    Ran Out of Food in the Last Year: Never true  Transportation Needs: No Transportation Needs (12/06/2023)   Received [facility]    Lack of Transportation (Medical): No    Lack of Transportation (Non-Medical): No  Physical Activity: Sufficiently Active (02/21/2024)   Exercise Vital Sign    Days of Exercise per Week: 5 days    Minutes of Exercise per Session: 30 min  Stress: No Stress Concern Present (02/21/2024)   [employer] of Occupational Health - Occupational Stress Questionnaire    Feeling of Stress: Not at all  Social Connections: Moderately Integrated (02/21/2024)   Social Connection and Isolation Panel    Frequency of Communication with Friends and Family: More than three times a week    Frequency of Social Gatherings with Friends and Family: More than three times a week    Attends Religious Services: More than 4 times per year    Active Member of [employer] or Organizations: No    Attends Hospital doctor: Never    Marital Status: Married    Tobacco Counseling Counseling given: Yes Tobacco comments: intermittent for a few months    Clinical Intake:  Pre-visit preparation completed: Yes  Pain : No/denies pain     BMI - recorded: 29.48 Nutritional Status: BMI 25 -29 Overweight Nutritional Risks: None Diabetes: Yes  Lab Results  Component Value Date   HGBA1C 7.6 (H) 01/04/2024   HGBA1C 8.0 (H) 09/21/2023   HGBA1C 7.3 (H) 03/25/2023     How often do you need to have someone help you when you read instructions, pamphlets, or other written materials from your doctor or pharmacy?: 1 - Never  Interpreter Needed?: No  Information entered by :: alia t/cma   Activities of Daily Living     02/21/2024    8:09 AM  In your present state of health, do you have any difficulty performing the following activities:  Hearing? 1  Vision? 0  Difficulty concentrating or making decisions? 0  Walking or climbing stairs? 1  Dressing or bathing? 0  Doing errands, shopping? 0  Preparing Food and eating ? N  Using the Toilet? N  In the past six months, have you accidently leaked urine? Y  Do you have problems with loss of bowel control? N  Managing your Medications? N  Managing your Finances? N  Housekeeping or managing your Housekeeping? N    Patient Care Team: Gladis Mustard, FNP as PCP - General (Family Medicine) Harvey Margo CROME, MD (Inactive) (Gastroenterology) Nicholaus Sherlean CROME, [facility] (Inactive) as Pharmacist (Pharmacist) Ladora Ross Lacy Phebe, MD as Referring Physician (Optometry) Billee Mliss BIRCH, [facility] as Pharmacist (Family Medicine) Swinyer, Rosaline HERO, NP as Nurse Practitioner (Cardiology)  I have updated your Care Teams any recent Medical Services you may have received from other providers in the past year.     Assessment:   This is a routine wellness examination for Tinia.  Hearing/Vision screen Hearing Screening - Comments:: Pt have stated that sometimes she  has dif hearing  Vision Screening - Comments:: Pt wear glasses/pt goes to  [facility] in [address], [place]/last ov 11/2023   Goals Addressed   None    Depression Screen     02/21/2024    8:15 AM 01/04/2024    9:51 AM 03/25/2023    8:26 AM 02/18/2023    8:36 AM 09/22/2022    8:11 AM 05/18/2022    9:48 AM 05/18/2022    9:47 AM  PHQ 2/9 Scores  PHQ - 2 Score 0 0 0 0 0 0 0  PHQ- 9 Score   0  0      Fall Risk     02/21/2024    8:08 AM 01/04/2024    9:51 AM 03/25/2023    8:26 AM 02/18/2023    8:33 AM 09/22/2022    8:11 AM  Fall Risk   Falls in the past year? 0 0 0 0 0  Number falls in past yr: 0   0 0  Injury with Fall? 0   0 0  Risk for fall due to : No Fall Risks   No Fall Risks   Follow up Falls evaluation completed   Falls prevention discussed     MEDICARE RISK AT HOME:  Medicare Risk at Home Any stairs in or around the home?: No If so, are there any without handrails?: No Home free of loose throw rugs in walkways, pet beds, electrical cords, etc?: Yes Adequate lighting in your home to reduce risk of falls?: Yes Life alert?: No Use of a cane, walker or w/c?: No Grab bars in the bathroom?: No Shower chair or bench in shower?: No Elevated toilet seat or a handicapped toilet?: Yes  TIMED UP AND GO:  Was the test performed?  no  Cognitive Function: 6CIT completed        02/21/2024    8:12 AM 02/18/2023    8:37 AM 02/16/2022    9:10 AM 01/22/2021   10:29 AM  6CIT Screen  What Year? 0 points 0 points 0 points 0 points  What month? 0 points 0 points 0 points 0 points  What time? 0 points 0 points 0 points 0 points  Count back from 20 0 points 0 points 0 points 0 points  Months in reverse 0 points 0 points 0 points 0 points  Repeat phrase 0 points 0 points 0 points 0 points  Total Score 0 points 0 points 0 points 0 points    Immunizations Immunization History  Administered Date(s) Administered   Fluad Quad(high Dose 65+) 04/03/2021, 03/24/2022   Fluad Trivalent(High Dose 65+)  03/25/2023   Influenza, High Dose Seasonal PF 04/24/2017   Influenza,inj,Quad PF,6+ Mos 04/08/2016   Influenza-Unspecified 04/24/2017, 04/05/2018   PFIZER(Purple Top)SARS-COV-2 Vaccination 07/26/2019, 08/15/2019, 04/12/2020   Pneumococcal Conjugate-13 06/03/2016   Pneumococcal Polysaccharide-23 06/07/2017   Tdap 09/20/2014   Zoster, Live 03/25/2015    Screening Tests Health Maintenance  Topic Date Due   COVID-19 Vaccine (4 - 2024-25 season) 03/07/2023   INFLUENZA VACCINE  02/04/2024   Zoster Vaccines- Shingrix (1 of 2) 04/05/2024 (Originally 02/11/1970)   FOOT EXAM  03/24/2024   HEMOGLOBIN A1C  07/06/2024   DTaP/Tdap/Td (2 - Td or Tdap) 09/19/2024   Diabetic kidney evaluation - eGFR measurement  01/03/2025   Diabetic kidney evaluation - Urine ACR  01/03/2025   OPHTHALMOLOGY EXAM  01/12/2025   Medicare Annual Wellness (AWV)  02/20/2025   MAMMOGRAM  03/02/2025   DEXA SCAN  03/25/2025   Colonoscopy  09/02/2032   Pneumococcal Vaccine: 50+ Years  Completed   Hepatitis C  Screening  Completed   HPV VACCINES  Aged Out   Meningococcal B Vaccine  Aged Out   Pneumococcal Vaccine  Discontinued    Health Maintenance  Health Maintenance Due  Topic Date Due   COVID-19 Vaccine (4 - 2024-25 season) 03/07/2023   INFLUENZA VACCINE  02/04/2024   Health Maintenance Items Addressed: See Nurse Notes at the end of this note  Additional Screening:  Vision Screening: Recommended annual ophthalmology exams for early detection of glaucoma and other disorders of the eye. Would you like a referral to an eye doctor? No    Dental Screening: Recommended annual dental exams for proper oral hygiene  Community Resource Referral / Chronic Care Management: CRR required this visit?  No   CCM required this visit?  No   Plan:    I have personally reviewed and noted the following in the patient's chart:   Medical and social history Use of alcohol, tobacco or illicit drugs  Current medications and  supplements including opioid prescriptions. Patient is not currently taking opioid prescriptions. Functional ability and status Nutritional status Physical activity Advanced directives List of other physicians Hospitalizations, surgeries, and ER visits in previous 12 months Vitals Screenings to include cognitive, depression, and falls Referrals and appointments  In addition, I have reviewed and discussed with patient certain preventive protocols, quality metrics, and best practice recommendations. A written personalized care plan for preventive services as well as general preventive health recommendations were provided to patient.   Ozie Ned, CMA   02/21/2024   After Visit Summary: (MyChart) Due to this being a telephonic visit, the after visit summary with patients personalized plan was offered to patient via MyChart   Notes: Nothing significant to report at this time.

## 2024-02-25 ENCOUNTER — Other Ambulatory Visit: Payer: Self-pay | Admitting: *Deleted

## 2024-02-25 MED ORDER — ACCU-CHEK GUIDE TEST VI STRP: ORAL_STRIP | 3 refills | Status: AC

## 2024-02-25 MED ORDER — ACCU-CHEK GUIDE ME W/DEVICE KIT: PACK | 0 refills | Status: AC

## 2024-02-25 MED ORDER — ACCU-CHEK SOFTCLIX LANCETS MISC: 3 refills | Status: AC

## 2024-02-25 NOTE — Telephone Encounter (Signed)
 Fax from [employer] diabetic supplies are no longer covered Changed and sent in [employer] guide me supplies

## 2024-03-13 ENCOUNTER — Other Ambulatory Visit: Payer: Self-pay | Admitting: Nurse Practitioner

## 2024-03-13 ENCOUNTER — Ambulatory Visit
Admission: RE | Admit: 2024-03-13 | Discharge: 2024-03-13 | Disposition: A | Discharge status: Home / Self Care | Source: Ambulatory Visit | Attending: Nurse Practitioner | Admitting: Nurse Practitioner

## 2024-03-13 DIAGNOSIS — Z1231 Encounter for screening mammogram for malignant neoplasm of breast: Secondary | ICD-10-CM

## 2024-03-30 ENCOUNTER — Ambulatory Visit (INDEPENDENT_AMBULATORY_CARE_PROVIDER_SITE_OTHER): Admitting: *Deleted

## 2024-03-30 DIAGNOSIS — Z23 Encounter for immunization: Secondary | ICD-10-CM

## 2024-03-30 NOTE — Progress Notes (Signed)
 Patient is in office today for a nurse visit for FLU SHOT. Injection was given in RIGHT DELTOID. Patient tolerated well.

## 2024-04-10 ENCOUNTER — Ambulatory Visit: Payer: Self-pay

## 2024-04-10 NOTE — Telephone Encounter (Signed)
Apppt made.

## 2024-04-10 NOTE — Telephone Encounter (Signed)
 FYI Only or Action Required?: FYI only for provider.  Patient was last seen in primary care on 01/04/2024 by Gladis Mustard, FNP.  Called Nurse Triage reporting Insect Bite.  Symptoms began several days ago.  Symptoms are: unchanged.  Triage Disposition: See PCP When Office is Open (Within 3 Days)  Patient/caregiver understands and will follow disposition?: Yes     Copied from CRM #[id number]. Topic: Clinical - Red Word Triage >> Apr 10, 2024 10:46 AM Willma R wrote: [facility] that prompted transfer to Nurse Triage: Patient noticed this weekend had bites on her left leg that are itchy. States they itch so bad she has scratched to where her leg is bruised.        Reason for Disposition  [1] SEVERE local itching (e.g., interferes with work, school, sleep) AND [2] not improved after 24 hours of hydrocortisone cream  Answer Assessment - Initial Assessment Questions Patient would like to be notified if an appointment with her PCP if opens up before tomorrow, but will otherwise come in tomorrow for her scheduled appointment.      1. TYPE of INSECT: What type of insect was it?      Unsure  2. ONSET: When did you get bitten?      2-3 days ago  3. LOCATION: Where is the insect bite located?      Left leg  4. REDNESS: Is the area red or pink? If Yes, ask: What size is the area of redness? (inches or cm). When did the redness start?     Only when scratching  5. PAIN: Is there any pain? If Yes, ask: How bad is the pain? (Scale 0-10; or none, mild, moderate, severe)     No 6. ITCHING: Does it itch? If Yes, ask: How bad is the itch?      Moderate to severe 7. SWELLING: How big is the swelling? (e.g., inches, cm, or compare to coins)     No 8. OTHER SYMPTOMS: Do you have any other symptoms?  (e.g., difficulty breathing, fever, hives)     Some bruising from scratching  Protocols used: Insect Bite-A-AH

## 2024-04-11 ENCOUNTER — Ambulatory Visit: Admitting: Nurse Practitioner

## 2024-04-11 ENCOUNTER — Encounter: Payer: Self-pay | Admitting: Nurse Practitioner

## 2024-04-11 VITALS — BP 124/63 | HR 75 | Temp 97.5°F | Ht 61.0 in | Wt 154.2 lb

## 2024-04-11 DIAGNOSIS — S80862A Insect bite (nonvenomous), left lower leg, initial encounter: Secondary | ICD-10-CM

## 2024-04-11 DIAGNOSIS — W57XXXA Bitten or stung by nonvenomous insect and other nonvenomous arthropods, initial encounter: Secondary | ICD-10-CM

## 2024-04-11 MED ORDER — TRIAMCINOLONE ACETONIDE 0.5 % EX CREA: TOPICAL_CREAM | Freq: Two times a day (BID) | CUTANEOUS | 0 refills | Status: DC

## 2024-04-11 MED ORDER — PREDNISONE 10 MG PO TABS: 10.0000 mg | ORAL_TABLET | Freq: Every day | ORAL | 0 refills | Status: DC

## 2024-04-11 MED ORDER — METHYLPREDNISOLONE SODIUM SUCC 125 MG IJ SOLR: 60.0000 mg | Freq: Once | INTRAMUSCULAR | Status: DC

## 2024-04-11 MED ORDER — METHYLPREDNISOLONE ACETATE 40 MG/ML IJ SUSP
40.0000 mg | Freq: Once | INTRAMUSCULAR | Status: AC
  Administered 2024-04-11: 40 mg via INTRAMUSCULAR

## 2024-04-11 NOTE — Progress Notes (Signed)
 Subjective:  Patient ID: Denise Meadows, female    DOB: Sep 25, 1950, 73 y.o.   MRN: [id number]  Patient Care Team: Gladis Mustard, FNP as PCP - General (Family Medicine) Harvey Margo CROME, MD (Inactive) (Gastroenterology) Nicholaus Sherlean CROME, [facility] (Inactive) as Pharmacist (Pharmacist) Ladora Ross Lacy Phebe, MD as Referring Physician (Optometry) Billee, Mliss BIRCH, [facility] as Pharmacist (Family Medicine) Swinyer, Rosaline HERO, NP as Nurse Practitioner (Cardiology)   Chief Complaint:  Rash (Rash on leg thinks it is insect bites on left leg)   HPI: Denise Meadows is a 73 y.o. female presenting on 04/11/2024 for Rash (Rash on leg thinks it is insect bites on left leg)   Discussed the use of AI scribe software for clinical note transcription with the patient, who gave verbal consent to proceed.  History of Present Illness Denise Meadows is a 73 year old female who presents with a rash on her left leg.  She developed itchy spots on her left leg after spending time outdoors at the [facility] and attending a chicken stew event. Despite wearing long sleeves, long pants, and socks, the spots appeared. She describes the spots as itchy but did not feel any bites. Her husband has not experienced any similar symptoms. She also noticed two similar spots on her back last night.  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 She recently took a course of 4 prednisone  pills, which she had on hand for potential back flare-ups, to address the current rash.  She is in the early stages of kidney failure and is concerned about the impact of medications on her kidneys. She is on a fixed income and is mindful of medication costs, inquiring about tier one medications. She currently takes Zyrtec 5 mg daily for itchiness.  No fever, swelling, vision problems, fatigue, or pain.      Relevant past medical, surgical, family, and social history  reviewed and updated as indicated.  Allergies and medications reviewed and updated. Data reviewed: Chart in Epic.   Past Medical History:  Diagnosis Date   Aortic atherosclerosis    Complication of anesthesia    DDD (degenerative disc disease)    Diabetes mellitus    Diastolic dysfunction    Elevated liver function tests    eval by dr sabrina to fatty liver 2008   GERD (gastroesophageal reflux disease)    Hiatal hernia    Hyperlipidemia    Hypertension    NASH (nonalcoholic steatohepatitis)    Nephrolithiasis    Osteoporosis 01/29/2021   PONV (postoperative nausea and vomiting)    heel surgery   Pulmonary nodule    no change-1995   Rosacea    Ulcer    gastric ulcer, antritis-egd 09/06/08   Vitamin D  deficiency     Past Surgical History:  Procedure Laterality Date   BREAST CYST ASPIRATION Left 2005   BREAST SURGERY     benign breast nodules 2005   CHOLECYSTECTOMY     COLONOSCOPY  2008   Dr Medoff-Normal   DILATION AND CURETTAGE, DIAGNOSTIC / THERAPEUTIC     ESOPHAGOGASTRODUODENOSCOPY     FRACTURE SURGERY     MALONEY DILATION  02/08/2012   Procedure: MALONEY DILATION;  Surgeon: Margo CROME Harvey, MD;  Location[facility];  Service: Endoscopy;  Laterality: N/A;   SAVORY DILATION  02/08/2012   Procedure: SAVORY DILATION;  Surgeon: Margo CROME Harvey, MD;  Location: [facility];  Service: Endoscopy;  Laterality: N/A;   SPINE SURGERY      Social History   Socioeconomic History   Marital status: Married    Spouse name: Not on file   Number of children: 2   Years of education: Not on file   Highest education level: Not on file  Occupational History   Occupation: homemaker  Tobacco Use   Smoking status: Former    Current packs/day: 0.00    Types: Cigarettes    Quit date: 07/06/1988    Years since quitting: 35.7   Smokeless tobacco: Never   Tobacco comments:    intermittent for a few months  Vaping Use   Vaping status: Never Used  Substance and Sexual  Activity   Alcohol use: Not Currently    Comment: occasional mixed drink couple per mo   Drug use: No   Sexual activity: Not on file  Other Topics Concern   Not on file  Social History Narrative   Lives w/ husband   Social Drivers of Health   Financial Resource Strain: Low Risk  (02/21/2024)   Overall Financial Resource Strain (CARDIA)    Difficulty of Paying Living Expenses: Not hard at all  Food Insecurity: No Food Insecurity (02/21/2024)   Hunger Vital Sign    Worried About Running Out of Food in the Last Year: Never true    Ran Out of Food in the Last Year: Never true  Transportation Needs: No Transportation Needs (12/06/2023)   Received [facility]    Lack of Transportation (Medical): No    Lack of Transportation (Non-Medical): No  Physical Activity: Sufficiently Active (02/21/2024)   Exercise Vital Sign    Days of Exercise per Week: 5 days    Minutes of Exercise per Session: 30 min  Stress: No Stress Concern Present (02/21/2024)   [employer] of Occupational Health - Occupational Stress Questionnaire    Feeling of Stress: Not at all  Social Connections: Moderately Integrated (02/21/2024)   Social Connection and Isolation Panel    Frequency of Communication with Friends and Family: More than three times a week    Frequency of Social Gatherings with Friends and Family: More than three times a week    Attends Religious Services: More than 4 times per year    Active Member of [employer] or Organizations: No    Attends Banker Meetings: Never    Marital Status: Married  Catering manager Violence: Not At Risk (02/21/2024)   Humiliation, Afraid, Rape, and Kick questionnaire    Fear of Current or Ex-Partner: No    Emotionally Abused: No    Physically Abused: No    Sexually Abused: No    Outpatient Encounter Medications as of 04/11/2024  Medication Sig   Accu-Chek Softclix Lancets lancets Test BS daily Dx E11.9   Blood Glucose  Monitoring Suppl (ACCU-CHEK GUIDE ME) w/Device KIT Test BS daily Dx E11.9   Cholecalciferol (VITAMIN D ) 2000 units tablet Take 1 tablet (2,000 Units total) by mouth daily.   dapagliflozin  propanediol (FARXIGA ) 10 MG TABS tablet Take 1 tablet (10 mg total) by mouth daily before breakfast.   diazepam  (VALIUM ) 2 MG tablet Take 1 tablet (2 mg total) by mouth every 6 (six) hours as needed for anxiety.   glucose blood (ACCU-CHEK GUIDE TEST) test strip Test BS daily Dx E11.9   magnesium gluconate (MAGONATE) 500 MG tablet Take 400 mg by mouth 2 (two) times daily.   metFORMIN  (  GLUCOPHAGE ) 1000 MG tablet Take 1 tablet (1,000 mg total) by mouth daily with breakfast.   metoprolol  succinate (TOPROL -XL) 25 MG 24 hr tablet Take 1 tablet (25 mg total) by mouth daily.   metroNIDAZOLE  (METROGEL ) 0.75 % gel Apply 1 application topically as needed.   Multiple Vitamin (MULTIVITAMIN WITH MINERALS) TABS tablet Take 1 tablet by mouth daily.   Omega-3 Fatty Acids (FISH OIL ) 1000 MG CAPS Take 1 capsule (1,000 mg total) by mouth 2 (two) times daily.   omeprazole  (PRILOSEC) 40 MG capsule TAKE 1 CAPSULE BY MOUTH DAILY   ramipril  (ALTACE ) 10 MG capsule Take 1 capsule (10 mg total) by mouth daily.   rosuvastatin  (CRESTOR ) 20 MG tablet Take 1 tablet (20 mg total) by mouth daily.   vitamin B-12 (CYANOCOBALAMIN) 100 MCG tablet Take 1 tablet (100 mcg total) by mouth daily.   No facility-administered encounter medications on file as of 04/11/2024.    Allergies  Allergen Reactions   Acetaminophen Nausea And Vomiting   Codeine Nausea And Vomiting   Penicillins Rash    Pertinent ROS per HPI, otherwise unremarkable      Objective:  BP 124/63   Pulse 75   Temp (!) 97.5 F (36.4 C) (Temporal)   Ht 5' 1 (1.549 m)   Wt 154 lb 3.2 oz (69.9 kg)   SpO2 96%   BMI 29.14 kg/m    Wt Readings from Last 3 Encounters:  04/11/24 154 lb 3.2 oz (69.9 kg)  02/21/24 156 lb (70.8 kg)  01/04/24 156 lb (70.8 kg)    Physical  Exam Vitals and nursing note reviewed.  Constitutional:      General: She is not in acute distress. HENT:     Head: Normocephalic and atraumatic.     Nose: Nose normal.  Eyes:     General: No scleral icterus.    Extraocular Movements: Extraocular movements intact.     Conjunctiva/sclera: Conjunctivae normal.     Pupils: Pupils are equal, round, and reactive to light.  Cardiovascular:     Heart sounds: Normal heart sounds.  Pulmonary:     Effort: Pulmonary effort is normal.     Breath sounds: Normal breath sounds.  Musculoskeletal:        General: Normal range of motion.     Right lower leg: No edema.     Left lower leg: No edema.  Skin:    General: Skin is warm and dry.     Findings: Bruising and rash present.  Neurological:     Mental Status: She is alert and oriented to person, place, and time.  Psychiatric:        Mood and Affect: Mood normal.        Behavior: Behavior normal.        Thought Content: Thought content normal.        Judgment: Judgment normal.       Physical Exam      Results for orders placed or performed in visit on 01/26/24  HM DIABETES EYE EXAM   Collection Time: 01/13/24 12:21 PM  Result Value Ref Range   HM Diabetic Eye Exam No Retinopathy No Retinopathy       Pertinent labs & imaging results that were available during my care of the patient were reviewed by me and considered in my medical decision making.  Assessment & Plan:  There are no diagnoses linked to this encounter.   Assessment and Plan Matti is a 73 year old Caucasian female seen today for insect  bite, no acute distress Assessment & Plan Acute pruritic rash and insect bites Acute pruritic rash on left leg and back, likely mosquito bites. No systemic symptoms or complications. - Administered steroid injection in-office. - Prescribed prednisone  10 mg daily for five days. - Prescribed triamcinolone cream, apply twice daily to affected areas. - Prescribed Zyrtec 10 mg for  itchiness, sample provided. - Advised to avoid scratching.  Chronic kidney disease Early-stage chronic kidney disease. Prednisone  and clobetasol do not impact kidney function. - Reassured safety of prednisone  and clobetasol with kidney condition.      Continue all other maintenance medications.  Follow up plan: No follow-ups on file.   Continue healthy lifestyle choices, including diet (rich in fruits, vegetables, and lean proteins, and low in salt and simple carbohydrates) and exercise (at least 30 minutes of moderate physical activity daily).  Educational handout given for    Insect Bite, Adult An insect bite can make your skin red, itchy, and swollen. An insect bite is different from an insect sting, which happens when an insect injects poison (venom) into the skin. Some insects can spread disease to people through a bite. However, most insect bites do not lead to disease and are not serious. What are the causes? Insects may bite for a variety of reasons, including: Hunger. To defend themselves. Insects that bite include: Spiders. Mosquitoes and flies. Ticks and fleas. Ants. Kissing bugs. Chiggers. What are the signs or symptoms? In many cases, symptoms last for 2-4 days. However, itching can last up to 10 days. Symptoms include: Itching or pain in the bite area. Redness and swelling in the bite area. An open wound (skin ulcer). In rare cases, a person may have a severe allergic reaction (anaphylactic reaction) to a bite. Symptoms of an anaphylactic reaction may include: Feeling warm in the face (flushed). This may include redness. Itchy, red, swollen areas of skin (hives). Swelling of the eyes, lips, face, mouth, tongue, or throat. Wheezing or difficulty breathing, speaking, or swallowing. Dizziness, light-headedness, or fainting. Abdominal symptoms like cramping, nausea, vomiting, or diarrhea. How is this diagnosed? This condition is usually diagnosed based on  symptoms and a physical exam. During the exam, your health care provider will look at the bite and ask you what kind of insect bit you. How is this treated? Most insect bites are not serious. Symptoms often go away on their own and treatment is not usually needed. When treatment is recommended, it may include: Applying ice to the affected area. Applying steroid or other anti-itch creams, like calamine lotion, to the bite area. Medicines called antihistamines to reduce itching. You may also need: A tetanus shot if you are not up to date. Antibiotic cream or an oral antibiotic if the bite becomes infected (this is uncommon). Follow these instructions at home: Bite area care  Do not scratch the bite area. It may help to cover the bite area with a bandage or close-fitting clothing. Keep the bite area clean and dry. Wash it every day with soap and water  as told by your health care provider. Check the bite area every day for signs of infection. Check for: More redness, swelling, or pain. Fluid or blood. Warmth. Pus or a bad smell. Managing pain, itching, and swelling  You may apply cortisone cream, calamine lotion, or a paste made of baking soda and water  to the bite area as told by your health care provider. If directed, put ice on the bite area. To do this: Put ice in a  plastic bag. Place a towel between your skin and the bag. Leave the ice on for 20 minutes, 2-3 times a day. If your skin turns bright red, remove the ice right away to prevent skin damage. The risk of skin damage is higher if you cannot feel pain, heat, or cold. General instructions Apply or take over-the-counter and prescription medicine only as told by your health care provider. If you were prescribed antibiotics, take or apply them as told by your health care provider. Do not stop using the antibiotic even if you start to feel better. How is this prevented? To help reduce your risk of insect bites: When you are  outdoors, wear clothing that covers your arms and legs. This is especially important in the early morning and evening. Use insect repellent. The best insect repellents contain DEET, picaridin, oil of lemon eucalyptus (OLE), or IR3535. Consider spraying your clothing with a pesticide called permethrin. Permethrin helps prevent insect bites. It works for several weeks and for up to 5-6 clothing washes. Do not apply permethrin directly to the skin. If your home windows do not have screens, consider installing them. If you will be sleeping in an area where there are mosquitoes, consider covering your sleeping area with a mosquito net. Contact a health care provider if: Your bite area has signs of infection, such as: More redness, swelling, or pain. Fluid or blood. Warmth. Pus or a bad smell. You have a fever. Get help right away if: You have a rash. You have muscle or joint pain. You feel unusually tired or weak. You have neck pain or a headache. You develop symptoms of an anaphylactic reaction. These may include: Swelling of the eyes, lips, face, mouth, tongue, or throat. Flushed skin or hives. Wheezing. Difficulty breathing, speaking, or swallowing. Dizziness, light-headedness, or fainting. Abdominal pain, cramping, vomiting, or diarrhea. These symptoms may be an emergency. Get help right away. Call 911. Do not wait to see if the symptoms will go away. Do not drive yourself to the hospital. Summary An insect bite can make your skin red, itchy, and swollen. Treatment is usually not needed. Symptoms often go away on their own. When treatment is recommended, it may involve taking medicine, applying medicine to the area, or applying ice. Apply or take over-the-counter and prescription medicines only as told by your health care provider. Use insect repellent to help prevent insect bites. Contact a health care provider if your bite area has signs of infection. This information is not  intended to replace advice given to you by your health care provider. Make sure you discuss any questions you have with your health care provider. Document Revised: 10/01/2021 Document Reviewed: 09/16/2021 Elsevier Patient Education  2024 Elsevier Inc.    The above assessment and management plan was discussed with the patient. The patient verbalized understanding of and has agreed to the management plan. Patient is aware to call the clinic if they develop any new symptoms or if symptoms persist or worsen. Patient is aware when to return to the clinic for a follow-up visit. Patient educated on when it is appropriate to go to the emergency department.   Constantin Hillery [place] Thompson, DNP [facility] [address] [place], [place] [address] [phone]

## 2024-04-17 ENCOUNTER — Telehealth: Payer: Self-pay

## 2024-04-17 NOTE — Telephone Encounter (Signed)
 Has appt. 06/27/24

## 2024-06-14 ENCOUNTER — Encounter (HOSPITAL_BASED_OUTPATIENT_CLINIC_OR_DEPARTMENT_OTHER): Payer: Self-pay | Admitting: Nurse Practitioner

## 2024-06-23 ENCOUNTER — Other Ambulatory Visit: Payer: Self-pay | Admitting: Nurse Practitioner

## 2024-06-23 DIAGNOSIS — I10 Essential (primary) hypertension: Secondary | ICD-10-CM

## 2024-06-23 DIAGNOSIS — E119 Type 2 diabetes mellitus without complications: Secondary | ICD-10-CM

## 2024-06-27 ENCOUNTER — Ambulatory Visit: Payer: Self-pay | Admitting: Nurse Practitioner

## 2024-06-27 ENCOUNTER — Encounter: Payer: Self-pay | Admitting: Nurse Practitioner

## 2024-06-27 ENCOUNTER — Telehealth: Payer: Self-pay | Admitting: Family Medicine

## 2024-06-27 VITALS — BP 112/68 | HR 63 | Temp 97.1°F | Ht 61.0 in | Wt 154.0 lb

## 2024-06-27 DIAGNOSIS — L719 Rosacea, unspecified: Secondary | ICD-10-CM | POA: Diagnosis not present

## 2024-06-27 DIAGNOSIS — I700 Atherosclerosis of aorta: Secondary | ICD-10-CM | POA: Diagnosis not present

## 2024-06-27 DIAGNOSIS — K219 Gastro-esophageal reflux disease without esophagitis: Secondary | ICD-10-CM

## 2024-06-27 DIAGNOSIS — F410 Panic disorder [episodic paroxysmal anxiety] without agoraphobia: Secondary | ICD-10-CM | POA: Diagnosis not present

## 2024-06-27 DIAGNOSIS — Z7984 Long term (current) use of oral hypoglycemic drugs: Secondary | ICD-10-CM

## 2024-06-27 DIAGNOSIS — G4700 Insomnia, unspecified: Secondary | ICD-10-CM | POA: Diagnosis not present

## 2024-06-27 DIAGNOSIS — E1169 Type 2 diabetes mellitus with other specified complication: Secondary | ICD-10-CM

## 2024-06-27 DIAGNOSIS — E559 Vitamin D deficiency, unspecified: Secondary | ICD-10-CM

## 2024-06-27 DIAGNOSIS — M810 Age-related osteoporosis without current pathological fracture: Secondary | ICD-10-CM | POA: Diagnosis not present

## 2024-06-27 DIAGNOSIS — I10 Essential (primary) hypertension: Secondary | ICD-10-CM | POA: Diagnosis not present

## 2024-06-27 DIAGNOSIS — E119 Type 2 diabetes mellitus without complications: Secondary | ICD-10-CM

## 2024-06-27 DIAGNOSIS — N1831 Chronic kidney disease, stage 3a: Secondary | ICD-10-CM | POA: Diagnosis not present

## 2024-06-27 DIAGNOSIS — Z6830 Body mass index (BMI) 30.0-30.9, adult: Secondary | ICD-10-CM | POA: Diagnosis not present

## 2024-06-27 LAB — LIPID PANEL
Chol/HDL Ratio: 3 ratio (ref 0.0–4.4)
Cholesterol, Total: 145 mg/dL (ref 100–199)
HDL: 49 mg/dL
LDL Chol Calc (NIH): 66 mg/dL (ref 0–99)
Triglycerides: 177 mg/dL — ABNORMAL HIGH (ref 0–149)
VLDL Cholesterol Cal: 30 mg/dL (ref 5–40)

## 2024-06-27 LAB — CBC WITH DIFFERENTIAL/PLATELET
Basophils Absolute: 0.1 x10E3/uL (ref 0.0–0.2)
Basos: 1 %
EOS (ABSOLUTE): 0.3 x10E3/uL (ref 0.0–0.4)
Eos: 4 %
Hematocrit: 39.9 % (ref 34.0–46.6)
Hemoglobin: 13.1 g/dL (ref 11.1–15.9)
Immature Grans (Abs): 0 x10E3/uL (ref 0.0–0.1)
Immature Granulocytes: 0 %
Lymphocytes Absolute: 2.1 x10E3/uL (ref 0.7–3.1)
Lymphs: 30 %
MCH: 31.2 pg (ref 26.6–33.0)
MCHC: 32.8 g/dL (ref 31.5–35.7)
MCV: 95 fL (ref 79–97)
Monocytes Absolute: 0.7 x10E3/uL (ref 0.1–0.9)
Monocytes: 10 %
Neutrophils Absolute: 3.9 x10E3/uL (ref 1.4–7.0)
Neutrophils: 55 %
Platelets: 285 x10E3/uL (ref 150–450)
RBC: 4.2 x10E6/uL (ref 3.77–5.28)
RDW: 12.1 % (ref 11.7–15.4)
WBC: 7 x10E3/uL (ref 3.4–10.8)

## 2024-06-27 LAB — CMP14+EGFR
ALT: 11 IU/L (ref 0–32)
AST: 17 IU/L (ref 0–40)
Albumin: 4.4 g/dL (ref 3.8–4.8)
Alkaline Phosphatase: 80 IU/L (ref 49–135)
BUN/Creatinine Ratio: 19 (ref 12–28)
BUN: 21 mg/dL (ref 8–27)
Bilirubin Total: 0.5 mg/dL (ref 0.0–1.2)
CO2: 22 mmol/L (ref 20–29)
Calcium: 9.9 mg/dL (ref 8.7–10.3)
Chloride: 102 mmol/L (ref 96–106)
Creatinine, Ser: 1.08 mg/dL — ABNORMAL HIGH (ref 0.57–1.00)
Globulin, Total: 2.1 g/dL (ref 1.5–4.5)
Glucose: 138 mg/dL — ABNORMAL HIGH (ref 70–99)
Potassium: 4.4 mmol/L (ref 3.5–5.2)
Sodium: 139 mmol/L (ref 134–144)
Total Protein: 6.5 g/dL (ref 6.0–8.5)
eGFR: 54 mL/min/1.73 — ABNORMAL LOW

## 2024-06-27 LAB — BAYER DCA HB A1C WAIVED: HB A1C (BAYER DCA - WAIVED): 7.9 % — ABNORMAL HIGH (ref 4.8–5.6)

## 2024-06-27 MED ORDER — DIAZEPAM 2 MG PO TABS: 2.0000 mg | ORAL_TABLET | Freq: Four times a day (QID) | ORAL | 1 refills | Status: AC | PRN

## 2024-06-27 MED ORDER — DAPAGLIFLOZIN PROPANEDIOL 10 MG PO TABS: 10.0000 mg | ORAL_TABLET | Freq: Every day | ORAL | 1 refills | Status: DC

## 2024-06-27 MED ORDER — RAMIPRIL 10 MG PO CAPS: 10.0000 mg | ORAL_CAPSULE | Freq: Every day | ORAL | 1 refills | Status: AC

## 2024-06-27 MED ORDER — OMEPRAZOLE 40 MG PO CPDR: 40.0000 mg | DELAYED_RELEASE_CAPSULE | Freq: Every day | ORAL | 1 refills | Status: AC

## 2024-06-27 MED ORDER — ROSUVASTATIN CALCIUM 20 MG PO TABS: 20.0000 mg | ORAL_TABLET | Freq: Every day | ORAL | 1 refills | Status: AC

## 2024-06-27 MED ORDER — METFORMIN HCL 1000 MG PO TABS: 1000.0000 mg | ORAL_TABLET | Freq: Every day | ORAL | 1 refills | Status: AC

## 2024-06-27 MED ORDER — METOPROLOL SUCCINATE ER 25 MG PO TB24: 25.0000 mg | ORAL_TABLET | Freq: Every day | ORAL | 1 refills | Status: AC

## 2024-06-27 NOTE — Progress Notes (Signed)
 "  Subjective:    Patient ID: Denise Meadows, female    DOB: Aug 11, 1950, 73 y.o.   MRN: [id number]   Chief Complaint: medical management of chronic issues      HPI:  Denise Meadows is a 73 y.o. who identifies as a female who was assigned female at birth.   Social history: Lives with: husband Work history: retired- does a lot of traveling   Comes in today for follow up of the following chronic medical issues:  1. Primary hypertension No c/o chest pain, sob or headache. Does not check blood pressure at home. BP Readings from Last 3 Encounters:  04/11/24 124/63  02/21/24 136/68  01/04/24 136/66     2. Aortic atherosclerosis (HCC) Is doing well. Does not see cardiology.  3. Gastroesophageal reflux disease, unspecified whether esophagitis present Takes omeprazole  as needed  4. Hyperlipidemia associated with type 2 diabetes mellitus (HCC) Does try to watch diet but does no dedicated exercise. Lab Results  Component Value Date   CHOL 142 01/04/2024   HDL 51 01/04/2024   LDLCALC 57 01/04/2024   TRIG 213 (H) 01/04/2024   CHOLHDL 2.8 01/04/2024     5. Diabetes mellitus treated with oral medication (HCC) Does not check blood sugars at home Lab Results  Component Value Date   HGBA1C 7.6 (H) 01/04/2024     6. Stage 3a chronic kidney disease (HCC) No voiding issues Lab Results  Component Value Date   CREATININE 1.30 (H) 01/04/2024     7. Panic attacks Is on  valium  and is doing well.    06/27/2024    9:42 AM 01/04/2024    9:51 AM 09/22/2022    8:12 AM 04/20/2022    9:02 AM  GAD 7 : Generalized Anxiety Score  Nervous, Anxious, on Edge 0 0 0 0  Control/stop worrying 0 0 0 0  Worry too much - different things 0 0 0 0  Trouble relaxing 0 0 0 0  Restless 0 0 0 0  Easily annoyed or irritable 0 0 0 0  Afraid - awful might happen 0 0 0 0  Total GAD 7 Score 0 0 0 0  Anxiety Difficulty Not difficult at all Not difficult at all Not difficult at all Not difficult at  all      8.  Insomnia, unspecified type No issues  9. Rosacea Is on metrogel  and that is working well for her.  10. Vitamin D  deficiency Is on daily vitamin d  supplement  11. Osteoporosis Last dexascan was done on 03/26/23. T score was -1.7. does not do any weight bearing exercises.   12. BMI 30.0-30.9,adult No recent weight changes   Wt Readings from Last 3 Encounters:  06/27/24 154 lb (69.9 kg)  04/11/24 154 lb 3.2 oz (69.9 kg)  02/21/24 156 lb (70.8 kg)   BMI Readings from Last 3 Encounters:  06/27/24 29.10 kg/m  04/11/24 29.14 kg/m  02/21/24 29.48 kg/m         New complaints: None today  Allergies  Allergen Reactions   Acetaminophen Nausea And Vomiting   Codeine Nausea And Vomiting   Penicillins Rash   Outpatient Encounter Medications as of 06/27/2024  Medication Sig   Accu-Chek Softclix Lancets lancets Test BS daily Dx E11.9   Blood Glucose Monitoring Suppl (ACCU-CHEK GUIDE ME) w/Device KIT Test BS daily Dx E11.9   Cholecalciferol (VITAMIN D ) 2000 units tablet Take 1 tablet (2,000 Units total) by mouth daily.   dapagliflozin  propanediol (FARXIGA ) 10  MG TABS tablet Take 1 tablet (10 mg total) by mouth daily before breakfast.   diazepam  (VALIUM ) 2 MG tablet Take 1 tablet (2 mg total) by mouth every 6 (six) hours as needed for anxiety.   glucose blood (ACCU-CHEK GUIDE TEST) test strip Test BS daily Dx E11.9   magnesium gluconate (MAGONATE) 500 MG tablet Take 400 mg by mouth 2 (two) times daily.   metFORMIN  (GLUCOPHAGE ) 1000 MG tablet Take 1 tablet (1,000 mg total) by mouth daily with breakfast.   metoprolol  succinate (TOPROL -XL) 25 MG 24 hr tablet TAKE 1 TABLET BY MOUTH DAILY   metroNIDAZOLE  (METROGEL ) 0.75 % gel Apply 1 application topically as needed.   Multiple Vitamin (MULTIVITAMIN WITH MINERALS) TABS tablet Take 1 tablet by mouth daily.   Omega-3 Fatty Acids (FISH OIL ) 1000 MG CAPS Take 1 capsule (1,000 mg total) by mouth 2 (two) times daily.    omeprazole  (PRILOSEC) 40 MG capsule TAKE 1 CAPSULE BY MOUTH DAILY   predniSONE  (DELTASONE ) 10 MG tablet Take 1 tablet (10 mg total) by mouth daily with breakfast.   ramipril  (ALTACE ) 10 MG capsule TAKE 1 CAPSULE BY MOUTH DAILY   rosuvastatin  (CRESTOR ) 20 MG tablet TAKE 1 TABLET BY MOUTH DAILY   triamcinolone  cream (KENALOG ) 0.5 % Apply topically 2 (two) times daily.   vitamin B-12 (CYANOCOBALAMIN ) 100 MCG tablet Take 1 tablet (100 mcg total) by mouth daily.   No facility-administered encounter medications on file as of 06/27/2024.    Past Surgical History:  Procedure Laterality Date   BREAST CYST ASPIRATION Left 2005   BREAST SURGERY     benign breast nodules 2005   CHOLECYSTECTOMY     COLONOSCOPY  2008   Dr Medoff-Normal   DILATION AND CURETTAGE, DIAGNOSTIC / THERAPEUTIC     ESOPHAGOGASTRODUODENOSCOPY     FRACTURE SURGERY     MALONEY DILATION  02/08/2012   Procedure: MALONEY DILATION;  Surgeon: Margo LITTIE Haddock, MD;  [facility]: [facility];  Service: Endoscopy;  Laterality: N/A;   SAVORY DILATION  02/08/2012   Procedure: SAVORY DILATION;  Surgeon: Margo LITTIE Haddock, MD;  Location: [facility];  Service: Endoscopy;  Laterality: N/A;   SPINE SURGERY      Family History  Problem Relation Age of Onset   Hyperlipidemia Mother    Osteoarthritis Mother    COPD Father    Colon cancer Maternal Aunt    Colon cancer Maternal Grandfather    Scoliosis Son    Heart disease Neg Hx    Breast cancer Neg Hx       Controlled substance contract: n/a     Review of Systems  Constitutional:  Negative for diaphoresis.  Eyes:  Negative for pain.  Respiratory:  Negative for shortness of breath.   Cardiovascular:  Negative for chest pain, palpitations and leg swelling.  Gastrointestinal:  Negative for abdominal pain.  Endocrine: Negative for polydipsia.  Skin:  Negative for rash.  Neurological:  Negative for dizziness, weakness and headaches.  Hematological:  Does not bruise/bleed easily.   All other systems reviewed and are negative.      Objective:   Physical Exam Vitals and nursing note reviewed.  Constitutional:      General: She is not in acute distress.    Appearance: Normal appearance. She is well-developed.  HENT:     Head: Normocephalic.     Right Ear: Tympanic membrane normal.     Left Ear: Tympanic membrane normal.     Nose: Nose normal.  Mouth/Throat:     Mouth: Mucous membranes are moist.  Eyes:     Pupils: Pupils are equal, round, and reactive to light.  Neck:     Vascular: No carotid bruit or JVD.  Cardiovascular:     Rate and Rhythm: Normal rate and regular rhythm.     Heart sounds: Normal heart sounds.  Pulmonary:     Effort: Pulmonary effort is normal. No respiratory distress.     Breath sounds: Normal breath sounds. No wheezing or rales.  Chest:     Chest wall: No tenderness.  Abdominal:     General: Bowel sounds are normal. There is no distension or abdominal bruit.     Palpations: Abdomen is soft. There is no hepatomegaly, splenomegaly, mass or pulsatile mass.     Tenderness: There is no abdominal tenderness.  Musculoskeletal:        General: Normal range of motion.     Cervical back: Normal range of motion and neck supple.  Lymphadenopathy:     Cervical: No cervical adenopathy.  Skin:    General: Skin is warm and dry.  Neurological:     Mental Status: She is alert and oriented to person, place, and time.     Deep Tendon Reflexes: Reflexes are normal and symmetric.  Psychiatric:        Behavior: Behavior normal.        Thought Content: Thought content normal.        Judgment: Judgment normal.    BP 112/68   Pulse 63   Temp (!) 97.1 F (36.2 C) (Temporal)   Ht 5' 1 (1.549 m)   Wt 154 lb (69.9 kg)   SpO2 99%   BMI 29.10 kg/m   HGBA1c 7.9%     Assessment & Plan:  Denise Meadows comes in today with chief complaint of medical management of chronic issues    Diagnosis and orders addressed:  1. Primary  hypertension Low sodium diet - CBC with Differential/Platelet - CMP14+EGFR - metoprolol  succinate (TOPROL -XL) 25 MG 24 hr tablet; Take 1 tablet (25 mg total) by mouth daily.  Dispense: 100 tablet; Refill: 1 - ramipril  (ALTACE ) 10 MG capsule; Take 1 capsule (10 mg total) by mouth daily.  Dispense: 100 capsule; Refill: 1  2. Aortic atherosclerosis (HCC)  3. Gastroesophageal reflux disease, unspecified whether esophagitis present Avoid spicy foods Do not eat 2 hours prior to bedtime  - omeprazole  (PRILOSEC) 40 MG capsule; Take 1 capsule (40 mg total) by mouth daily as needed.  Dispense: 100 capsule; Refill: 1  4. Hyperlipidemia associated with type 2 diabetes mellitus (HCC) Low fat diet - Lipid panel - simvastatin  (ZOCOR ) 20 MG tablet; Take 1 tablet (20 mg total) by mouth at bedtime.  Dispense: 100 tablet; Refill: 1  5. Diabetes mellitus treated with oral medication (HCC) Continue to watch carbs in diet - Bayer DCA Hb A1c Waived - metoprolol  succinate (TOPROL -XL) 25 MG 24 hr tablet; Take 1 tablet (25 mg total) by mouth daily.  Dispense: 100 tablet; Refill: 1 - metFORMIN  (GLUCOPHAGE ) 1000 MG tablet; Take 1 tablet (1,000 mg total) by mouth daily with breakfast.  Dispense: 100 tablet; Refill: 1  6. Stage 3a chronic kidney disease (HCC) Labs pending - dapagliflozin  propanediol (FARXIGA ) 10 MG TABS tablet; Take 1 tablet (10 mg total) by mouth daily before breakfast.  Dispense: 90 tablet; Refill: 1  7. Panic attacks - diazepam  (VALIUM ) 2 MG tablet; Take 1 tablet (2 mg total) by mouth every 6 (six) hours  as needed for anxiety.  Dispense: 20 tablet; Refill: 1  8. Insomnia, unspecified type Bedtime routine  9. Rosacea  10. Vitamin D  deficiency Continue vitamin d  supplement  11. BMI 30.0-30.9,adult Discussed diet and exercise for person with BMI >25 Will recheck weight in 3-6 months  Labs pending Health Maintenance reviewed Diet and exercise encouraged  Follow up plan: 3  months   Mary-Margaret Gladis, FNP  "

## 2024-06-27 NOTE — Telephone Encounter (Signed)
 Cancelled Farxiga  at mail order pharmacy. Can this med be sent in to needed place for patient assistance?

## 2024-06-27 NOTE — Telephone Encounter (Signed)
 Copied from CRM #[id number]. Topic: Clinical - Prescription Issue >> Jun 27, 2024  1:57 PM Selinda RAMAN wrote: Reason for CRM: The patient called stating she saw her provider today and her refills all went to [facility]. She said that all of them but Farxiga  should go to [facility]. Please stop the Farxiga  from going to [facility] as that is too expensive with them. She says to tell [place] and make sure the Farxiga  goes straight to the company   [facility] - [place], [place] - [address]  Phone: [phone] Fax: [phone]  As she gets it free through them. Please assist patient further and let her know this has been taken care of if possible.

## 2024-06-27 NOTE — Patient Instructions (Signed)

## 2024-06-30 ENCOUNTER — Ambulatory Visit: Payer: Self-pay | Admitting: Nurse Practitioner

## 2024-07-03 ENCOUNTER — Ambulatory Visit: Admitting: Nurse Practitioner

## 2024-07-14 ENCOUNTER — Telehealth: Payer: Self-pay | Admitting: Pharmacist

## 2024-07-14 DIAGNOSIS — E119 Type 2 diabetes mellitus without complications: Secondary | ICD-10-CM

## 2024-07-14 MED ORDER — DAPAGLIFLOZIN PROPANEDIOL 10 MG PO TABS: 10.0000 mg | ORAL_TABLET | Freq: Every day | ORAL | 3 refills | Status: AC

## 2024-07-14 NOTE — Progress Notes (Unsigned)
" ° °  [place] for Farxiga .  Updated RX escribed to [facility] mail order (pharmacy for [place] patient [place]).  Patient is stable on current regimen. RX for Farxiga  was cancelled at [facility] mail order pharmacy.  Gwynne Kemnitz Dattero Charnese Federici, PharmD, BCACP, CPP Clinical Pharmacist, [facility]   "

## 2024-07-14 NOTE — Telephone Encounter (Signed)
" °  °  Patient [place] in the [place] patient [place] for Farxiga .  Updated RX escribed to [facility] mail order (pharmacy for [place]).  Patient is stable on current regimen. RX for Farxiga  was cancelled at [facility] mail order pharmacy.  Gwynne Kemnitz Dattero Charnese Federici, PharmD, BCACP, CPP Clinical Pharmacist, [facility]   "

## 2024-07-14 NOTE — Telephone Encounter (Signed)
 RN cancelled farxiga  at mail order [facility]

## 2024-07-14 NOTE — Telephone Encounter (Signed)
 I will call in the farxiga  to [facility].  Patient can call to cancel the farxiga  at her mail order pharmacy.  She can just tell them to not ship.  I would leave it on file with them though.

## 2024-07-26 ENCOUNTER — Encounter: Payer: Self-pay | Admitting: *Deleted

## 2024-07-28 ENCOUNTER — Ambulatory Visit: Attending: Internal Medicine | Admitting: Internal Medicine

## 2024-07-28 ENCOUNTER — Encounter: Payer: Self-pay | Admitting: Internal Medicine

## 2024-07-28 VITALS — BP 122/68 | HR 72 | Ht 61.0 in | Wt 155.8 lb

## 2024-07-28 DIAGNOSIS — E782 Mixed hyperlipidemia: Secondary | ICD-10-CM

## 2024-07-28 DIAGNOSIS — I10 Essential (primary) hypertension: Secondary | ICD-10-CM | POA: Diagnosis not present

## 2024-07-28 DIAGNOSIS — I5189 Other ill-defined heart diseases: Secondary | ICD-10-CM

## 2024-07-28 DIAGNOSIS — I2510 Atherosclerotic heart disease of native coronary artery without angina pectoris: Secondary | ICD-10-CM

## 2024-07-28 NOTE — Patient Instructions (Signed)
 Medication Instructions:  NO CHANGES *If you need a refill on your cardiac medications before your next appointment, please call your pharmacy*  Lab Work: NO LABS If you have labs (blood work) drawn today and your tests are completely normal, you will receive your results only by: MyChart Message (if you have MyChart) OR A paper copy in the mail If you have any lab test that is abnormal or we need to change your treatment, we will call you to review the results.  Testing/Procedures: NO TESTING  Follow-Up: At [facility], you and your health needs are our priority.  As part of our continuing mission to provide you with exceptional heart care, our providers are all part of one team.  This team includes your primary Cardiologist (physician) and Advanced Practice Providers or APPs (Physician Assistants and Nurse Practitioners) who all work together to provide you with the care you need, when you need it.  Your next appointment:   1 year(s)  Provider:   Emeline FORBES Calender, DO  Other Instructions

## 2024-07-28 NOTE — Progress Notes (Signed)
 " Cardiology Office Note:  .   Date:  07/28/2024  ID:  Denise Meadows, DOB 1950-07-20, MRN [id number] PCP: Gladis Mustard, FNP  [facility] Providers Cardiologist:  Emeline FORBES Calender, DO Cardiology APP:  Percy Rosaline HERO, NP    History of Present Illness: .     Discussed the use of AI scribe software for clinical note transcription with the patient, who gave verbal consent to proceed.  History of Present Illness Denise Meadows is a 74 year old female with hypertension, hyperlipidemia, aortic atherosclerosis, type 2 diabetes, and NASH who presents for an annual follow-up. She was previously followed by Doctor Hobart.  Chest pain - Occasional sharp pain localized to the lower chest - Occurs while sitting, lying down, or walking - No specific trigger identified - Pain occurs infrequently - No associated symptoms of cough or respiratory distress  Cardiovascular risk factors and history - Hypertension, hyperlipidemia, aortic atherosclerosis, type 2 diabetes mellitus, and nonalcoholic steatohepatitis (NASH) - Diastolic dysfunction with echocardiogram in January 2020 showing ejection fraction 60-65% and grade 2 diastolic dysfunction - Coronary CTA in September 2023 with calcium  score of 46 (66th percentile), minimal and nonobstructive coronary disease in proximal LAD - Chronic kidney disease stage 3  Diabetes mellitus - HbA1c 7.9  Hyperlipidemia - Lipid panel from December 2025: total cholesterol 145, HDL 49, triglycerides 177, LDL 66 - Currently taking rosuvastatin  20 mg, recently refilled  Medication regimen - Farxiga  10 mg - Metoprolol  succinate 25 mg - Ramipril  10 mg - Rosuvastatin  20 mg  Tobacco use - Quit smoking in 1995 after a family member's health scare - No tobacco use since 1995          ROS: Remaining review of systems negative  Studies Reviewed: SABRA   EKG Interpretation Date/Time:  Friday July 28 2024 08:13:16 EST Ventricular Rate:  59 PR  Interval:  144 QRS Duration:  116 QT Interval:  426 QTC Calculation: 421 R Axis:   -46  Text Interpretation: Sinus bradycardia Pulmonary disease pattern Right bundle branch block Left anterior fascicular block Bifascicular block Minimal voltage criteria for LVH, may be normal variant ( R in aVL ) When compared with ECG of 29-Jul-2023 08:00, Premature ventricular complexes are no longer Present QT has shortened Confirmed by Calender Emeline [phone]) on 07/28/2024 8:22:25 AM    Results Labs HbA1c: 7.9 Lipid panel (06/27/2024): Total cholesterol 145, HDL 49, triglycerides 177, LDL 66  Radiology Coronary artery calcium  score (2020): Calcium  score 46, 66th percentile for age and sex Coronary CTA (03/29/2019): Normal coronary origin with right dominance, minimal and nonobstructive coronary disease in proximal LAD  Diagnostic Echocardiogram (07/26/2018): Left ventricular ejection fraction 60-65%, no regional wall motion abnormalities, grade 2 diastolic dysfunction Risk Assessment/Calculations:             Physical Exam:   VS:  BP 122/68   Pulse 72   Ht 5' 1 (1.549 m)   Wt 155 lb 12.8 oz (70.7 kg)   SpO2 97%   BMI 29.44 kg/m    Wt Readings from Last 3 Encounters:  07/28/24 155 lb 12.8 oz (70.7 kg)  06/27/24 154 lb (69.9 kg)  04/11/24 154 lb 3.2 oz (69.9 kg)    GEN: Well nourished, well developed in no acute distress NECK: No JVD; No carotid bruits CARDIAC:  RRR, no murmurs, no rubs, no gallops RESPIRATORY:  Clear to auscultation without rales, wheezing or rhonchi  ABDOMEN: Soft, non-tender, non-distended EXTREMITIES:  No edema; No deformity  ASSESSMENT AND PLAN: .    Assessment and Plan Assessment & Plan Primary hypertension Hypertension is managed with current medication regimen. - Continue metoprolol  succinate 25 mg daily. - Continue ramipril  10 mg daily.  Hyperlipidemia Well-controlled with current medication regimen. Recent lipid panel shows total cholesterol 145 mg/dL,  HDL 49 mg/dL, triglycerides 822 mg/dL, and LDL 66 mg/dL. - Continue rosuvastatin  20 mg daily.  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 Calcium  score of 46 places her at the 66th percentile, indicating mild plaque burden. No significant coronary artery disease requiring intervention. - Continue current management with statin therapy.  Diastolic dysfunction Grade 2 diastolic dysfunction noted on echocardiogram. Ejection fraction is 60-65%. - Continue current management. - On Farxiga  10 mg  Mildly elevated calcium  score  Noncardiac chest pain Coronary CTA in 2020 with low risk findings and pain is left-sided and occurs independently of exertion and brief            Follow up: 1 year  Signed, Emeline FORBES Calender, DO  07/28/2024 8:35 AM     [facility] "

## 2024-08-04 NOTE — Progress Notes (Signed)
 Denise Meadows                                          MRN: [id number]   08/04/2024   The [facility] Quality Team Specialist reviewed this patient medical record for the purposes of chart review for care gap closure. The following were reviewed: abstraction for care gap closure-glycemic status assessment.    VBCI Quality Team

## 2024-09-25 ENCOUNTER — Ambulatory Visit: Admitting: Nurse Practitioner

## 2024-09-29 ENCOUNTER — Ambulatory Visit: Admitting: Nurse Practitioner

## 2025-02-21 ENCOUNTER — Ambulatory Visit: Payer: Self-pay

## 2025-02-22 ENCOUNTER — Ambulatory Visit
